# Patient Record
Sex: Female | Born: 1965 | Race: White | Hispanic: No | Marital: Married | Smoking: Former smoker
Health system: Southern US, Community
[De-identification: ages and names within clinical notes are randomized; demographics above are authoritative.]

## PROBLEM LIST (undated history)

## (undated) DIAGNOSIS — R000 Tachycardia, unspecified: Secondary | ICD-10-CM

## (undated) DIAGNOSIS — K760 Fatty (change of) liver, not elsewhere classified: Secondary | ICD-10-CM

## (undated) DIAGNOSIS — K219 Gastro-esophageal reflux disease without esophagitis: Secondary | ICD-10-CM

## (undated) DIAGNOSIS — G8929 Other chronic pain: Secondary | ICD-10-CM

## (undated) DIAGNOSIS — K2210 Ulcer of esophagus without bleeding: Secondary | ICD-10-CM

## (undated) DIAGNOSIS — F419 Anxiety disorder, unspecified: Secondary | ICD-10-CM

## (undated) DIAGNOSIS — L0390 Cellulitis, unspecified: Secondary | ICD-10-CM

## (undated) DIAGNOSIS — D649 Anemia, unspecified: Secondary | ICD-10-CM

## (undated) DIAGNOSIS — T7840XA Allergy, unspecified, initial encounter: Secondary | ICD-10-CM

## (undated) DIAGNOSIS — R51 Headache: Secondary | ICD-10-CM

## (undated) DIAGNOSIS — E669 Obesity, unspecified: Secondary | ICD-10-CM

## (undated) DIAGNOSIS — E119 Type 2 diabetes mellitus without complications: Secondary | ICD-10-CM

## (undated) DIAGNOSIS — M1990 Unspecified osteoarthritis, unspecified site: Secondary | ICD-10-CM

## (undated) DIAGNOSIS — J189 Pneumonia, unspecified organism: Secondary | ICD-10-CM

## (undated) DIAGNOSIS — N200 Calculus of kidney: Secondary | ICD-10-CM

## (undated) DIAGNOSIS — G709 Myoneural disorder, unspecified: Secondary | ICD-10-CM

## (undated) DIAGNOSIS — C801 Malignant (primary) neoplasm, unspecified: Secondary | ICD-10-CM

## (undated) DIAGNOSIS — R748 Abnormal levels of other serum enzymes: Secondary | ICD-10-CM

## (undated) DIAGNOSIS — K589 Irritable bowel syndrome without diarrhea: Secondary | ICD-10-CM

## (undated) DIAGNOSIS — K5792 Diverticulitis of intestine, part unspecified, without perforation or abscess without bleeding: Secondary | ICD-10-CM

## (undated) DIAGNOSIS — R519 Headache, unspecified: Secondary | ICD-10-CM

## (undated) DIAGNOSIS — E785 Hyperlipidemia, unspecified: Secondary | ICD-10-CM

## (undated) DIAGNOSIS — G932 Benign intracranial hypertension: Secondary | ICD-10-CM

## (undated) HISTORY — DX: Anemia, unspecified: D64.9

## (undated) HISTORY — PX: ANKLE SURGERY: SHX546

## (undated) HISTORY — DX: Allergy, unspecified, initial encounter: T78.40XA

## (undated) HISTORY — PX: ABDOMINAL HYSTERECTOMY: SHX81

## (undated) HISTORY — PX: TUBAL LIGATION: SHX77

## (undated) HISTORY — DX: Malignant (primary) neoplasm, unspecified: C80.1

## (undated) HISTORY — PX: FOOT SURGERY: SHX648

## (undated) HISTORY — PX: OTHER SURGICAL HISTORY: SHX169

## (undated) HISTORY — PX: TONSILLECTOMY: SUR1361

## (undated) HISTORY — DX: Gastro-esophageal reflux disease without esophagitis: K21.9

## (undated) HISTORY — DX: Cellulitis, unspecified: L03.90

## (undated) HISTORY — DX: Tachycardia, unspecified: R00.0

## (undated) HISTORY — PX: NASAL SEPTUM SURGERY: SHX37

## (undated) HISTORY — DX: Pneumonia, unspecified organism: J18.9

## (undated) HISTORY — DX: Anxiety disorder, unspecified: F41.9

## (undated) HISTORY — DX: Other chronic pain: G89.29

## (undated) HISTORY — DX: Calculus of kidney: N20.0

## (undated) HISTORY — PX: VAGINAL HYSTERECTOMY: SUR661

## (undated) HISTORY — DX: Obesity, unspecified: E66.9

## (undated) HISTORY — DX: Headache, unspecified: R51.9

## (undated) HISTORY — DX: Fatty (change of) liver, not elsewhere classified: K76.0

## (undated) HISTORY — DX: Headache: R51

## (undated) HISTORY — DX: Irritable bowel syndrome, unspecified: K58.9

## (undated) HISTORY — DX: Myoneural disorder, unspecified: G70.9

## (undated) HISTORY — DX: Ulcer of esophagus without bleeding: K22.10

## (undated) HISTORY — DX: Hyperlipidemia, unspecified: E78.5

## (undated) HISTORY — DX: Unspecified osteoarthritis, unspecified site: M19.90

## (undated) HISTORY — DX: Abnormal levels of other serum enzymes: R74.8

## (undated) HISTORY — DX: Type 2 diabetes mellitus without complications: E11.9

## (undated) HISTORY — DX: Diverticulitis of intestine, part unspecified, without perforation or abscess without bleeding: K57.92

---

## 1999-11-04 ENCOUNTER — Emergency Department (HOSPITAL_COMMUNITY): Admission: EM | Admit: 1999-11-04 | Discharge: 1999-11-04 | Payer: Self-pay | Admitting: Emergency Medicine

## 2000-02-04 ENCOUNTER — Emergency Department (HOSPITAL_COMMUNITY): Admission: EM | Admit: 2000-02-04 | Discharge: 2000-02-04 | Payer: Self-pay | Admitting: Emergency Medicine

## 2001-02-03 ENCOUNTER — Emergency Department (HOSPITAL_COMMUNITY): Admission: EM | Admit: 2001-02-03 | Discharge: 2001-02-04 | Payer: Self-pay | Admitting: Emergency Medicine

## 2001-11-05 ENCOUNTER — Emergency Department (HOSPITAL_COMMUNITY): Admission: EM | Admit: 2001-11-05 | Discharge: 2001-11-06 | Payer: Self-pay | Admitting: *Deleted

## 2004-12-18 ENCOUNTER — Emergency Department (HOSPITAL_COMMUNITY): Admission: EM | Admit: 2004-12-18 | Discharge: 2004-12-18 | Payer: Self-pay | Admitting: Emergency Medicine

## 2005-04-03 ENCOUNTER — Emergency Department (HOSPITAL_COMMUNITY): Admission: EM | Admit: 2005-04-03 | Discharge: 2005-04-03 | Payer: Self-pay | Admitting: Emergency Medicine

## 2006-11-06 ENCOUNTER — Emergency Department (HOSPITAL_COMMUNITY): Admission: EM | Admit: 2006-11-06 | Discharge: 2006-11-06 | Payer: Self-pay | Admitting: Emergency Medicine

## 2006-11-08 ENCOUNTER — Ambulatory Visit (HOSPITAL_COMMUNITY): Admission: RE | Admit: 2006-11-08 | Discharge: 2006-11-08 | Payer: Self-pay | Admitting: Emergency Medicine

## 2008-04-19 ENCOUNTER — Encounter: Payer: Self-pay | Admitting: Internal Medicine

## 2009-03-23 ENCOUNTER — Encounter: Payer: Self-pay | Admitting: Internal Medicine

## 2009-04-03 ENCOUNTER — Encounter: Payer: Self-pay | Admitting: Internal Medicine

## 2009-04-05 ENCOUNTER — Encounter (INDEPENDENT_AMBULATORY_CARE_PROVIDER_SITE_OTHER): Payer: Self-pay | Admitting: *Deleted

## 2009-04-10 ENCOUNTER — Encounter: Payer: Self-pay | Admitting: Internal Medicine

## 2009-05-23 ENCOUNTER — Encounter: Payer: Self-pay | Admitting: Internal Medicine

## 2009-05-29 ENCOUNTER — Ambulatory Visit: Payer: Self-pay | Admitting: Internal Medicine

## 2009-05-29 DIAGNOSIS — K589 Irritable bowel syndrome without diarrhea: Secondary | ICD-10-CM | POA: Insufficient documentation

## 2009-05-29 DIAGNOSIS — K7689 Other specified diseases of liver: Secondary | ICD-10-CM

## 2009-05-29 DIAGNOSIS — K219 Gastro-esophageal reflux disease without esophagitis: Secondary | ICD-10-CM

## 2009-06-09 ENCOUNTER — Ambulatory Visit: Payer: Self-pay | Admitting: Internal Medicine

## 2009-06-09 HISTORY — PX: OTHER SURGICAL HISTORY: SHX169

## 2009-06-09 LAB — CONVERTED CEMR LAB: Anti Nuclear Antibody(ANA): NEGATIVE

## 2009-06-13 LAB — CONVERTED CEMR LAB
ALT: 72 units/L — ABNORMAL HIGH (ref 0–35)
AST: 61 units/L — ABNORMAL HIGH (ref 0–37)
Albumin: 3.8 g/dL (ref 3.5–5.2)
Alkaline Phosphatase: 73 units/L (ref 39–117)
Bilirubin, Direct: 0.1 mg/dL (ref 0.0–0.3)
Ferritin: 67.3 ng/mL (ref 10.0–291.0)
Total Bilirubin: 0.6 mg/dL (ref 0.3–1.2)
Total Protein: 6.7 g/dL (ref 6.0–8.3)

## 2009-06-19 HISTORY — PX: COLONOSCOPY: SHX174

## 2009-08-11 ENCOUNTER — Ambulatory Visit: Payer: Self-pay | Admitting: Internal Medicine

## 2010-06-03 ENCOUNTER — Encounter: Payer: Self-pay | Admitting: Emergency Medicine

## 2010-06-12 NOTE — Letter (Signed)
Summary: [facility] Instructions  [facility]  [place], [place] [address]   Phone: [phone]  Fax: [phone]       BLAYRE PAPANIA    [birth year]    MRN: [id number]        Procedure Day /Date:FRIDAY 06/09/2009     Arrival Time:1PM     Procedure Time:2PM     Location of Procedure:                    X   [facility] (4th Floor)                        PREPARATION FOR COLONOSCOPY WITH MOVIPREP/EGD   Starting 5 days prior to your procedure 06/04/2009 do not eat nuts, seeds, popcorn, corn, beans, peas,  salads, or any raw vegetables.  Do not take any fiber supplements (e.g. Metamucil, Citrucel, and Benefiber).  THE DAY BEFORE YOUR PROCEDURE         DATE: 06/08/2009  DAY: THURSDAY  1.  Drink clear liquids the entire day-NO SOLID FOOD  2.  Do not drink anything colored red or purple.  Avoid juices with pulp.  No orange juice.  3.  Drink at least 64 oz. (8 glasses) of fluid/clear liquids during the day to prevent dehydration and help the prep work efficiently.  CLEAR LIQUIDS INCLUDE: Water Jello Ice Popsicles Tea (sugar ok, no milk/cream) Powdered fruit flavored drinks Coffee (sugar ok, no milk/cream) Gatorade Juice: apple, white grape, white cranberry  Lemonade Clear bullion, consomm, broth Carbonated beverages (any kind) Strained chicken noodle soup Hard Candy                             4.  In the morning, mix first dose of MoviPrep solution:    Empty 1 Pouch A and 1 Pouch B into the disposable container    Add lukewarm drinking water to the top line of the container. Mix to dissolve    Refrigerate (mixed solution should be used within 24 hrs)  5.  Begin drinking the prep at 5:00 p.m. The MoviPrep container is divided by 4 marks.   Every 15 minutes drink the solution down to the next mark (approximately 8 oz) until the full liter is complete.   6.  Follow completed prep with 16 oz of clear liquid of your choice (Nothing red  or purple).  Continue to drink clear liquids until bedtime.  7.  Before going to bed, mix second dose of MoviPrep solution:    Empty 1 Pouch A and 1 Pouch B into the disposable container    Add lukewarm drinking water to the top line of the container. Mix to dissolve    Refrigerate  THE DAY OF YOUR PROCEDURE      DATE: 06/09/2009 DAY: FRIDAY  Beginning at 9a.m. (5 hours before procedure):         1. Every 15 minutes, drink the solution down to the next mark (approx 8 oz) until the full liter is complete.  2. Follow completed prep with 16 oz. of clear liquid of your choice.    3. You may drink clear liquids until 12:00PM (2 HOURS BEFORE PROCEDURE).   MEDICATION INSTRUCTIONS  Unless otherwise instructed, you should take regular prescription medications with a small sip of water   as early as possible the morning of your procedure.  OTHER INSTRUCTIONS  You will need a responsible adult at least 43 years of age to accompany you and drive you home.   This person must remain in the waiting room during your procedure.  Wear loose fitting clothing that is easily removed.  Leave jewelry and other valuables at home.  However, you may wish to bring a book to read or  an iPod/MP3 player to listen to music as you wait for your procedure to start.  Remove all body piercing jewelry and leave at home.  Total time from sign-in until discharge is approximately 2-3 hours.  You should go home directly after your procedure and rest.  You can resume normal activities the  day after your procedure.  The day of your procedure you should not:   Drive   Make legal decisions   Operate machinery   Drink alcohol   Return to work  You will receive specific instructions about eating, activities and medications before you leave.    The above instructions have been reviewed and explained to me by   _______________________    I fully understand and can verbalize these  instructions _____________________________ Date _________

## 2010-06-12 NOTE — Miscellaneous (Signed)
Summary: Orders Update  Clinical Lists Changes  Orders: Added new Test order of TLB-Hepatic/Liver Function Pnl (80076-HEPATIC) - Signed Added new Test order of TLB-Ferritin (82728-FER) - Signed Added new Test order of T-ANA ([id number]) - Signed

## 2010-06-12 NOTE — Letter (Signed)
Summary: [facility]   Imported By: Phillis Knack 05/31/2009 08:16:38  _____________________________________________________________________  External Attachment:    Type:   Image     Comment:   External Document

## 2010-06-12 NOTE — Assessment & Plan Note (Signed)
Summary: follow up EGD/sheri   History of Present Illness Visit Type: Follow-up Visit Primary GI MD: Silvano Rusk MD [facility] Primary Provider: Clayborn Heron Requesting Provider: Clayborn Heron Chief Complaint: GERD, IBS, Fatty Liver History of Present Illness:   Bowels are better. passed a kidney stone 1-2 weeks ago Compliant with medications and is eating a high fiber diet now. She is also now on Zocor for hyperlipidemia. She has lost 6#. Some coughing spells in the morning and she will regurgitate and even vomit small amounts of bilious fluid. She had to take blocks down because husband slipped out of bed. She plans to look for a wedge. She is taking Nexium before breakfast and after supper about 2 hours before bedtime.   GI Review of Systems    Reports acid reflux and  vomiting.      Denies abdominal pain, belching, bloating, chest pain, dysphagia with liquids, dysphagia with solids, heartburn, loss of appetite, nausea, vomiting blood, weight loss, and  weight gain.        Denies anal fissure, black tarry stools, change in bowel habit, constipation, diarrhea, diverticulosis, fecal incontinence, heme positive stool, hemorrhoids, irritable bowel syndrome, jaundice, light color stool, liver problems, rectal bleeding, and  rectal pain.    Current Medications (verified): 1)  Levbid 0.375 Mg  Tb12 (Hyoscyamine Sulfate) .... Take 1 Tablet By Mouth Two Times A Day As Needed 2)  Sumatriptan Succinate 100 Mg Tabs (Sumatriptan Succinate) .... Take 1/2 To 1 Tablet By Mouth Every 2 Hours Prn 3)  Fioricet 50-325-40 Mg Tabs (Butalbital-Apap-Caffeine) .... As Needed 4)  Aspir-Low 81 Mg Tbec (Aspirin) .... Once Daily 5)  Flax Seed Oil 1000 Mg Caps (Flaxseed (Linseed)) .... Once Daily 6)  Nexium 40 Mg Cpdr (Esomeprazole Magnesium) .Marland Kitchen.. 1 By Mouth Two Times A Day 7)  Zocor 40 Mg Tabs (Simvastatin) .Marland Kitchen.. 1 By Mouth Once Daily  Allergies (verified): 1)  ! * Anaprox  Past History:  Past Medical  History: GERD Anemia Anxiety Disorder Arthritis Asthma Chronic Headaches Diverticulosis Hyperlipidemia Irritable Bowel Syndrome Obesity Pneumonia Urinary Tract Infection Kidney Stones Fatty Liver  Past Surgical History: Reviewed history from 05/29/2009 and no changes required. Tonsillectomy Tubal Ligation Ovarian cysts x3 foot surgery Ankle surgery Deviated septum  Family History: Reviewed history from 05/29/2009 and no changes required. Family History of Colitis/Crohn's: Family History of Breast Cancer: Family History of Ovarian Cancer: Family History of Celiac Disease: Family History of Clotting disorder:  Family History of Cystic Fibrosis:  Family History of Diabetes:  Family History of Heart Disease:  Family History of Irritable Bowel Syndrome: Family History of Kidney Disease: No FH of Colon Cancer:  Social History: Reviewed history from 05/29/2009 and no changes required. 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  Alcohol Use - no Daily Caffeine Use Illicit Drug Use - no  Review of Systems       The patient complains of back pain, blood in urine, cough, and urination changes/pain.  The patient denies allergy/sinus, anemia, anxiety-new, arthritis/joint pain, breast changes/lumps, change in vision, confusion, coughing up blood, depression-new, fainting, fatigue, fever, headaches-new, hearing problems, heart murmur, heart rhythm changes, itching, menstrual pain, muscle pains/cramps, night sweats, nosebleeds, pregnancy symptoms, shortness of breath, skin rash, sleeping problems, sore throat, swelling of feet/legs, swollen lymph glands, thirst - excessive , urination - excessive , urine leakage, vision changes, and voice change.    Vital Signs:  Patient profile:   45 year old female Height:      62 inches Weight:  185 pounds BMI:     33.96 Pulse rate:   144 / minute Pulse rhythm:   regular BP sitting:   130 / 98  (left arm)  Vitals Entered By:  Randye Lobo NCMA (August 11, 2009 1:25 PM)  Physical Exam  General:  obese.  NAD   Impression & Recommendations:  Problem # 1:  GERD (ICD-530.81) Assessment Improved adjust Nexium to bid before meals and raise HOB again, PPI indefnitely if still coughing then to return that might be GERD but notcertain continue weight loss  Problem # 2:  IRRITABLE BOWEL SYNDROME (ICD-564.1) Assessment: Improved High Fiber diet and hyoscyamine are working  Problem # 3:  FATTY LIVER DISEASE (ICD-571.8) Assessment: Unchanged continue Zocor even if LFT's up 2-3x as this has been shown to help continue to lose weight  Patient Instructions: 1)  Take your Nexium 30-60 minutes before breakfast and supper. 2)  Try to elevate head of bed again, 4-6 inches. 3)  Please schedule a follow-up appointment as needed. If you continue to cough at night  after 2 months then return to Dr. Carlean Purl. 4)  Copy sent to : Margaretmary Eddy, MD 5)  The medication list was reviewed and reconciled.  All changed / newly prescribed medications were explained.  A complete medication list was provided to the patient / caregiver.

## 2010-06-12 NOTE — Procedures (Signed)
Summary: Upper Endoscopy w/DIL  Patient: Jacqueline [name] Note: All result statuses are Final unless otherwise noted.  Tests: (1) Upper Endoscopy w/DIL (UED)  UED Upper Endoscopy w/DIL                             [facility].     [place], [place]  [address]           ENDOSCOPY PROCEDURE REPORT           PATIENT:  [name], [name]  MR#:  [id number]     BIRTHDATE:  [birth year], 43 yrs. old  GENDER:  female           ENDOSCOPIST:  Gatha Mayer, MD, [facility]           PROCEDURE DATE:  06/09/2009     PROCEDURE:  EGD with biopsy, Venia Minks Dilation of the Esophagus     ASA CLASS:  Class II     INDICATIONS:  1) dysphagia           MEDICATIONS:   Fentanyl 75 mcg IV, Versed 7 mg IV     TOPICAL ANESTHETIC:  Exactacain Spray           DESCRIPTION OF PROCEDURE:   After the risks benefits and     alternatives of the procedure were thoroughly explained, informed     consent was obtained.  The LB GIF-H180 [id number] endoscope was     introduced through the mouth and advanced to the second portion of     the duodenum, without limitations.  The instrument was slowly     withdrawn as the mucosa was carefully examined.     <<PROCEDUREIMAGES>>           An ulcer was found in the distal esophagus. 6-7 mm ulcer just     above Z-line (at 36 cm). With standard forceps, a biopsy was     obtained and sent to pathology.  Edema was found at the     gastroesophageal junction. Proximal gastric folds are edematous.     Moderate gastritis was found in the total stomach. Mottled,     erythematous mucosa with red spots, proximal>distal.  The     examination was otherwise normal.    Dilation was then performed     at the distal esophagus           1) Dilator:  Venia Minks  Size(s):  54 french     Resistance:  minimal  Heme:  yes           there was a transient suggestion of a ring and given that and     impact dysphagia, dilation performed     trace heme, distal esophagus had been  biopsied           COMPLICATIONS:  None           ENDOSCOPIC IMPRESSION:     1) Ulcer in the distal esophagus     2) Edema at the gastroesophageal junction           These findings most consistent with reflux esophagitis, await     pathology.           3) Moderate gastritis in the total stomach     4) Otherwise normal examination.           Maloney dilation performed due to dysphagia  and ? of ring (seen     transiently?)           RECOMMENDATIONS:     1) await biopsy results     Clear liquids until 4PM then soft diet. Normal diet tomorrow.     She may need to chnage PPI as has esophagitis despite bid     Nexium.     See colonoscopy report from today also.           REPEAT EXAM:  await pathology to determine need           Gatha Mayer, MD, [facility]           CC:  Jenna Luo, M.D.     The Patient,           n.     eSIGNED:   Gatha Mayer at 06/09/2009 02:51 PM           Dorice Lamas, [id number]  Note: An exclamation mark (!) indicates a result that was not dispersed into the flowsheet. Document Creation Date: 06/09/2009 2:51 PM _______________________________________________________________________  (1) Order result status: Final Collection or observation date-time: 06/09/2009 14:22 Requested date-time:  Receipt date-time:  Reported date-time:  Referring Physician:   Ordering Physician: Silvano Rusk [phone]) Specimen Source:  Source: Tawanna Cooler Order Number: [id number] Lab site:   Appended Document: Upper Endoscopy w/DIL she has not been totally compliant with Nexium per conversation in recovery

## 2010-06-12 NOTE — Assessment & Plan Note (Signed)
Summary: STOMACH PROBLEMS/ON&OFF DIARRHEA/FX HX OF CROHNS/YF   History of Present Illness Visit Type: Initial Consult Primary GI MD: Silvano Rusk MD [facility] Primary Provider: Clayborn Heron Requesting Provider: Clayborn Heron Chief Complaint: loose bowels History of Present Illness:   Alternating bowel habits very loose and urgent to "hard rocks".This has occured over past few months. Stools often loose after intial hard defecation. Tried Benefiber, helped a little. 3 doses - 1-2 tsp/day.  Weight down some and appetite off. Food, especially dry foods will stick in her esophagusa and only way to get relief will be to regurgitate. Alot of bloating. In 2004 or 2005 she was constipated, had an endoscopy and "pockets in small intesties", was given Zelnorm which helped constipation. Nexium controls heartburn most of the time. Hyoscyamine is helping her current symptoms somewhat. She is concered due to aunt and cousin with Crohn's. Hoarseness, ? from GERD. Also with elevated LFT's and fatty liver on ultrasound. She denies anxiety "in the past not now".   GI Review of Systems    Reports abdominal pain, acid reflux, belching, bloating, chest pain, dysphagia with liquids, dysphagia with solids, loss of appetite, nausea, vomiting, and  weight loss.      Denies heartburn, vomiting blood, and  weight gain.      Reports change in bowel habits, constipation, diarrhea, diverticulosis, and  irritable bowel syndrome.     Denies anal fissure, black tarry stools, fecal incontinence, heme positive stool, hemorrhoids, jaundice, light color stool, liver problems, rectal bleeding, and  rectal pain. Preventive Screening-Counseling & Management  Alcohol-Tobacco     Smoking Status: quit      Drug Use:  no.      Current Medications (verified): 1)  Levbid 0.375 Mg  Tb12 (Hyoscyamine Sulfate) .... Take 1 Tablet By Mouth Two Times A Day As Needed 2)  Nexium 40 Mg Cpdr (Esomeprazole Magnesium) .... Two Times A  Day 3)  Sumatriptan Succinate 100 Mg Tabs (Sumatriptan Succinate) .... Take 1/2 To 1 Tablet By Mouth Every 2 Hours Prn 4)  Fioricet 50-325-40 Mg Tabs (Butalbital-Apap-Caffeine) .... As Needed 5)  Aspir-Low 81 Mg Tbec (Aspirin) .... Once Daily 6)  Flax Seed Oil 1000 Mg Caps (Flaxseed (Linseed)) .... Once Daily  Allergies (verified): 1)  ! * Anaprox  Past History:  Past Medical History: GERD Anemia Anxiety Disorder Arthritis Asthma Chronic Headaches Diverticulosis Hyperlipidemia Irritable Bowel Syndrome Obesity Pneumonia Urinary Tract Infection  Past Surgical History: Tonsillectomy Tubal Ligation Ovarian cysts x3 foot surgery Ankle surgery Deviated septum  Family History: Family History of Colitis/Crohn's: Family History of Breast Cancer: Family History of Ovarian Cancer: Family History of Celiac Disease: Family History of Clotting disorder:  Family History of Cystic Fibrosis:  Family History of Diabetes:  Family History of Heart Disease:  Family History of Irritable Bowel Syndrome: Family History of Kidney Disease: No FH of Colon Cancer:  Social History: 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  Alcohol Use - no Daily Caffeine Use Illicit Drug Use - no Smoking Status:  quit Drug Use:  no  Review of Systems       The patient complains of allergy/sinus, arthritis/joint pain, back pain, cough, fatigue, muscle pains/cramps, nosebleeds, thirst - excessive, and voice change.         All other ROS negative except as per HPI.   Vital Signs:  Patient profile:   45 year old female Height:      62 inches Weight:      191 pounds BMI:  35.06 Pulse rate:   72 / minute Pulse rhythm:   regular BP sitting:   132 / 84  (left arm) Cuff size:   regular  Vitals Entered By: June McMurray [place] Deborra Medina) (May 29, 2009 1:49 PM)  Physical Exam  General:  obese.   Eyes:  PERRLA, no icterus. Mouth:  No deformity or lesions, dentition normal. Neck:   Supple; no masses or thyromegaly. Lungs:  Clear throughout to auscultation. Heart:  Regular rate and rhythm; no murmurs, rubs,  or bruits. Abdomen:  obese, striae, soft and mildly tender in epigastrium BS+, no palpable HSM Rectal:  deferred until time of colonoscopy.   Extremities:  no edema Neurologic:  Alert and  oriented x4; Cervical Nodes:  No significant cervical or supraclavicular adenopathy.  Psych:  Alert and cooperative. Normal mood and affect.   Impression & Recommendations:  Problem # 1:  CHANGE IN BOWELS (PJK-932.67) Assessment New Alternating bowel habits causing severe symptoms. Most likely IBS. Crohn's/IBD is in differential. ESR was mildly elevated in Nov. CBC ok. TTG Ab, IgA antigliadin  negative and IgA ok She has some family members (not close) with Crohn's. Has carried a dx of IBS x yrs. Fiber somewhat helpful but not much. Depending upon colonoscopy results, therapy may be MiraLax and continue hyoscyamine which helps.  Risks, benefits,and indications of endoscopic procedure(s) were reviewed with the patient and all questions answered.  Orders: Colon/Endo (Colon/Endo)  Problem # 2:  DYSPHAGIA (TIW-580.99) Assessment: New Has GERD ? Peptic stricture and/or active esophagitis - we have discussed possible esophagea dilation hoarseness and some heartburn despite Nexium Risks, benefits,and indications of endoscopic procedure(s) were reviewed with the patient and all questions answered.  Orders: Colon/Endo (Colon/Endo)  Problem # 3:  TRANSAMINASES, SERUM, ELEVATED (ICD-790.4) seems likely due to fatty liver AST 55 ALT 94 (results in after visit) with NL bili, alk phos acute hepatitis panel negative 11/11 [place] report reviewed 04/10/09 with enlarged fatty liver will reassess after endoscopic eval will need to discuss weight loss regardless (multiple benefits)  Patient Instructions: 1)  Your EGD/Colonoscopy is scheduled for 06/09/2009 at 2pm 2)  You can pick up  your MoviPrep from your pharmacy today alon with your Nexium prescription. 3)  We are requesting previous labs fom [place] 4)  The medication list was reviewed and reconciled.  All changed / newly prescribed medications were explained.  A complete medication list was provided to the patient / caregiver. Prescriptions: MOVIPREP 100 GM  SOLR (PEG-KCL-NACL-NASULF-NA ASC-C) As per prep instructions.  #1 x 0   Entered by:   Genella Mech CMA (AAMA)   Authorized by:   Gatha Mayer MD, [facility]   Signed by:   Genella Mech CMA ([place]) on 05/29/2009   Method used:   Electronically to        [facility]. [phone]* (retail)       [address], [place]  [address]       Ph: [phone]       Fax: [phone]   RxID:   [id number] NEXIUM 40 MG CPDR (ESOMEPRAZOLE MAGNESIUM) 1 by mouth two times a day  #60 x 3   Entered by:   Genella Mech CMA ([place])   Authorized by:   Gatha Mayer MD, [facility]   Signed by:   Genella Mech CMA ([place]) on 05/29/2009   Method used:   Electronically to        [facility]  [address] [phone]* (retail)       [address] [place]  [address]       Ph: [phone]       Fax: [phone]   RxID:   [id number]

## 2010-06-12 NOTE — Procedures (Signed)
Summary: Colonoscopy  Patient: Jacqueline [name] Note: All result statuses are Final unless otherwise noted.  Tests: (1) Colonoscopy (COL)   COL Colonoscopy           [facility].     [place], [place]  [address]           COLONOSCOPY PROCEDURE REPORT           PATIENT:  Jacqueline, [name]  MR#:  [id number]     BIRTHDATE:  07-10-1965, 43 yrs. old  GENDER:  female           ENDOSCOPIST:  Gatha Mayer, MD, [facility]     Referred by:  Jenna Luo, M.D.           PROCEDURE DATE:  06/09/2009     PROCEDURE:  Colonoscopy 544[address]     ASA CLASS:  Class II     INDICATIONS:  change in bowel habits           MEDICATIONS:   There was residual sedation effect present from     prior procedure., Fentanyl 25 mcg IV, Versed 5 mg           DESCRIPTION OF PROCEDURE:   After the risks benefits and     alternatives of the procedure were thoroughly explained, informed     consent was obtained.  Digital rectal exam was performed and     revealed no abnormalities.   The LB CF-H180AL [id number] endoscope     was introduced through the anus and advanced to the terminal ileum     which was intubated for a short distance, without limitations.     The quality of the prep was excellent, using MoviPrep.  The     instrument was then slowly withdrawn as the colon was fully     examined.     Insertion: 2:50 minutes, Withdrawal: 7:00 minutes     <<PROCEDUREIMAGES>>     FINDINGS:  Mild diverticulosis was found in the sigmoid colon.     This was otherwise a normal examination of the colon. Terminal     ileum was normal also.   Retroflexed views in the rectum revealed     no abnormalities.    The scope was then withdrawn from the pa     tient and the procedure completed.           COMPLICATIONS:  None           ENDOSCOPIC IMPRESSION:     1) Mild diverticulosis in the sigmoid colon     2) Otherwise normal examination, excellent prep     3) No evidence of Crohn's. I think problems are due  to Irritable     Bowle Syndrome.           RECOMMENDATIONS:     Take MiraLax (17 grams or 1 tablespoon) in 8 oz liquid daily.     Continue other medications.           Labs today LFT's, ANA, ferrtin re: abnormal LFT's           REPEAT EXAM:  In 10 year(s) for routine screening colonocsopy.           Gatha Mayer, MD, [facility]           CC:  Jenna Luo, MD     The Patient           n.  eSIGNED:   Gatha Mayer at 06/09/2009 03:02 PM           Dorice Lamas, [id number]  Note: An exclamation mark (!) indicates a result that was not dispersed into the flowsheet. Document Creation Date: 06/09/2009 3:02 PM _______________________________________________________________________  (1) Order result status: Final Collection or observation date-time: 06/09/2009 14:40 Requested date-time:  Receipt date-time:  Reported date-time:  Referring Physician:   Ordering Physician: Silvano Rusk [id number]) Specimen Source:  Source: Tawanna Cooler Order Number: [id number] Lab site:   Appended Document: Colonoscopy    Clinical Lists Changes  Observations: Added new observation of COLONNXTDUE: 05/2019 (06/09/2009 15:20)

## 2010-11-13 ENCOUNTER — Ambulatory Visit
Admission: RE | Admit: 2010-11-13 | Discharge: 2010-11-13 | Disposition: A | Discharge status: Home / Self Care | Payer: Medicare Other | Source: Ambulatory Visit | Attending: Family Medicine | Admitting: Family Medicine

## 2010-11-13 ENCOUNTER — Other Ambulatory Visit: Payer: Self-pay | Admitting: Family Medicine

## 2010-11-13 DIAGNOSIS — R1031 Right lower quadrant pain: Secondary | ICD-10-CM

## 2010-11-13 MED ORDER — IOHEXOL 300 MG/ML  SOLN: 100.0000 mL | Freq: Once | INTRAMUSCULAR | Status: AC | PRN

## 2011-02-27 LAB — URINALYSIS, ROUTINE W REFLEX MICROSCOPIC
Bilirubin Urine: NEGATIVE
Glucose, UA: NEGATIVE
Ketones, ur: NEGATIVE
Leukocytes, UA: NEGATIVE
Nitrite: NEGATIVE
Specific Gravity, Urine: >1.03 — ABNORMAL HIGH
pH: 6

## 2011-02-27 LAB — CBC
Hemoglobin: 14
MCHC: 34.7
MCV: 87.2
RBC: 4.63
RDW: 12.9

## 2011-02-27 LAB — COMPREHENSIVE METABOLIC PANEL
BUN: 12
CO2: 26
Calcium: 9
Creatinine, Ser: 0.77
GFR calc non Af Amer: >60
Glucose, Bld: 116 — ABNORMAL HIGH
Sodium: 138
Total Protein: 6.5

## 2011-02-27 LAB — LIPASE, BLOOD: Lipase: 29

## 2011-02-27 LAB — DIFFERENTIAL
Eosinophils Absolute: 0
Lymphocytes Relative: 30
Lymphs Abs: 3.3
Monocytes Relative: 7
Neutro Abs: 6.8
Neutrophils Relative %: 63

## 2011-02-27 LAB — URINE MICROSCOPIC-ADD ON

## 2011-02-27 LAB — POCT CARDIAC MARKERS
CKMB, poc: 3.6
CKMB, poc: 6

## 2011-02-27 LAB — D-DIMER, QUANTITATIVE: D-Dimer, Quant: 0.24

## 2011-02-27 LAB — TROPONIN I: Troponin I: 0.02

## 2011-02-27 LAB — CK TOTAL AND CKMB (NOT AT ARMC)
CK, MB: 1.1
Relative Index: INVALID

## 2011-08-29 ENCOUNTER — Other Ambulatory Visit (HOSPITAL_COMMUNITY): Payer: Self-pay | Admitting: *Deleted

## 2011-08-29 ENCOUNTER — Encounter (HOSPITAL_COMMUNITY)
Admission: RE | Admit: 2011-08-29 | Discharge: 2011-08-29 | Disposition: A | Discharge status: Home / Self Care | Payer: Medicare Other | Source: Ambulatory Visit | Attending: Family Medicine | Admitting: Family Medicine

## 2011-08-29 NOTE — Progress Notes (Signed)
Plebotomized 548m blood from left a/c without difficulty. Tolerated well. Patient drinking po's pre and post procedure.

## 2011-09-10 ENCOUNTER — Ambulatory Visit (INDEPENDENT_AMBULATORY_CARE_PROVIDER_SITE_OTHER): Payer: Medicare Other | Admitting: Internal Medicine

## 2011-09-10 ENCOUNTER — Encounter: Payer: Self-pay | Admitting: Internal Medicine

## 2011-09-10 VITALS — BP 106/72 | HR 88 | Ht 62.0 in | Wt 192.0 lb

## 2011-09-10 DIAGNOSIS — K625 Hemorrhage of anus and rectum: Secondary | ICD-10-CM

## 2011-09-10 DIAGNOSIS — K760 Fatty (change of) liver, not elsewhere classified: Secondary | ICD-10-CM

## 2011-09-10 DIAGNOSIS — K7689 Other specified diseases of liver: Secondary | ICD-10-CM

## 2011-09-10 DIAGNOSIS — R7989 Other specified abnormal findings of blood chemistry: Secondary | ICD-10-CM

## 2011-09-10 DIAGNOSIS — R748 Abnormal levels of other serum enzymes: Secondary | ICD-10-CM

## 2011-09-10 NOTE — Patient Instructions (Signed)
We will obtain the lab results from [facility].  If you have not heard from [place] in 3 weeks, please call our office at [phone]  We have given you some information on fatty liver to review

## 2011-09-10 NOTE — Progress Notes (Addendum)
Subjective:    Patient ID: Jacqueline [name], female    DOB: 1965-09-10, 46 y.o.   MRN: [id number]  HPI This pleasant 46 year old white woman is known to me from prior evaluation previously, was found to have fatty liver and also IBS. She had not been seen since 2011. More recently she was having problems with palpitations. She presented for evaluation her primary care provider and laboratory testing revealed elevated transaminases with an AST of 254 and an ALT of 189 on March 22. Further evaluation led to serologic workup an elevated ferritin of 705. Dr. Dennard Schaumann was appropriately concerned about the possibility of hemochromatosis. He wanted her evaluated for this possibility. According to the patient she has had a unit of blood flow by my, and just today had repeat liver tests and a hemochromatosis gene test. Her ANA is negative, her iron saturation is 33%. Antimitochondrial antibody IgG is negative.  In the past she had an acute hepatitis serology panel that was negative.  She also had diarrhea, that was transient and several days of bright red blood on the tissue paper only slight blood related to the diarrhea, she was diagnosed with diabetes mellitus recently and started on metformin and had some diarrhea when she initiated that. That has now resolved, both the diarrhea and the bleeding.  She is trying to avoid iron rich foods at this point and also tried with that to a diabetic diet and is struggling with that a little bit. Her mother has type 2 diabetes, her son was diagnosed with type I.5 diabetes in his 61s.  Medications, allergies, past medical history, past surgical history, family history and social history are reviewed and updated in the EMR.   Review of Systems As above.    Objective:   Physical Exam General:  NAD, obese Eyes:   anicteric Lungs:  clear Heart:  S1S2 no rubs, murmurs or gallops Abdomen:  soft and BS+, there are striae present. There is abdominal obesity present.  There is some tenderness in the right upper quadrant in the liver edge is palpable fingerbreadth below the costal margin. Ext:   no edema    Data Reviewed:   As per history of present illness, white count was 11, hemoglobin 14.7 platelets 391 on March 22. TSH was normal.       Assessment & Plan:   1. Elevated ferritin   I suspect this is most likely related to fatty liver disease. Await results of hemochromatosis gene testing, we have requested these from Dr. Dennard Schaumann. If that is positive a liver biopsy would be appropriate. Once I review the results, regardless of what they were we will have her come back for followup.   2. Fatty liver   I think it's fairly clear she has this, the question is how much inflammatory component. The rise in her transaminases suggest a flare of this. It does not seem to be from her medications as I review them. She denies symptoms compatible with acute infectious hepatitis, and acute hepatitis panel was negative in 2011. She could need repeat serologic testing for completeness but would hold on that now and await review of repeat LFTs that were drawn yesterday according to the patient. I have explained her weight loss is the only proven treatment. She understands this and is trying to a depth her new diagnosis of diabetes and will lose weight. More formal dietary consultation would be appropriate in the future.   3. Abnormal transaminases   Now 6-9 times abnormal, in  the past only 2-3 times abnormal. Probably fatty liver, await repeat studies, consider additional testing. A liver biopsy may be necessary to further understand the increase in levels.   4. Rectal bleeding   Self-limited and associated with diarrhea, no obvious hemorrhoids on rectal exam today, no mass. Stool was brown. Given that she had a colonoscopy in 2011, though no hemorrhoids were seen, I do believe she had anorectal eructation and no further workup is needed at this point.    CC:  PICKARD,WARREN TOM, MD   Records Review  08/23/2011 AST 206 and ALT 141 09/10/2011 AST 128 and ALT 163  Will confirm if she had hemochromatosis gene testing Repeat LFT's in 2 weeks.  Also note that ferritin down to 154 after 1 phlebotomy

## 2011-09-17 ENCOUNTER — Other Ambulatory Visit: Payer: Self-pay

## 2011-09-23 ENCOUNTER — Telehealth: Payer: Self-pay

## 2011-09-23 NOTE — Telephone Encounter (Signed)
Message copied by Marlon Pel on Mon Sep 23, 2011  3:56 PM ------      Message from: Silvano Rusk E      Created: Mon Sep 23, 2011  9:24 AM      Regarding: RE: LFT's and ? hemochromatosis gene test       Please have her do Hemochromatosis gene test re: elevated ferritin      ----- Message -----         From: Kellie Moor, RN,CGRN         Sent: 09/17/2011   3:49 PM           To: Gatha Mayer, MD      Subject: RE: LFT's and ? hemochromatosis gene test                I will put all labs in the folder in your office.  She did not have any hemochromatosis gene testing done.        ----- Message -----         From: Gatha Mayer, MD         Sent: 09/13/2011   5:07 PM           To: Kellie Moor, RN,CGRN      Subject: LFT's and ? hemochromatosis gene test                    1) Please ask [facility] if they sent a hemochromatosis gene test ? (Dr. Dennard Schaumann)      2) Have her repeat LFTs on/about May 14 re fatty liver and abnormal transaminases

## 2011-09-23 NOTE — Telephone Encounter (Signed)
Patient advised she will come for repeat labs and hemochromatosis DNA this week

## 2011-09-24 ENCOUNTER — Other Ambulatory Visit (INDEPENDENT_AMBULATORY_CARE_PROVIDER_SITE_OTHER): Payer: Medicare Other

## 2011-09-24 DIAGNOSIS — R7989 Other specified abnormal findings of blood chemistry: Secondary | ICD-10-CM

## 2011-09-24 LAB — HEPATIC FUNCTION PANEL
ALT: 159 U/L — ABNORMAL HIGH (ref 0–35)
Alkaline Phosphatase: 90 U/L (ref 39–117)
Bilirubin, Direct: 0.1 mg/dL (ref 0.0–0.3)
Total Bilirubin: 0.2 mg/dL — ABNORMAL LOW (ref 0.3–1.2)
Total Protein: 8.5 g/dL — ABNORMAL HIGH (ref 6.0–8.3)

## 2011-10-01 NOTE — Progress Notes (Signed)
Quick Note:  LFT's still elevated though not severe Have her do an ANA, anti-smooth muscle antibody, ceruloplasmin and the hemochromatosis gene test ______

## 2011-10-02 ENCOUNTER — Other Ambulatory Visit: Payer: Self-pay

## 2011-10-02 NOTE — Progress Notes (Signed)
Quick Note:  Please have her do the tests EXCEPT hemochromatosis gene test.  She does not have hereditary hemochromatosis  Also ask her to arrange a follow-up visit for 4-6 weeks    ______

## 2011-10-04 ENCOUNTER — Other Ambulatory Visit: Payer: Medicare Other

## 2011-10-08 NOTE — Progress Notes (Signed)
Quick Note:  These tests for inherited (ceruloplasmin) or acquired (autoimmune) liver problems are negative. Will discuss more at follow-up visit. ______

## 2011-11-18 ENCOUNTER — Other Ambulatory Visit (INDEPENDENT_AMBULATORY_CARE_PROVIDER_SITE_OTHER): Payer: Medicare Other

## 2011-11-18 ENCOUNTER — Ambulatory Visit (INDEPENDENT_AMBULATORY_CARE_PROVIDER_SITE_OTHER): Payer: Medicare Other | Admitting: Internal Medicine

## 2011-11-18 ENCOUNTER — Encounter: Payer: Self-pay | Admitting: Internal Medicine

## 2011-11-18 VITALS — BP 132/86 | HR 100 | Ht 62.0 in | Wt 187.0 lb

## 2011-11-18 DIAGNOSIS — K7689 Other specified diseases of liver: Secondary | ICD-10-CM

## 2011-11-18 DIAGNOSIS — K760 Fatty (change of) liver, not elsewhere classified: Secondary | ICD-10-CM

## 2011-11-18 DIAGNOSIS — E119 Type 2 diabetes mellitus without complications: Secondary | ICD-10-CM | POA: Insufficient documentation

## 2011-11-18 DIAGNOSIS — E669 Obesity, unspecified: Secondary | ICD-10-CM

## 2011-11-18 DIAGNOSIS — IMO0002 Reserved for concepts with insufficient information to code with codable children: Secondary | ICD-10-CM | POA: Insufficient documentation

## 2011-11-18 DIAGNOSIS — G43709 Chronic migraine without aura, not intractable, without status migrainosus: Secondary | ICD-10-CM | POA: Insufficient documentation

## 2011-11-18 LAB — HEPATIC FUNCTION PANEL
ALT: 96 U/L — ABNORMAL HIGH (ref 0–35)
AST: 73 U/L — ABNORMAL HIGH (ref 0–37)
Alkaline Phosphatase: 73 U/L (ref 39–117)
Total Bilirubin: 0.4 mg/dL (ref 0.3–1.2)

## 2011-11-18 NOTE — Progress Notes (Signed)
  Subjective:    Patient ID: Jacqueline [name], female    DOB: [birth year], 46 y.o.   MRN: [id number]  HPI Patient returns for followup of abnormal transaminases presumed to be due to fatty liver disease. She reports some fleeting right upper quadrant pains are sharp in worse with inspiration at times. They last for seconds only. She also reports that her headaches are under fairly good control, where she is to have daily headaches over the years this is improved only about once a week.  She is trying to lose weight and is doing so. She is exercising and trying to improve her diet and be compliant with a appropriate caloric restricted diabetic diet.  Wt Readings from Last 3 Encounters:  11/18/11 187 lb (84.823 kg)  09/10/11 192 lb (87.091 kg)  08/29/11 192 lb 1 oz (87.119 kg)   She does not use alcohol have her.   Medications, allergies, past medical history, past surgical history, family history and social history are reviewed and updated in the EMR.   Review of Systems As above - note that she is not using gabapentin at this time only taking it as needed as another as needed medication but she has not started regarding her headaches.    Objective:   Physical Exam Obese well-developed white woman in no acute distress Eyes anicteric The abdomen is soft with some mild epigastric tenderness to deep palpation is obese, bowel sounds are present       Assessment & Plan:   1. Fatty liver disease, nonalcoholic    This appears to be her problem based upon CT imaging from 2012, serologic testing that is negative.BMI is 34. Thus she does not seem to meet criteria for bariatric surgery. She is close, if her BMI were 35 she would be. I think that's an option, at least for the future. We discussed is a little bit today.  I'm going to have her repeat LFTs. If they are persistently and arrange really been or are higher, a liver biopsy will be recommended to understand the cytology better. This could  guide future therapy. If they are significantly improved, will continue to monitor I think.  She is congratulated on the weight loss and encouraged to continue with her efforts.  I have given her a handout about liver biopsy as that is a possibility. Risks benefits and indications have been reviewed.  CC: Odette Fraction, MD (PCP) and ERIC Rollene Rotunda, MD (Neurology)

## 2011-11-18 NOTE — Patient Instructions (Signed)
Your physician has requested that you go to the basement for the following lab work before leaving today: Liver function test  We will call you with plans after we get the lab results.

## 2011-11-19 ENCOUNTER — Other Ambulatory Visit: Payer: Self-pay

## 2011-11-19 DIAGNOSIS — K760 Fatty (change of) liver, not elsewhere classified: Secondary | ICD-10-CM

## 2011-11-19 NOTE — Progress Notes (Signed)
Quick Note:  LFT's are much better No biopsy now  Let her know  Repeat the LFT's in 3 months please re: abnormal transaminases 790.4 and fatty liver  Please cc her PCP ______

## 2011-12-19 ENCOUNTER — Other Ambulatory Visit: Payer: Self-pay | Admitting: Family Medicine

## 2011-12-19 DIAGNOSIS — Z1231 Encounter for screening mammogram for malignant neoplasm of breast: Secondary | ICD-10-CM

## 2012-01-06 ENCOUNTER — Ambulatory Visit
Admission: RE | Admit: 2012-01-06 | Discharge: 2012-01-06 | Disposition: A | Discharge status: Home / Self Care | Payer: Medicare Other | Source: Ambulatory Visit | Attending: Family Medicine | Admitting: Family Medicine

## 2012-01-06 DIAGNOSIS — Z1231 Encounter for screening mammogram for malignant neoplasm of breast: Secondary | ICD-10-CM

## 2012-02-18 ENCOUNTER — Telehealth: Payer: Self-pay | Admitting: Internal Medicine

## 2012-02-18 NOTE — Addendum Note (Signed)
Addended by: Marlon Pel on: 02/18/2012 11:20 AM   Modules accepted: Orders

## 2012-02-18 NOTE — Telephone Encounter (Signed)
Pt is requesting order to be sent to Attention: [facility]

## 2012-02-18 NOTE — Telephone Encounter (Signed)
Order for LFT sent

## 2012-02-25 ENCOUNTER — Telehealth: Payer: Self-pay

## 2012-02-25 NOTE — Telephone Encounter (Signed)
We received labs done at [facility] today.  Per Dr. Carlean Purl patient was informed that LFT's mildly abnormal, to get them checked every 6 months at Dr. Cletus Gash Pickard's office (PCP) for stablity.  Patient verbalized understanding.  I am going to fax Dr. Celesta Aver request to Dr. Dennard Schaumann for their information.

## 2012-08-05 ENCOUNTER — Encounter: Payer: Self-pay | Admitting: Family Medicine

## 2012-08-05 ENCOUNTER — Ambulatory Visit (INDEPENDENT_AMBULATORY_CARE_PROVIDER_SITE_OTHER): Payer: Medicare Other | Admitting: Family Medicine

## 2012-08-05 ENCOUNTER — Telehealth: Payer: Self-pay | Admitting: Family Medicine

## 2012-08-05 VITALS — BP 128/80 | HR 98 | Temp 97.6°F | Resp 16 | Wt 190.0 lb

## 2012-08-05 DIAGNOSIS — K219 Gastro-esophageal reflux disease without esophagitis: Secondary | ICD-10-CM

## 2012-08-05 DIAGNOSIS — E785 Hyperlipidemia, unspecified: Secondary | ICD-10-CM | POA: Insufficient documentation

## 2012-08-05 DIAGNOSIS — I10 Essential (primary) hypertension: Secondary | ICD-10-CM

## 2012-08-05 DIAGNOSIS — IMO0001 Reserved for inherently not codable concepts without codable children: Secondary | ICD-10-CM

## 2012-08-05 MED ORDER — PANTOPRAZOLE SODIUM 40 MG PO TBEC: 40.0000 mg | DELAYED_RELEASE_TABLET | Freq: Every day | ORAL | Status: DC

## 2012-08-05 MED ORDER — SITAGLIPTIN PHOSPHATE 50 MG PO TABS: 50.0000 mg | ORAL_TABLET | Freq: Every day | ORAL | Status: DC

## 2012-08-05 NOTE — Progress Notes (Signed)
Subjective:     Patient ID: Jacqueline [name], female   DOB: [birth year], 47 y.o.   MRN: [id number]  HPI Patient is here for followup of her diabetes, hyperlipidemia, hypertension, fatty liver disease.  She also reports increased reflux and indigestion. 1 diabetes mellitus type 2-  she suffers with peripheral neuropathy.  She is on metformin 500 mg by mouth twice a day.  Fasting blood sugars range 90-120.  However her hemoglobin A1c is elevated at 7.0.  She denies blurred vision polyuria or polydipsia.  She denies hypoglycemic episodes. 2 hypertension she is currently on Toprol-XL 50 mg by mouth daily.  She denies chest pain or shortness of breath. #3 hyperlipidemia she is presently on Lipitor 40 mg by mouth daily. Fasting panel shows total cholesterol 226 triglycerides 199 HDL 46 and LDL of 140 recent liver function tests reveal an AST of 60 and an ALT of 100.  She denies myalgias or right upper quadrant pain.  Her fatty liver disease is stable. For her she reports indigestion and epigastric abdominal pain worse after meals.  Review of Systems  Constitutional: Negative.   HENT: Negative.   Eyes: Negative.   Respiratory: Negative.   Cardiovascular: Negative.   Gastrointestinal: Positive for abdominal pain. Negative for diarrhea and constipation.  Genitourinary: Negative.   Neurological: Positive for numbness.  Psychiatric/Behavioral: Negative.        Objective:   Physical Exam  Constitutional: She appears well-developed and well-nourished.  HENT:  Head: Normocephalic and atraumatic.  Eyes: Conjunctivae and EOM are normal. Pupils are equal, round, and reactive to light.  Neck: Normal range of motion. Neck supple. No JVD present. No thyromegaly present.  Cardiovascular: Normal rate, regular rhythm, normal heart sounds and intact distal pulses.   No murmur heard. Pulmonary/Chest: Effort normal and breath sounds normal. No respiratory distress.  Abdominal: Soft. Bowel sounds are normal. She  exhibits no distension. There is no tenderness.  Skin: Skin is warm and dry.   sensation is diminished to 10 mg on filament bilaterally     Assessment:    Type II or unspecified type diabetes mellitus without mention of complication, uncontrolled - Plan: sitaGLIPtin (JANUVIA) 50 MG tablet  GERD (gastroesophageal reflux disease) - Plan: pantoprazole (PROTONIX) 40 MG tablet  Essential hypertension, benign  Other and unspecified hyperlipidemia      Plan:     1) add Januvia 50 mg by mouth daily recheck hemoglobin A1c in 3 months. Encouraged annual eye exam. #2 hypertension is well controlled no touch her she medications. #3 hyperlipidemia-discussed at length adding zetia at 10 mg by mouth daily to her Lipitor. However she is very concerned due to her history of fatty liver disease and elevated liver function tests. She is also taking multiple liver toxic medications including the Lipitor as well as the sonogram. She defers adding Zetia he at this time. She will try to avoid all animal meat and animal byproducts.  Recheck a fasting lipid panel and liver function tests in 3 months. #4 GERD-add protonix 40 mg by mouth daily

## 2012-08-06 NOTE — Telephone Encounter (Signed)
Called to pharmacy 

## 2012-08-18 ENCOUNTER — Ambulatory Visit: Payer: Medicare Other | Attending: Unknown Physician Specialty | Admitting: Physical Therapy

## 2012-08-18 DIAGNOSIS — R293 Abnormal posture: Secondary | ICD-10-CM | POA: Insufficient documentation

## 2012-08-18 DIAGNOSIS — IMO0001 Reserved for inherently not codable concepts without codable children: Secondary | ICD-10-CM | POA: Insufficient documentation

## 2012-08-18 DIAGNOSIS — M542 Cervicalgia: Secondary | ICD-10-CM | POA: Insufficient documentation

## 2012-08-25 ENCOUNTER — Ambulatory Visit: Payer: Medicare Other | Admitting: Physical Therapy

## 2012-09-01 ENCOUNTER — Ambulatory Visit: Payer: Medicare Other | Admitting: Physical Therapy

## 2012-09-03 ENCOUNTER — Encounter: Payer: Medicare Other | Admitting: Physical Therapy

## 2012-09-15 ENCOUNTER — Telehealth: Payer: Self-pay | Admitting: Family Medicine

## 2012-09-15 MED ORDER — EPINEPHRINE 0.3 MG/0.3ML IJ SOAJ: 0.3000 mg | Freq: Once | INTRAMUSCULAR | Status: DC

## 2012-09-15 NOTE — Telephone Encounter (Signed)
Rx Refilled  

## 2012-10-06 ENCOUNTER — Ambulatory Visit (INDEPENDENT_AMBULATORY_CARE_PROVIDER_SITE_OTHER): Payer: Medicare Other | Admitting: Family Medicine

## 2012-10-06 ENCOUNTER — Encounter: Payer: Self-pay | Admitting: Family Medicine

## 2012-10-06 ENCOUNTER — Telehealth: Payer: Self-pay | Admitting: Family Medicine

## 2012-10-06 VITALS — BP 126/78 | HR 68 | Temp 98.2°F | Resp 18 | Wt 186.0 lb

## 2012-10-06 DIAGNOSIS — L259 Unspecified contact dermatitis, unspecified cause: Secondary | ICD-10-CM

## 2012-10-06 MED ORDER — PREDNISONE 20 MG PO TABS: ORAL_TABLET | ORAL | Status: DC

## 2012-10-06 NOTE — Progress Notes (Signed)
Subjective:    Patient ID: Jacqueline [name], female    DOB: [birth year], 47 y.o.   MRN: [id number]  HPI Patient reports a itchy rash on both forearms both hands and around her eyes since this weekend. The rash consists of red papules and vesicles. They're clustered and in linear groups. She developed a rash after moving limbs from a tree her husband cut down this weekend.  She has tried calamine and hydrocortisone creams without benefit. Past Medical History  Diagnosis Date  . Fatty liver   . IBS (irritable bowel syndrome)   . GERD (gastroesophageal reflux disease)   . Anemia   . Anxiety disorder   . Arthritis   . Asthma   . Chronic headaches   . Kidney stones   . Pneumonia   . Obesity   . Erosive esophagitis   . Abnormal transaminases   . Sinus tachycardia   . Cellulitis   . DM (diabetes mellitus)   . Diverticulitis    Current Outpatient Prescriptions on File Prior to Visit  Medication Sig Dispense Refill  . albuterol (PROVENTIL HFA;VENTOLIN HFA) 108 (90 BASE) MCG/ACT inhaler Inhale 2 puffs into the lungs every 6 (six) hours as needed.      Marland Kitchen atorvastatin (LIPITOR) 40 MG tablet Take 40 mg by mouth at bedtime.      . Blood Glucose Monitoring Suppl (ONE TOUCH ULTRA SYSTEM KIT) W/DEVICE KIT 1 kit by Does not apply route once.      Marland Kitchen EPINEPHrine (EPIPEN 2-PAK) 0.3 mg/0.3 mL DEVI Inject 0.3 mLs (0.3 mg total) into the muscle once.  6 Device  1  . gabapentin (NEURONTIN) 600 MG tablet Take 600 mg by mouth 3 (three) times daily as needed.      . metFORMIN (GLUCOPHAGE) 500 MG tablet Take 500 mg by mouth 2 (two) times daily with a meal.      . metoprolol succinate (TOPROL-XL) 50 MG 24 hr tablet Take 50 mg by mouth daily. Take with or immediately following a meal.      . pantoprazole (PROTONIX) 40 MG tablet Take 1 tablet (40 mg total) by mouth daily.  30 tablet  11  . sitaGLIPtin (JANUVIA) 50 MG tablet Take 1 tablet (50 mg total) by mouth daily.  30 tablet  5  . SUMAtriptan Succinate (IMITREX  PO) Take by mouth as needed.      . zonisamide (ZONEGRAN) 100 MG capsule Take 100 mg by mouth daily.       No current facility-administered medications on file prior to visit.   Allergies  Allergen Reactions  . Naproxen Sodium   . Reglan (Metoclopramide)       Review of Systems  All other systems reviewed and are negative.       Objective:   Physical Exam  Vitals reviewed. Constitutional: She appears well-developed and well-nourished.  Cardiovascular: Normal rate, regular rhythm and normal heart sounds.  Exam reveals no gallop.   No murmur heard. Pulmonary/Chest: Effort normal and breath sounds normal. No respiratory distress. She has no wheezes. She has no rales.  Skin: Rash noted. There is erythema.   rash as described in the history above. There are linear clusters of vesicles and papules on the forearms hands and face.        Assessment & Plan:  1. Contact dermatitis Discussed the risk of prednisone given her diabetes. The patient elects to try the prednisone due to the discomfort the rash is causing. We discussed what to do in case  of elevated blood sugars. She is going to call me if the sugars are consistently higher than 300 for as needed regular insulin. - predniSONE (DELTASONE) 20 MG tablet; 3 tabs poqday 1-2, 2 tabs poqday 3-4, 1 tab poqday 5-6  Dispense: 12 tablet; Refill: 0

## 2012-10-07 ENCOUNTER — Telehealth: Payer: Self-pay | Admitting: Family Medicine

## 2012-10-07 MED ORDER — ONETOUCH LANCETS MISC: Status: DC

## 2012-10-07 NOTE — Telephone Encounter (Signed)
Rx Refilled  

## 2012-10-08 MED ORDER — GLUCOSE BLOOD VI STRP: ORAL_STRIP | Status: DC

## 2012-10-08 NOTE — Telephone Encounter (Signed)
Med rf

## 2012-11-03 ENCOUNTER — Other Ambulatory Visit: Payer: Self-pay | Admitting: Family Medicine

## 2012-11-03 DIAGNOSIS — E785 Hyperlipidemia, unspecified: Secondary | ICD-10-CM

## 2012-11-03 DIAGNOSIS — Z79899 Other long term (current) drug therapy: Secondary | ICD-10-CM

## 2012-11-03 DIAGNOSIS — E119 Type 2 diabetes mellitus without complications: Secondary | ICD-10-CM

## 2012-11-04 ENCOUNTER — Other Ambulatory Visit (INDEPENDENT_AMBULATORY_CARE_PROVIDER_SITE_OTHER): Payer: Self-pay

## 2012-11-04 DIAGNOSIS — Z79899 Other long term (current) drug therapy: Secondary | ICD-10-CM

## 2012-11-04 DIAGNOSIS — E119 Type 2 diabetes mellitus without complications: Secondary | ICD-10-CM

## 2012-11-04 DIAGNOSIS — E785 Hyperlipidemia, unspecified: Secondary | ICD-10-CM

## 2012-11-04 LAB — COMPREHENSIVE METABOLIC PANEL
ALT: 70 U/L — ABNORMAL HIGH (ref 0–35)
AST: 36 U/L (ref 0–37)
Albumin: 4.4 g/dL (ref 3.5–5.2)
Alkaline Phosphatase: 71 U/L (ref 39–117)
BUN: 14 mg/dL (ref 6–23)
CO2: 27 mEq/L (ref 19–32)
Calcium: 10.3 mg/dL (ref 8.4–10.5)
Chloride: 103 mEq/L (ref 96–112)
Creat: 0.79 mg/dL (ref 0.50–1.10)
Glucose, Bld: 112 mg/dL — ABNORMAL HIGH (ref 70–99)
Potassium: 5.6 mEq/L — ABNORMAL HIGH (ref 3.5–5.3)
Sodium: 143 mEq/L (ref 135–145)
Total Bilirubin: 0.4 mg/dL (ref 0.3–1.2)
Total Protein: 7.4 g/dL (ref 6.0–8.3)

## 2012-11-04 LAB — CBC WITH DIFFERENTIAL/PLATELET
Basophils Absolute: 0 10*3/uL (ref 0.0–0.1)
Basophils Relative: 0 % (ref 0–1)
Eosinophils Absolute: 0.2 10*3/uL (ref 0.0–0.7)
Eosinophils Relative: 2 % (ref 0–5)
HCT: 40.4 % (ref 36.0–46.0)
Hemoglobin: 13.5 g/dL (ref 12.0–15.0)
Lymphocytes Relative: 37 % (ref 12–46)
Lymphs Abs: 3.4 10*3/uL (ref 0.7–4.0)
MCH: 29.5 pg (ref 26.0–34.0)
MCHC: 33.4 g/dL (ref 30.0–36.0)
MCV: 88.2 fL (ref 78.0–100.0)
Monocytes Absolute: 0.4 10*3/uL (ref 0.1–1.0)
Monocytes Relative: 5 % (ref 3–12)
Neutro Abs: 5.1 10*3/uL (ref 1.7–7.7)
Neutrophils Relative %: 56 % (ref 43–77)
Platelets: 364 10*3/uL (ref 150–400)
RBC: 4.58 MIL/uL (ref 3.87–5.11)
RDW: 13.3 % (ref 11.5–15.5)
WBC: 9.2 10*3/uL (ref 4.0–10.5)

## 2012-11-04 LAB — LIPID PANEL
Cholesterol: 189 mg/dL (ref 0–200)
HDL: 45 mg/dL (ref >39)
LDL Cholesterol: 113 mg/dL — ABNORMAL HIGH (ref 0–99)
Total CHOL/HDL Ratio: 4.2 Ratio
Triglycerides: 154 mg/dL — ABNORMAL HIGH (ref <150)
VLDL: 31 mg/dL (ref 0–40)

## 2012-11-05 ENCOUNTER — Encounter: Payer: Self-pay | Admitting: Family Medicine

## 2012-11-05 ENCOUNTER — Ambulatory Visit (INDEPENDENT_AMBULATORY_CARE_PROVIDER_SITE_OTHER): Payer: Medicare Other | Admitting: Family Medicine

## 2012-11-05 VITALS — BP 132/78 | HR 78 | Temp 98.3°F | Resp 18 | Wt 188.0 lb

## 2012-11-05 DIAGNOSIS — E119 Type 2 diabetes mellitus without complications: Secondary | ICD-10-CM

## 2012-11-05 DIAGNOSIS — I10 Essential (primary) hypertension: Secondary | ICD-10-CM

## 2012-11-05 DIAGNOSIS — E785 Hyperlipidemia, unspecified: Secondary | ICD-10-CM

## 2012-11-05 DIAGNOSIS — R079 Chest pain, unspecified: Secondary | ICD-10-CM

## 2012-11-05 NOTE — Progress Notes (Signed)
Subjective:    Patient ID: Jacqueline [name], female    DOB: 1965-12-05, 47 y.o.   MRN: [id number]  HPI Patient presents today for her regularly scheduled appointment for diabetes mellitus type 2, hypertension, hyperlipidemia and fatty liver disease. Her most recent labs are listed below. However she is now also complaining of left-sided chest pain. The pain has been occuring for the last 3 weeks. At times it is sharp and intense. She denies any relationship to exertion. She denies any shortness of breath. She denies a relationship to food. She denies any relationship to stress or anxiety. The pain only last a minute or so. It is very atypical for heart problems.  However her family history is significant for a father with heart attack at 49.  Did review with the patient her labs. Her LDL has dropped from 140-114. Her liver function tests improved as well with an AST of 60 and an ALT of 100 previously. Most recent AST and ALT are 36 and 70.  She has really tried to change her lifestyle. She is also taking atorvastatin 40 mg by mouth daily. Her hemoglobin A1c has dropped from 7-6.2 which is excellent. Appointment on 11/04/2012  Component Date Value Range Status  . WBC 11/04/2012 9.2  4.0 - 10.5 K/uL Final  . RBC 11/04/2012 4.58  3.87 - 5.11 MIL/uL Final  . Hemoglobin 11/04/2012 13.5  12.0 - 15.0 g/dL Final  . HCT 11/04/2012 40.4  36.0 - 46.0 % Final  . MCV 11/04/2012 88.2  78.0 - 100.0 fL Final  . MCH 11/04/2012 29.5  26.0 - 34.0 pg Final  . MCHC 11/04/2012 33.4  30.0 - 36.0 g/dL Final  . RDW 11/04/2012 13.3  11.5 - 15.5 % Final  . Platelets 11/04/2012 364  150 - 400 K/uL Final  . Neutrophils Relative % 11/04/2012 56  43 - 77 % Final  . Neutro Abs 11/04/2012 5.1  1.7 - 7.7 K/uL Final  . Lymphocytes Relative 11/04/2012 37  12 - 46 % Final  . Lymphs Abs 11/04/2012 3.4  0.7 - 4.0 K/uL Final  . Monocytes Relative 11/04/2012 5  3 - 12 % Final  . Monocytes Absolute 11/04/2012 0.4  0.1 - 1.0 K/uL Final   . Eosinophils Relative 11/04/2012 2  0 - 5 % Final  . Eosinophils Absolute 11/04/2012 0.2  0.0 - 0.7 K/uL Final  . Basophils Relative 11/04/2012 0  0 - 1 % Final  . Basophils Absolute 11/04/2012 0.0  0.0 - 0.1 K/uL Final  . Smear Review 11/04/2012 Criteria for review not met   Final  . Sodium 11/04/2012 143  135 - 145 mEq/L Final  . Potassium 11/04/2012 5.6* 3.5 - 5.3 mEq/L Final   No visible hemolysis.  . Chloride 11/04/2012 103  96 - 112 mEq/L Final  . CO2 11/04/2012 27  19 - 32 mEq/L Final  . Glucose, Bld 11/04/2012 112* 70 - 99 mg/dL Final  . BUN 11/04/2012 14  6 - 23 mg/dL Final  . Creat 11/04/2012 0.79  0.50 - 1.10 mg/dL Final  . Total Bilirubin 11/04/2012 0.4  0.3 - 1.2 mg/dL Final  . Alkaline Phosphatase 11/04/2012 71  39 - 117 U/L Final  . AST 11/04/2012 36  0 - 37 U/L Final  . ALT 11/04/2012 70* 0 - 35 U/L Final  . Total Protein 11/04/2012 7.4  6.0 - 8.3 g/dL Final  . Albumin 11/04/2012 4.4  3.5 - 5.2 g/dL Final  . Calcium 11/04/2012 10.3  8.4 - 10.5 mg/dL Final  . Hemoglobin A1C 11/04/2012 6.2* <5.7 % Final   Comment:                                                                                                 According to the ADA Clinical Practice Recommendations for 2011, when                          HbA1c is used as a screening test:                                                       >=6.5%   Diagnostic of Diabetes Mellitus                                     (if abnormal result is confirmed)                                                     5.7-6.4%   Increased risk of developing Diabetes Mellitus                                                     References:Diagnosis and Classification of Diabetes Mellitus,Diabetes                          HYQM,5784,69(GEXBM 1):S62-S69 and Standards of Medical Care in                                  Diabetes - 2011,Diabetes WUXL,2440,10 (Suppl 1):S11-S61.                             . Mean Plasma Glucose 11/04/2012 131*  <117 mg/dL Final  . Cholesterol 11/04/2012 189  0 - 200 mg/dL Final   Comment: ATP III Classification:                                < 200        mg/dL        Desirable                               200 - 239     mg/dL        Borderline High                               >=  240        mg/dL        High                             . Triglycerides 11/04/2012 154* <150 mg/dL Final  . HDL 11/04/2012 45  >39 mg/dL Final  . Total CHOL/HDL Ratio 11/04/2012 4.2   Final  . VLDL 11/04/2012 31  0 - 40 mg/dL Final  . LDL Cholesterol 11/04/2012 113* 0 - 99 mg/dL Final   Comment:                            Total Cholesterol/HDL Ratio:CHD Risk                                                 Coronary Heart Disease Risk Table                                                                 Men       Women                                   1/2 Average Risk              3.4        3.3                                       Average Risk              5.0        4.4                                    2X Average Risk              9.6        7.1                                    3X Average Risk             23.4       11.0                          Use the calculated Patient Ratio above and the CHD Risk table                           to determine the patient's CHD Risk.                          ATP III Classification (LDL):                                <  100        mg/dL         Optimal                               100 - 129     mg/dL         Near or Above Optimal                               130 - 159     mg/dL         Borderline High                               160 - 189     mg/dL         High                                > 190        mg/dL         Very High                              Past Medical History  Diagnosis Date  . Fatty liver   . IBS (irritable bowel syndrome)   . GERD (gastroesophageal reflux disease)   . Anemia   . Anxiety disorder   . Arthritis   . Asthma   . Chronic headaches    . Kidney stones   . Pneumonia   . Obesity   . Erosive esophagitis   . Abnormal transaminases   . Sinus tachycardia   . Cellulitis   . DM (diabetes mellitus)   . Diverticulitis    Past Surgical History  Procedure Laterality Date  . Nasal septum surgery    . Ankle surgery    . Foot surgery    . Ovarian cysts      x3  . Tonsillectomy    . Tubal ligation    . Egd  06/09/2009  . Colonoscopy  06/19/2009  . Vaginal hysterectomy     Current Outpatient Prescriptions on File Prior to Visit  Medication Sig Dispense Refill  . albuterol (PROVENTIL HFA;VENTOLIN HFA) 108 (90 BASE) MCG/ACT inhaler Inhale 2 puffs into the lungs every 6 (six) hours as needed.      Marland Kitchen atorvastatin (LIPITOR) 40 MG tablet Take 40 mg by mouth at bedtime.      . Blood Glucose Monitoring Suppl (ONE TOUCH ULTRA SYSTEM KIT) W/DEVICE KIT 1 kit by Does not apply route once.      Marland Kitchen EPINEPHrine (EPIPEN 2-PAK) 0.3 mg/0.3 mL DEVI Inject 0.3 mLs (0.3 mg total) into the muscle once.  6 Device  1  . gabapentin (NEURONTIN) 600 MG tablet Take 600 mg by mouth 3 (three) times daily as needed.      Marland Kitchen glucose blood test strip Use as instructed  100 each  2  . metFORMIN (GLUCOPHAGE) 500 MG tablet Take 500 mg by mouth 2 (two) times daily with a meal.      . metoprolol succinate (TOPROL-XL) 50 MG 24 hr tablet Take 50 mg by mouth daily. Take with or immediately following a meal.      . ONE TOUCH LANCETS MISC One  touch delica 29J lancets  242 each  3  . pantoprazole (PROTONIX) 40 MG tablet Take 1 tablet (40 mg total) by mouth daily.  30 tablet  11  . sitaGLIPtin (JANUVIA) 50 MG tablet Take 1 tablet (50 mg total) by mouth daily.  30 tablet  5  . SUMAtriptan Succinate (IMITREX PO) Take by mouth as needed.       No current facility-administered medications on file prior to visit.   Allergies  Allergen Reactions  . Naproxen Sodium   . Reglan (Metoclopramide)    History   Social History  . Marital Status: Married    Spouse Name: N/A     Number of Children: 3  . Years of Education: N/A   Occupational History  . Disabled    Social History Main Topics  . Smoking status: Former Research scientist (life sciences)  . Smokeless tobacco: Not on file  . Alcohol Use: No  . Drug Use: No  . Sexually Active: Not on file   Other Topics Concern  . Not on file   Social History Narrative  . No narrative on file     Review of Systems  All other systems reviewed and are negative.       Objective:   Physical Exam  Vitals reviewed. Constitutional: She is oriented to person, place, and time. She appears well-developed and well-nourished.  Cardiovascular: Normal rate, regular rhythm and normal heart sounds.  Exam reveals no gallop.   No murmur heard. Pulmonary/Chest: Effort normal and breath sounds normal. No respiratory distress. She has no wheezes. She has no rales. She exhibits no tenderness.  Abdominal: Soft. Bowel sounds are normal. She exhibits no distension. There is no tenderness. There is no rebound and no guarding.  Neurological: She is alert and oriented to person, place, and time.  Skin: Skin is warm. No rash noted. No erythema. No pallor.    EKG shows normal sinus rhythm with a normal intervals and normal axis. There is no evidence of ischemia or infarction      Assessment & Plan:  1. Type II or unspecified type diabetes mellitus without mention of complication, not stated as uncontrolled Diabetes test is excellent. Continue current medications at the present dosages. Diabetic foot exam was performed today and is significant for metatarsalgia in the left first MTP joint. We talked about orthotics for this. Defer this until we have her chest pain further clarified.  Also recommended she start an aspirin 81 mg by mouth daily  2. Other and unspecified hyperlipidemia Her LDL is not at goal at 113, I am hesitant to add more medication given her history of significantly elevated liver function tests in the past. Therefore I recommended she  continue Lipitor 40 mg by mouth daily and try to reduce consumption of animal fats/animal meat.  3. Essential hypertension, benign Blood pressure is well controlled. I recommended she continue the current medications at the present dosages.  4. Chest pain, unspecified There is a very low likelihood of this being cardiac. I think is more likely to be either gastrointestinal, anxiety, possibly muscular. However given her family history, her diabetes, her hypertension, and hyperlipidemia, I would recommend getting an outpatient stress test to be thorough.  She also has a history of smoking, if the pain persists, I would recommend chest x-ray. - EKG 12-Lead - Ambulatory referral to Cardiology

## 2012-11-25 ENCOUNTER — Telehealth: Payer: Self-pay | Admitting: Family Medicine

## 2012-11-25 NOTE — Telephone Encounter (Signed)
Called pt on 7/11 and on 7/16 no call back to schedule as of yet.

## 2012-12-02 ENCOUNTER — Ambulatory Visit (INDEPENDENT_AMBULATORY_CARE_PROVIDER_SITE_OTHER): Payer: Medicare Other | Admitting: Family Medicine

## 2012-12-02 ENCOUNTER — Ambulatory Visit: Payer: Medicare Other | Admitting: Cardiology

## 2012-12-02 ENCOUNTER — Ambulatory Visit: Payer: Medicare Other

## 2012-12-02 VITALS — BP 130/80 | HR 112 | Temp 98.0°F | Resp 16 | Ht 62.0 in | Wt 190.0 lb

## 2012-12-02 DIAGNOSIS — M25572 Pain in left ankle and joints of left foot: Secondary | ICD-10-CM

## 2012-12-02 DIAGNOSIS — S99921A Unspecified injury of right foot, initial encounter: Secondary | ICD-10-CM

## 2012-12-02 DIAGNOSIS — M25579 Pain in unspecified ankle and joints of unspecified foot: Secondary | ICD-10-CM

## 2012-12-02 DIAGNOSIS — S99929A Unspecified injury of unspecified foot, initial encounter: Secondary | ICD-10-CM

## 2012-12-02 DIAGNOSIS — T148XXA Other injury of unspecified body region, initial encounter: Secondary | ICD-10-CM

## 2012-12-02 DIAGNOSIS — S8990XA Unspecified injury of unspecified lower leg, initial encounter: Secondary | ICD-10-CM

## 2012-12-02 MED ORDER — OXYCODONE HCL 5 MG PO CAPS: 5.0000 mg | ORAL_CAPSULE | Freq: Three times a day (TID) | ORAL | Status: DC | PRN

## 2012-12-02 MED ORDER — OXYCODONE HCL 5 MG PO TABS: 5.0000 mg | ORAL_TABLET | Freq: Three times a day (TID) | ORAL | Status: DC | PRN

## 2012-12-02 NOTE — Progress Notes (Signed)
[facility]:  Office Visit  Chief Complaint:  Chief Complaint  Patient presents with  . Foot Pain    injury, this morning    HPI: Jacqueline [name] is a 47 y.o. female who complains of constant throbbing  left foot pain x 1 day, started  this morning. She was trying to take her shower when she sprained her ankle, she heard a crunch when her foot went backwards. She has had sharp pain, + bruising and swelling. She has had surgeries on that foot and ankle before, a failed foot surgery and also has had a peroneal rupture and heel repair. She has DM x 1 year much bette controlled. HbA1c per patient is around 6. She normally has burning on the tips of her toes but now she has numbness and tingling on her left 4,5th toes. She has not put any weight on it, has used a walker.   2003 1st surgery 2005 2nd surgery  Past Medical History  Diagnosis Date  . Fatty liver   . IBS (irritable bowel syndrome)   . GERD (gastroesophageal reflux disease)   . Anemia   . Anxiety disorder   . Arthritis   . Asthma   . Chronic headaches   . Kidney stones   . Pneumonia   . Obesity   . Erosive esophagitis   . Abnormal transaminases   . Sinus tachycardia   . Cellulitis   . DM (diabetes mellitus)   . Diverticulitis    Past Surgical History  Procedure Laterality Date  . Nasal septum surgery    . Ankle surgery    . Foot surgery    . Ovarian cysts      x3  . Tonsillectomy    . Tubal ligation    . Egd  06/09/2009  . Colonoscopy  06/19/2009  . Vaginal hysterectomy     History   Social History  . Marital Status: Married    Spouse Name: N/A    Number of Children: 3  . Years of Education: N/A   Occupational History  . Disabled    Social History Main Topics  . Smoking status: Former Research scientist (life sciences)  . Smokeless tobacco: None  . Alcohol Use: No  . Drug Use: No  . Sexually Active: None   Other Topics Concern  . None   Social History Narrative  . None   Family History  Problem  Relation Age of Onset  . Colitis    . Crohn's disease    . Breast cancer    . Ovarian cancer    . Celiac disease    . Clotting disorder    . Cystic fibrosis      pat. cousin  . Diabetes Mother   . Heart disease    . Irritable bowel syndrome    . Kidney failure  31    mat. nephew  . Colon cancer Neg Hx   . Diabetes Maternal Aunt   . Diabetes Son   . Diabetes      mat. nephew   Allergies  Allergen Reactions  . Naproxen Sodium     All pain meds, has fatty liver  . Reglan (Metoclopramide)    Prior to Admission medications   Medication Sig Start Date End Date Taking? Authorizing Provider  albuterol (PROVENTIL HFA;VENTOLIN HFA) 108 (90 BASE) MCG/ACT inhaler Inhale 2 puffs into the lungs every 6 (six) hours as needed.   Yes Historical Provider, MD  atorvastatin (LIPITOR) 40 MG tablet Take  40 mg by mouth at bedtime.   Yes Historical Provider, MD  Blood Glucose Monitoring Suppl (ONE TOUCH ULTRA SYSTEM KIT) W/DEVICE KIT 1 kit by Does not apply route once.   Yes Historical Provider, MD  EPINEPHrine (EPIPEN 2-PAK) 0.3 mg/0.3 mL DEVI Inject 0.3 mLs (0.3 mg total) into the muscle once. 09/15/12  Yes Susy Frizzle, MD  gabapentin (NEURONTIN) 600 MG tablet Take 600 mg by mouth 3 (three) times daily as needed.   Yes Historical Provider, MD  glucose blood test strip Use as instructed 10/08/12  Yes Susy Frizzle, MD  metFORMIN (GLUCOPHAGE) 500 MG tablet Take 850 mg by mouth 2 (two) times daily with a meal.    Yes Historical Provider, MD  metoprolol succinate (TOPROL-XL) 50 MG 24 hr tablet Take 50 mg by mouth daily. Take with or immediately following a meal.   Yes Historical Provider, MD  ONE TOUCH LANCETS MISC One touch delica 63O lancets 7/56/43  Yes Susy Frizzle, MD  pantoprazole (PROTONIX) 40 MG tablet Take 1 tablet (40 mg total) by mouth daily. 08/05/12  Yes Susy Frizzle, MD  sitaGLIPtin (JANUVIA) 50 MG tablet Take 1 tablet (50 mg total) by mouth daily. 08/05/12  Yes Susy Frizzle,  MD  SUMAtriptan Succinate (IMITREX PO) Take by mouth as needed.   Yes Historical Provider, MD     ROS: The patient denies fevers, chills, night sweats, unintentional weight loss, chest pain, palpitations, wheezing, dyspnea on exertion, nausea, vomiting, abdominal pain, dysuria, hematuria, melena, + numbness, weakness, or tingling.   All other systems have been reviewed and were otherwise negative with the exception of those mentioned in the HPI and as above.    PHYSICAL EXAM: Filed Vitals:   12/02/12 0949  BP: 130/80  Pulse: 112  Temp: 98 F (36.7 C)  Resp: 16   Filed Vitals:   12/02/12 0949  Height: 5' 2"  (1.575 m)  Weight: 190 lb (86.183 kg)   Body mass index is 34.74 kg/(m^2).  General: Alert, no acute distress HEENT:  Normocephalic, atraumatic, oropharynx patent.  Cardiovascular:  Regular rate and rhythm, no rubs murmurs or gallops.  No Carotid bruits, radial pulse intact.  Respiratory: Clear to auscultation bilaterally.  No wheezes, rales, or rhonchi.  No cyanosis, no use of accessory musculature GI: No organomegaly, abdomen is soft and non-tender, positive bowel sounds.  No masses. Skin: No rashes. Neurologic: Facial musculature symmetric. Psychiatric: Patient is appropriate throughout our interaction. Lymphatic: No cervical lymphadenopathy Musculoskeletal: Gait intact. Left tib/fib-no pain Left ankle-swelling, bruising on anterior aspect, + slightly cool, + DP with Doppler LEft foot-tender on top of foot, there is significant swelling and tenderness at the 5th MTP upon palpation 5/5 dorsi and plantarflexion, she has limited ROM in dorsiflexion   LABS: Results for orders placed in visit on 11/04/12  CBC WITH DIFFERENTIAL      Result Value Range   WBC 9.2  4.0 - 10.5 K/uL   RBC 4.58  3.87 - 5.11 MIL/uL   Hemoglobin 13.5  12.0 - 15.0 g/dL   HCT 40.4  36.0 - 46.0 %   MCV 88.2  78.0 - 100.0 fL   MCH 29.5  26.0 - 34.0 pg   MCHC 33.4  30.0 - 36.0 g/dL   RDW 13.3   11.5 - 15.5 %   Platelets 364  150 - 400 K/uL   Neutrophils Relative % 56  43 - 77 %   Neutro Abs 5.1  1.7 - 7.7 K/uL  Lymphocytes Relative 37  12 - 46 %   Lymphs Abs 3.4  0.7 - 4.0 K/uL   Monocytes Relative 5  3 - 12 %   Monocytes Absolute 0.4  0.1 - 1.0 K/uL   Eosinophils Relative 2  0 - 5 %   Eosinophils Absolute 0.2  0.0 - 0.7 K/uL   Basophils Relative 0  0 - 1 %   Basophils Absolute 0.0  0.0 - 0.1 K/uL   Smear Review Criteria for review not met    COMPREHENSIVE METABOLIC PANEL      Result Value Range   Sodium 143  135 - 145 mEq/L   Potassium 5.6 (*) 3.5 - 5.3 mEq/L   Chloride 103  96 - 112 mEq/L   CO2 27  19 - 32 mEq/L   Glucose, Bld 112 (*) 70 - 99 mg/dL   BUN 14  6 - 23 mg/dL   Creat 0.79  0.50 - 1.10 mg/dL   Total Bilirubin 0.4  0.3 - 1.2 mg/dL   Alkaline Phosphatase 71  39 - 117 U/L   AST 36  0 - 37 U/L   ALT 70 (*) 0 - 35 U/L   Total Protein 7.4  6.0 - 8.3 g/dL   Albumin 4.4  3.5 - 5.2 g/dL   Calcium 10.3  8.4 - 10.5 mg/dL  HEMOGLOBIN A1C      Result Value Range   Hemoglobin A1C 6.2 (*) <5.7 %   Mean Plasma Glucose 131 (*) <117 mg/dL  LIPID PANEL      Result Value Range   Cholesterol 189  0 - 200 mg/dL   Triglycerides 154 (*) <150 mg/dL   HDL 45  >39 mg/dL   Total CHOL/HDL Ratio 4.2     VLDL 31  0 - 40 mg/dL   LDL Cholesterol 113 (*) 0 - 99 mg/dL     EKG/XRAY:   Primary read interpreted by Dr. Marin Comment at [facility]. ? Old cuboid fracture or osteophyte ? Shadow on cuneiform Hardware in place No obvious fracture/dislocation Mortis intact +soft tissue swelling   ASSESSMENT/PLAN: Encounter Diagnoses  Name Primary?  . left foot injury, initial encounter Yes  . Pain in joint, ankle and foot, left   . Sprain and strain    [place] with walker, she cannot use crutches since increase her fall risk Nonweightbearing RICE Oxycodone 5 mg q8hrs prn Advise to f/u sooner for worsening s/sx Gross sideeffects, risk and benefits, and alternatives of medications d/w  patient. Patient is aware that all medications have potential sideeffects and we are unable to predict every sideeffect or drug-drug interaction that may occur. We have referred her to Dr. Doran Durand, a foot and heel specialist at [facility], before he can see her he needs her records from prior othro surgeon since the last surgery was not >10  Yrs ago, this is considered a second opinion referral. Dr. Rosalio Loud was her previous surgeon and he was at [facility], she does not know where he is at now. F/u in 1 week    Kealey Kemmer, [place], DO 12/02/2012 12:20 PM    \

## 2012-12-02 NOTE — Patient Instructions (Signed)

## 2012-12-03 ENCOUNTER — Telehealth: Payer: Self-pay | Admitting: Family Medicine

## 2012-12-03 MED ORDER — HUGO ROLLING WALKER MISC: Status: DC

## 2012-12-03 NOTE — Telephone Encounter (Signed)
Sent to pharm ....

## 2012-12-07 ENCOUNTER — Telehealth: Payer: Self-pay | Admitting: Family Medicine

## 2012-12-10 ENCOUNTER — Ambulatory Visit: Payer: Medicare Other

## 2012-12-10 ENCOUNTER — Ambulatory Visit (INDEPENDENT_AMBULATORY_CARE_PROVIDER_SITE_OTHER): Payer: Medicare Other | Admitting: Family Medicine

## 2012-12-10 VITALS — BP 130/80 | HR 90 | Temp 98.0°F | Resp 16 | Ht 62.0 in | Wt 189.0 lb

## 2012-12-10 DIAGNOSIS — M79672 Pain in left foot: Secondary | ICD-10-CM

## 2012-12-10 DIAGNOSIS — Z5189 Encounter for other specified aftercare: Secondary | ICD-10-CM

## 2012-12-10 DIAGNOSIS — M79609 Pain in unspecified limb: Secondary | ICD-10-CM

## 2012-12-10 DIAGNOSIS — S9032XD Contusion of left foot, subsequent encounter: Secondary | ICD-10-CM

## 2012-12-10 NOTE — Progress Notes (Signed)
[facility]:  Office Visit  Chief Complaint:  Chief Complaint  Patient presents with  . Foot Pain    left foot pain since last Wednesday am  follow up after wearing short cam walker    HPI: Jacqueline [name] is a 47 y.o. female who complains of  Left foot pain 8 days s/p fall in shower, she has constant pain, she has 8/10 pain. She has beenusing her walker and not bearing weight on it, she is using the cam boot, but she has had more her bruising and also swelling since her last OV. 8 days ago She was trying to take her shower when she sprained her ankle, she heard a crunch when her foot went backwards. She has had sharp pain, + bruising and swelling. She has had surgeries on that foot and ankle before, a failed foot surgery and also has had a peroneal rupture and heel repair with screws. She has DM x 1 year much better controlled. HbA1c per patient is around 6. She normally has burning on the tips of her toes but now she has numbness and tingling on her left 4,5th toes unchanged from last visit.  2003 1st surgery  2005 2nd surgery   Xrays from 12/02/12 LEFT ANKLE COMPLETE - 3+ VIEW  Comparison: None.  Findings: No acute bony or joint abnormality is identified. Screws  for fixation of a healed calcaneus fracture are intact. Soft  tissue structures are unremarkable.  IMPRESSION:  No acute abnormality.  LEFT FOOT - COMPLETE 3+ VIEW  Comparison: None.  Findings: There is no fracture or dislocation or other acute  abnormality. Two screws are present in the calcaneus. There is no  visible residual fracture line. There is slight dorsal spurring on  the navicular. There is an os peroneum adjacent to the cuboid, a  normal variant and not an old fracture.  IMPRESSION:  No acute abnormality.  Clinically significant discrepancy from primary report, if  provided: None     Past Medical History  Diagnosis Date  . Fatty liver   . IBS (irritable bowel syndrome)   . GERD  (gastroesophageal reflux disease)   . Anemia   . Anxiety disorder   . Arthritis   . Asthma   . Chronic headaches   . Kidney stones   . Pneumonia   . Obesity   . Erosive esophagitis   . Abnormal transaminases   . Sinus tachycardia   . Cellulitis   . DM (diabetes mellitus)   . Diverticulitis    Past Surgical History  Procedure Laterality Date  . Nasal septum surgery    . Ankle surgery    . Foot surgery    . Ovarian cysts      x3  . Tonsillectomy    . Tubal ligation    . Egd  06/09/2009  . Colonoscopy  06/19/2009  . Vaginal hysterectomy     History   Social History  . Marital Status: Married    Spouse Name: N/A    Number of Children: 3  . Years of Education: N/A   Occupational History  . Disabled    Social History Main Topics  . Smoking status: Former Research scientist (life sciences)  . Smokeless tobacco: Not on file  . Alcohol Use: No  . Drug Use: No  . Sexually Active: Not on file   Other Topics Concern  . Not on file   Social History Narrative  . No narrative on file   Family History  Problem Relation Age of Onset  . Colitis    . Crohn's disease    . Breast cancer    . Ovarian cancer    . Celiac disease    . Clotting disorder    . Cystic fibrosis      pat. cousin  . Diabetes Mother   . Heart disease    . Irritable bowel syndrome    . Kidney failure  31    mat. nephew  . Colon cancer Neg Hx   . Diabetes Maternal Aunt   . Diabetes Son   . Diabetes      mat. nephew   Allergies  Allergen Reactions  . Naproxen Sodium     All pain meds, has fatty liver  . Reglan (Metoclopramide)    Prior to Admission medications   Medication Sig Start Date End Date Taking? Authorizing Provider  atorvastatin (LIPITOR) 40 MG tablet Take 40 mg by mouth at bedtime.   Yes Historical Provider, MD  Blood Glucose Monitoring Suppl (ONE TOUCH ULTRA SYSTEM KIT) W/DEVICE KIT 1 kit by Does not apply route once.   Yes Historical Provider, MD  gabapentin (NEURONTIN) 600 MG tablet Take 600 mg by  mouth 3 (three) times daily as needed.   Yes Historical Provider, MD  glucose blood test strip Use as instructed 10/08/12  Yes Susy Frizzle, MD  metFORMIN (GLUCOPHAGE) 500 MG tablet Take 850 mg by mouth 2 (two) times daily with a meal.    Yes Historical Provider, MD  metoprolol succinate (TOPROL-XL) 50 MG 24 hr tablet Take 50 mg by mouth daily. Take with or immediately following a meal.   Yes Historical Provider, MD  ONE TOUCH LANCETS MISC One touch delica 97D lancets 5/32/99  Yes Susy Frizzle, MD  pantoprazole (PROTONIX) 40 MG tablet Take 1 tablet (40 mg total) by mouth daily. 08/05/12  Yes Susy Frizzle, MD  sitaGLIPtin (JANUVIA) 50 MG tablet Take 1 tablet (50 mg total) by mouth daily. 08/05/12  Yes Susy Frizzle, MD  SUMAtriptan Succinate (IMITREX PO) Take by mouth as needed.   Yes Historical Provider, MD  albuterol (PROVENTIL HFA;VENTOLIN HFA) 108 (90 BASE) MCG/ACT inhaler Inhale 2 puffs into the lungs every 6 (six) hours as needed.    Historical Provider, MD  EPINEPHrine (EPIPEN 2-PAK) 0.3 mg/0.3 mL DEVI Inject 0.3 mLs (0.3 mg total) into the muscle once. 09/15/12   Susy Frizzle, MD  Misc. Devices ([place]) MISC Pt is requesting a walker with wheels and a seat( any kind her insurance will cover is fine) Dx. 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 12/02/12   Beck Cofer P Blayne Garlick, DO     ROS: The patient denies fevers, chills, night sweats, unintentional weight loss, chest pain, palpitations, wheezing, dyspnea on exertion, nausea, vomiting, abdominal pain, dysuria, hematuria, melena, + numbness, weakness, or tingling.   All other systems have been reviewed and were otherwise negative with the exception of those mentioned in the HPI and as above.    PHYSICAL EXAM: Filed Vitals:    12/10/12 1828  BP: 130/80  Pulse: 90  Temp: 98 F (36.7 C)  Resp: 16   Filed Vitals:   12/10/12 1828  Height: 5' 2"  (1.575 m)  Weight: 189 lb (  85.73 kg)   Body mass index is 34.56 kg/(m^2).  General: Alert, no acute distress HEENT:  Normocephalic, atraumatic, oropharynx patent.  Cardiovascular:  Regular rate and rhythm, no rubs murmurs or gallops.  No Carotid bruits, radial pulse intact. No pedal edema.  Respiratory: Clear to auscultation bilaterally.  No wheezes, rales, or rhonchi.  No cyanosis, no use of accessory musculature GI: No organomegaly, abdomen is soft and non-tender, positive bowel sounds.  No masses. Skin: No rashes. Neurologic: Facial musculature symmetric. Psychiatric: Patient is appropriate throughout our interaction. Lymphatic: No cervical lymphadenopathy Musculoskeletal: Gait intact. + diffuse swelling, knot on lateral base of 5th MTP is gone since last visit but now there is more diffuse swelling , she has resolving ecchymosis ROM decrease in plantar flexion and extension, decrease sensation along lateral 4th 5th digit, unchanged from prior.  + posterior tib, faint.  Toes are warm   LABS: Results for orders placed in visit on 11/04/12  CBC WITH DIFFERENTIAL      Result Value Range   WBC 9.2  4.0 - 10.5 K/uL   RBC 4.58  3.87 - 5.11 MIL/uL   Hemoglobin 13.5  12.0 - 15.0 g/dL   HCT 40.4  36.0 - 46.0 %   MCV 88.2  78.0 - 100.0 fL   MCH 29.5  26.0 - 34.0 pg   MCHC 33.4  30.0 - 36.0 g/dL   RDW 13.3  11.5 - 15.5 %   Platelets 364  150 - 400 K/uL   Neutrophils Relative % 56  43 - 77 %   Neutro Abs 5.1  1.7 - 7.7 K/uL   Lymphocytes Relative 37  12 - 46 %   Lymphs Abs 3.4  0.7 - 4.0 K/uL   Monocytes Relative 5  3 - 12 %   Monocytes Absolute 0.4  0.1 - 1.0 K/uL   Eosinophils Relative 2  0 - 5 %   Eosinophils Absolute 0.2  0.0 - 0.7 K/uL   Basophils Relative 0  0 - 1 %   Basophils Absolute 0.0  0.0 - 0.1 K/uL   Smear Review Criteria for review not met     COMPREHENSIVE METABOLIC PANEL      Result Value Range   Sodium 143  135 - 145 mEq/L   Potassium 5.6 (*) 3.5 - 5.3 mEq/L   Chloride 103  96 - 112 mEq/L   CO2 27  19 - 32 mEq/L   Glucose, Bld 112 (*) 70 - 99 mg/dL   BUN 14  6 - 23 mg/dL   Creat 0.79  0.50 - 1.10 mg/dL   Total Bilirubin 0.4  0.3 - 1.2 mg/dL   Alkaline Phosphatase 71  39 - 117 U/L   AST 36  0 - 37 U/L   ALT 70 (*) 0 - 35 U/L   Total Protein 7.4  6.0 - 8.3 g/dL   Albumin 4.4  3.5 - 5.2 g/dL   Calcium 10.3  8.4 - 10.5 mg/dL  HEMOGLOBIN A1C      Result Value Range   Hemoglobin A1C 6.2 (*) <5.7 %   Mean Plasma Glucose 131 (*) <117 mg/dL  LIPID PANEL      Result Value Range   Cholesterol 189  0 - 200 mg/dL   Triglycerides 154 (*) <150 mg/dL   HDL 45  >39 mg/dL   Total CHOL/HDL Ratio 4.2     VLDL 31  0 - 40 mg/dL   LDL Cholesterol 113 (*) 0 - 99 mg/dL  EKG/XRAY:   Primary read interpreted by Dr. Marin Comment at [facility]. Shadow/artifact vs DJD vs possible fracture on 2nd, 3rd cuneiform  Please comment of lucency of proximal area of 2nd Metarsal   ASSESSMENT/PLAN: Encounter Diagnoses  Name Primary?  . Left foot pain Yes  . Contusion of left foot, subsequent encounter    I have written for a walker with a seat so she can rest her foot, she gets off balance with crutches and the current walker she has does not allow her to rest  She will continue with camboot and nonweightbearing and increase as tolerated Declined any pain meds, tried tramadol without relief RICE We will try to get her an appointment with Dr. Doran Durand since she needs a foot surgeon, I have asked if she wants to see a foot and ankle orthopedist in [place] but that is too far for her F/u in 2 weeks otherwise prn , hopefully she will be able to get in to see Dr. Doran Durand  There was an issue about how long ago her last surgery was, the last surgery was about 9.5 years ago and was done by Dr, Linton Rump at [facility] which has been shut down.  She has tried calling  [facility] to get records where she had her surgery  but they cannot find the old records and may have them in storage or expunged them. However, I myself have called [facility] and asked for a referral so she can be seen and was told that they have a "second opinion" policy that if a surgery was done by another doctor than they need all records if it has not been over 10 years.  I will go ahead and refer her to Dr. Meridee Score. The patient will still try to get records.     Naasia Weilbacher, [place], DO 12/12/2012 12:35 AM    Xray results from 7/31 below and discussed with patient: LEFT FOOT - COMPLETE 3+ VIEW  Comparison: 12/02/2012  Findings: The foot continues to show no evidence of fracture or  dislocation. Very mild degenerative changes are seen at the level  of the tarsal bones and particularly the navicular. Two separate  screws are again identified in the calcaneus and show stable  appearance without evidence of hardware fracture. There is  suggestion of minimal lucency surrounding the screws which is  likely not significant. No soft tissue abnormalities are seen.  IMPRESSION:  Stable appearance of the left foot with no worrisome acute  findings. Mild degenerative changes are seen at the level of the  tarsal bones.

## 2012-12-11 ENCOUNTER — Telehealth: Payer: Self-pay | Admitting: Radiology

## 2012-12-11 DIAGNOSIS — M79672 Pain in left foot: Secondary | ICD-10-CM

## 2012-12-11 NOTE — Telephone Encounter (Signed)
[facility] needs order for rolling walker with seat. This is sent., Jacqueline [name]

## 2012-12-14 ENCOUNTER — Telehealth: Payer: Self-pay

## 2012-12-14 NOTE — Telephone Encounter (Signed)
Pt would like to talk with dr Marin Comment personally regarding an orthopedic referral

## 2012-12-16 ENCOUNTER — Other Ambulatory Visit: Payer: Self-pay | Admitting: Family Medicine

## 2012-12-16 NOTE — Telephone Encounter (Signed)
She knows she has an appt with Dr Sharol Given tomorrow, her foot pain is similar , she has more swelling and throbbing, she got her records from [facility] and will bring them to the appt. She also got her new sitting walker.

## 2012-12-17 ENCOUNTER — Other Ambulatory Visit: Payer: Self-pay | Admitting: Family Medicine

## 2012-12-17 ENCOUNTER — Encounter (INDEPENDENT_AMBULATORY_CARE_PROVIDER_SITE_OTHER): Payer: Medicare Other | Admitting: *Deleted

## 2012-12-17 DIAGNOSIS — R0989 Other specified symptoms and signs involving the circulatory and respiratory systems: Secondary | ICD-10-CM

## 2012-12-22 ENCOUNTER — Telehealth: Payer: Self-pay | Admitting: Family Medicine

## 2012-12-22 MED ORDER — METOPROLOL SUCCINATE ER 50 MG PO TB24: 50.0000 mg | ORAL_TABLET | Freq: Every day | ORAL | Status: DC

## 2012-12-22 NOTE — Telephone Encounter (Signed)
Rx Refilled  

## 2012-12-22 NOTE — Telephone Encounter (Signed)
Metoprolol Succ ER 50 mg tab 1 QD #90  She is now using [facility] on [address], but it will not let me select it for the pharmacy. Their phone number is [phone] and their fax number is [phone].

## 2012-12-25 NOTE — Telephone Encounter (Signed)
This was done 12/03/12

## 2013-03-18 ENCOUNTER — Other Ambulatory Visit: Payer: Self-pay

## 2013-03-25 ENCOUNTER — Encounter (HOSPITAL_COMMUNITY): Payer: Self-pay | Admitting: Emergency Medicine

## 2013-03-25 ENCOUNTER — Emergency Department (HOSPITAL_COMMUNITY)
Admission: EM | Admit: 2013-03-25 | Discharge: 2013-03-25 | Disposition: A | Discharge status: Home / Self Care | Payer: Medicare Other | Attending: Emergency Medicine | Admitting: Emergency Medicine

## 2013-03-25 ENCOUNTER — Other Ambulatory Visit: Payer: Self-pay | Admitting: Family Medicine

## 2013-03-25 DIAGNOSIS — J45909 Unspecified asthma, uncomplicated: Secondary | ICD-10-CM | POA: Insufficient documentation

## 2013-03-25 DIAGNOSIS — E119 Type 2 diabetes mellitus without complications: Secondary | ICD-10-CM | POA: Insufficient documentation

## 2013-03-25 DIAGNOSIS — G43909 Migraine, unspecified, not intractable, without status migrainosus: Secondary | ICD-10-CM | POA: Insufficient documentation

## 2013-03-25 DIAGNOSIS — G8929 Other chronic pain: Secondary | ICD-10-CM | POA: Insufficient documentation

## 2013-03-25 DIAGNOSIS — Z8701 Personal history of pneumonia (recurrent): Secondary | ICD-10-CM | POA: Insufficient documentation

## 2013-03-25 DIAGNOSIS — Z862 Personal history of diseases of the blood and blood-forming organs and certain disorders involving the immune mechanism: Secondary | ICD-10-CM | POA: Insufficient documentation

## 2013-03-25 DIAGNOSIS — F411 Generalized anxiety disorder: Secondary | ICD-10-CM | POA: Insufficient documentation

## 2013-03-25 DIAGNOSIS — Z872 Personal history of diseases of the skin and subcutaneous tissue: Secondary | ICD-10-CM | POA: Insufficient documentation

## 2013-03-25 DIAGNOSIS — Z7982 Long term (current) use of aspirin: Secondary | ICD-10-CM | POA: Insufficient documentation

## 2013-03-25 DIAGNOSIS — K219 Gastro-esophageal reflux disease without esophagitis: Secondary | ICD-10-CM | POA: Insufficient documentation

## 2013-03-25 DIAGNOSIS — M129 Arthropathy, unspecified: Secondary | ICD-10-CM | POA: Insufficient documentation

## 2013-03-25 DIAGNOSIS — Z79899 Other long term (current) drug therapy: Secondary | ICD-10-CM | POA: Insufficient documentation

## 2013-03-25 DIAGNOSIS — Z87891 Personal history of nicotine dependence: Secondary | ICD-10-CM | POA: Insufficient documentation

## 2013-03-25 DIAGNOSIS — E669 Obesity, unspecified: Secondary | ICD-10-CM | POA: Insufficient documentation

## 2013-03-25 DIAGNOSIS — Z87442 Personal history of urinary calculi: Secondary | ICD-10-CM | POA: Insufficient documentation

## 2013-03-25 HISTORY — DX: Benign intracranial hypertension: G93.2

## 2013-03-25 MED ORDER — DIPHENHYDRAMINE HCL 50 MG/ML IJ SOLN
25.0000 mg | Freq: Once | INTRAMUSCULAR | Status: AC
  Administered 2013-03-25: 25 mg via INTRAVENOUS
  Filled 2013-03-25: qty 1

## 2013-03-25 MED ORDER — SODIUM CHLORIDE 0.9 % IV SOLN
1000.0000 mL | Freq: Once | INTRAVENOUS | Status: AC
  Administered 2013-03-25: 1000 mL via INTRAVENOUS

## 2013-03-25 MED ORDER — DEXAMETHASONE SODIUM PHOSPHATE 4 MG/ML IJ SOLN
10.0000 mg | Freq: Once | INTRAMUSCULAR | Status: AC
  Administered 2013-03-25: 10 mg via INTRAVENOUS
  Filled 2013-03-25: qty 3

## 2013-03-25 MED ORDER — MAGNESIUM SULFATE 40 MG/ML IJ SOLN
2.0000 g | Freq: Once | INTRAMUSCULAR | Status: AC
  Administered 2013-03-25: 2 g via INTRAVENOUS
  Filled 2013-03-25: qty 50

## 2013-03-25 MED ORDER — DEXTROSE 5 % IV SOLN
1000.0000 mg | Freq: Once | INTRAVENOUS | Status: AC
  Administered 2013-03-25: 1000 mg via INTRAVENOUS
  Filled 2013-03-25: qty 10

## 2013-03-25 MED ORDER — SODIUM CHLORIDE 0.9 % IV SOLN
1000.0000 mL | INTRAVENOUS | Status: DC
  Administered 2013-03-25: 1000 mL via INTRAVENOUS

## 2013-03-25 MED ORDER — PROCHLORPERAZINE EDISYLATE 5 MG/ML IJ SOLN
10.0000 mg | Freq: Once | INTRAMUSCULAR | Status: AC
  Administered 2013-03-25: 10 mg via INTRAVENOUS
  Filled 2013-03-25: qty 2

## 2013-03-25 NOTE — ED Provider Notes (Signed)
CSN: [id number]     Arrival date & time 03/25/13  1021 History  This chart was scribed for Janice Norrie, MD by Roxan Diesel, ED scribe.  This patient was seen in room APA12/APA12 and the patient's care was started at 11:16 AM.   Chief Complaint  Patient presents with  . Headache    The history is provided by the patient. No language interpreter was used.    HPI Comments: NALEYAH OHLINGER is a 47 y.o. female who presents to the Emergency Department complaining of a constant, moderate, gradual-onset, waxing-and-waning throbbing  Pressure headache that began 5 days ago with associated nausea and blurred vision.  Pt describes pain as pressure to the left side of her head "like a balloon being blowed up and released."  It is not made worse or better by anything.  She has attempted to treat pain with oral  Imitrex, without relief.  She denies vomiting or new numbness or tingling in arms or legs (has neuropathy),  Pt has had migraines since age 89 and has h/o pseudotumor cerebri.  She states her present pain is more similar to pseudotumor cerebri symptoms.  Pt has a neurologist but was not able to schedule an appointment for any time soon.  She notes that she used to be on Lasix for her pseudotumor cerebri but is no longer taking this. She states she takes her oral imitrex 1-2 times a month  Pt smokes "once in a while."  She does not drink.  She is on disability for migraines.  PCP Dr Dennard Schaumann Neurology Dr [facility]   Past Medical History  Diagnosis Date  . Fatty liver   . IBS (irritable bowel syndrome)   . GERD (gastroesophageal reflux disease)   . Anemia   . Anxiety disorder   . Arthritis   . Asthma   . Chronic headaches   . Kidney stones   . Pneumonia   . Obesity   . Erosive esophagitis   . Abnormal transaminases   . Sinus tachycardia   . Cellulitis   . DM (diabetes mellitus)   . Diverticulitis   . Pseudotumor cerebri     Past Surgical History  Procedure  Laterality Date  . Nasal septum surgery    . Ankle surgery    . Foot surgery    . Ovarian cysts      x3  . Tonsillectomy    . Tubal ligation    . Egd  06/09/2009  . Colonoscopy  06/19/2009  . Vaginal hysterectomy      Family History  Problem Relation Age of Onset  . Colitis    . Crohn's disease    . Breast cancer    . Ovarian cancer    . Celiac disease    . Clotting disorder    . Cystic fibrosis      pat. cousin  . Diabetes Mother   . Heart disease    . Irritable bowel syndrome    . Kidney failure  31    mat. nephew  . Colon cancer Neg Hx   . Diabetes Maternal Aunt   . Diabetes Son   . Diabetes      mat. nephew    History  Substance Use Topics  . Smoking status: Former Research scientist (life sciences)  . Smokeless tobacco: Not on file  . Alcohol Use: No  on disability for migraines + second hand smoke Smokes occassionally  OB History   Grav Para Term Preterm Abortions  TAB SAB Ect Mult Living                  Review of Systems  Eyes: Positive for visual disturbance.  Gastrointestinal: Positive for nausea. Negative for vomiting.  Neurological: Positive for headaches.  All other systems reviewed and are negative.     Allergies  Naproxen sodium and Reglan  Home Medications   Current Outpatient Rx  Name  Route  Sig  Dispense  Refill  . aspirin EC 81 MG tablet   Oral   Take 81 mg by mouth daily.         Marland Kitchen atorvastatin (LIPITOR) 40 MG tablet      TAKE ONE TABLET BY MOUTH DAILY   90 tablet   3   . cetirizine (ZYRTEC) 10 MG tablet   Oral   Take 10 mg by mouth daily.         Marland Kitchen esomeprazole (NEXIUM) 40 MG capsule   Oral   Take 40 mg by mouth at bedtime.         . gabapentin (NEURONTIN) 600 MG tablet   Oral   Take 300-1,200 mg by mouth 3 (three) times daily as needed.          . metFORMIN (GLUCOPHAGE) 850 MG tablet   Oral   Take 850 mg by mouth 2 (two) times daily with a meal.         . metoprolol succinate (TOPROL-XL) 50 MG 24 hr tablet   Oral   Take 1  tablet (50 mg total) by mouth daily. Take with or immediately following a meal.   90 tablet   1   . sitaGLIPtin (JANUVIA) 50 MG tablet   Oral   Take 1 tablet (50 mg total) by mouth daily.   30 tablet   5   . SUMAtriptan Succinate (IMITREX PO)   Oral   Take 50-100 mg by mouth as needed (1/2 tablet to 1 tablet at onset of headache, may repeat in 2 hours).          Marland Kitchen albuterol (PROVENTIL HFA;VENTOLIN HFA) 108 (90 BASE) MCG/ACT inhaler   Inhalation   Inhale 2 puffs into the lungs every 6 (six) hours as needed.         . Blood Glucose Monitoring Suppl (ONE TOUCH ULTRA SYSTEM KIT) W/DEVICE KIT   Does not apply   1 kit by Does not apply route once.         Marland Kitchen EPINEPHrine (EPIPEN 2-PAK) 0.3 mg/0.3 mL DEVI   Intramuscular   Inject 0.3 mLs (0.3 mg total) into the muscle once.   6 Device   1   . glucose blood test strip      Use as instructed   100 each   2   . Misc. Devices (HUGO ROLLING WALKER) MISC      Pt is requesting a walker with wheels and a seat( any kind her insurance will cover is fine) Dx. 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 ONE TOUCH LANCETS MISC      One touch delica 10R lancets   159 each   3    BP 140/90  Pulse 88  Temp(Src) 97.8 F (36.6 C) (Oral)  Resp 16  Ht 5' 2"  (1.575 m)  Wt 195 lb (88.451 kg)  BMI 35.66 kg/m2  SpO2 95%  Vital signs normal    Physical  Exam  Nursing note and vitals reviewed. Constitutional: She is oriented to person, place, and time. She appears well-developed and well-nourished.  Non-toxic appearance. She does not appear ill. No distress.  HENT:  Head: Normocephalic and atraumatic.  Right Ear: External ear normal.  Left Ear: External ear normal.  Nose: Nose normal. No mucosal edema or rhinorrhea.  Mouth/Throat: Oropharynx is clear and moist and mucous membranes are normal. No dental abscesses or uvula swelling.  Eyes: Conjunctivae and EOM are normal. Pupils are  equal, round, and reactive to light.  Narrowing of blood vessels Optic disc is sharp without bulging  Neck: Normal range of motion and full passive range of motion without pain. Neck supple.  Cardiovascular: Normal rate, regular rhythm and normal heart sounds.  Exam reveals no gallop and no friction rub.   No murmur heard. Pulmonary/Chest: Effort normal and breath sounds normal. No respiratory distress. She has no wheezes. She has no rhonchi. She has no rales. She exhibits no tenderness and no crepitus.  Abdominal: Soft. Normal appearance and bowel sounds are normal. She exhibits no distension. There is no tenderness. There is no rebound and no guarding.  Musculoskeletal: Normal range of motion. She exhibits no edema and no tenderness.  Moves all extremities well.   Neurological: She is alert and oriented to person, place, and time. She has normal strength. No cranial nerve deficit.  Skin: Skin is warm, dry and intact. No rash noted. No erythema. No pallor.  Psychiatric: She has a normal mood and affect. Her speech is normal and behavior is normal. Her mood appears not anxious.    ED Course  Procedures (including critical care time)  Medications  0.9 %  sodium chloride infusion (0 mLs Intravenous Stopped 03/25/13 1330)    Followed by  0.9 %  sodium chloride infusion (1,000 mLs Intravenous New Bag/Given 03/25/13 1139)  prochlorperazine (COMPAZINE) injection 10 mg (10 mg Intravenous Given 03/25/13 1139)  diphenhydrAMINE (BENADRYL) injection 25 mg (25 mg Intravenous Given 03/25/13 1139)  dexamethasone (DECADRON) injection 10 mg (10 mg Intravenous Given 03/25/13 1139)  valproate (DEPACON) 1,000 mg in dextrose 5 % 50 mL IVPB (0 mg Intravenous Stopped 03/25/13 1535)  magnesium sulfate IVPB 2 g 50 mL (0 g Intravenous Stopped 03/25/13 1420)     DIAGNOSTIC STUDIES: Oxygen Saturation is 95% on room air, adequate by my interpretation.    COORDINATION OF CARE: 11:25 AM: Discussed treatment plan  which includes migraine cocktail.  Pt expressed understanding and agreed to plan.   Recheck 13:15 headache better, but not gone  15:50 headache almost gone. Feels ready to go home.   Labs Review Labs Reviewed - No data to display  Imaging Review No results found.  EKG Interpretation   None       MDM   1. Headache     Plan discharge   Rolland Porter, MD, FACEP   I personally performed the services described in this documentation, which was scribed in my presence. The recorded information has been reviewed and considered.     Janice Norrie, MD 03/25/13 [phone]

## 2013-03-25 NOTE — ED Notes (Signed)
Headache x 4 days with nausea.  Denies vomiting.

## 2013-03-30 ENCOUNTER — Other Ambulatory Visit: Payer: Self-pay | Admitting: Family Medicine

## 2013-03-30 MED ORDER — METOPROLOL SUCCINATE ER 50 MG PO TB24: 50.0000 mg | ORAL_TABLET | Freq: Every day | ORAL | Status: DC

## 2013-03-30 NOTE — Telephone Encounter (Signed)
Rx Refilled  

## 2013-04-02 ENCOUNTER — Other Ambulatory Visit: Payer: Self-pay | Admitting: Family Medicine

## 2013-04-02 MED ORDER — METOPROLOL SUCCINATE ER 50 MG PO TB24: 50.0000 mg | ORAL_TABLET | Freq: Every day | ORAL | Status: DC

## 2013-04-02 NOTE — Telephone Encounter (Signed)
Rx Refilled  

## 2013-04-09 ENCOUNTER — Telehealth: Payer: Self-pay | Admitting: Family Medicine

## 2013-04-09 MED ORDER — METOPROLOL SUCCINATE ER 50 MG PO TB24: 50.0000 mg | ORAL_TABLET | Freq: Every day | ORAL | Status: DC

## 2013-04-09 NOTE — Telephone Encounter (Signed)
Req Rf Metoprolol Succinate ER 47m  Medication refilled per protocol.

## 2013-04-13 ENCOUNTER — Other Ambulatory Visit: Payer: Self-pay | Admitting: Family Medicine

## 2013-04-13 ENCOUNTER — Encounter: Payer: Self-pay | Admitting: Family Medicine

## 2013-04-13 ENCOUNTER — Telehealth: Payer: Self-pay | Admitting: Family Medicine

## 2013-04-13 DIAGNOSIS — E119 Type 2 diabetes mellitus without complications: Secondary | ICD-10-CM

## 2013-04-13 MED ORDER — METOPROLOL SUCCINATE ER 50 MG PO TB24: 50.0000 mg | ORAL_TABLET | Freq: Every day | ORAL | Status: DC

## 2013-04-13 NOTE — Telephone Encounter (Signed)
Medication refill for one time only.  Patient needs to be seen.  Letter sent for patient to call and schedule 

## 2013-04-13 NOTE — Progress Notes (Signed)
Records indicate that patient is due for Urine Micro Albumin.  Tried to call patient, no answer, unable to leave mess.  Lab ordered and reminder letter sent to patient.

## 2013-04-19 ENCOUNTER — Other Ambulatory Visit: Payer: Self-pay | Admitting: Family Medicine

## 2013-04-19 MED ORDER — METOPROLOL SUCCINATE ER 50 MG PO TB24: 50.0000 mg | ORAL_TABLET | Freq: Every day | ORAL | Status: DC

## 2013-04-19 NOTE — Telephone Encounter (Signed)
Rx Refilled  

## 2013-04-20 ENCOUNTER — Telehealth: Payer: Self-pay | Admitting: Family Medicine

## 2013-04-20 NOTE — Telephone Encounter (Signed)
Metoprolol was refilled yesterday.

## 2013-05-21 ENCOUNTER — Telehealth: Payer: Self-pay | Admitting: Family Medicine

## 2013-05-21 NOTE — Telephone Encounter (Signed)
Per Dr. Dennard Schaumann treat symptoms with OTC medication until Monday and if no better by then we will work her in. Robitussin. Treat with Robitussin and Mucinex DM

## 2013-05-21 NOTE — Telephone Encounter (Signed)
pts husband was seen at [facility] and given antibx and steroids and now she has the same thing. She is unable to get into see you and would like to know if you can call her in something and she will keep her appt for next week.?

## 2013-05-21 NOTE — Telephone Encounter (Signed)
PT is wanting to speak to you about getting a prescription for her Symp Headaches,stopped up, congestion in chest,coughing,little fever (99-100),chills Call back number is [phone]

## 2013-05-22 ENCOUNTER — Emergency Department (INDEPENDENT_AMBULATORY_CARE_PROVIDER_SITE_OTHER): Payer: Medicare Other

## 2013-05-22 ENCOUNTER — Emergency Department (HOSPITAL_COMMUNITY)
Admission: EM | Admit: 2013-05-22 | Discharge: 2013-05-22 | Disposition: A | Discharge status: Home / Self Care | Payer: Medicare Other | Source: Home / Self Care | Attending: Family Medicine | Admitting: Family Medicine

## 2013-05-22 ENCOUNTER — Encounter (HOSPITAL_COMMUNITY): Payer: Self-pay | Admitting: Emergency Medicine

## 2013-05-22 DIAGNOSIS — J189 Pneumonia, unspecified organism: Secondary | ICD-10-CM

## 2013-05-22 MED ORDER — HYDROCODONE-ACETAMINOPHEN 5-325 MG PO TABS: 0.5000 | ORAL_TABLET | Freq: Every evening | ORAL | Status: DC | PRN

## 2013-05-22 MED ORDER — LEVOFLOXACIN 500 MG PO TABS: 500.0000 mg | ORAL_TABLET | Freq: Every day | ORAL | Status: DC

## 2013-05-22 MED ORDER — BENZONATATE 100 MG PO CAPS: 100.0000 mg | ORAL_CAPSULE | Freq: Three times a day (TID) | ORAL | Status: DC

## 2013-05-22 NOTE — ED Notes (Signed)
Pt c/o cold sxs onset 3 days w/sxs that include: congestion, HA, ST, cough, fever, SOB Denies: f/v/n/d... Taking OTC cold meds w/no relief Alert w/no signs of acute distress.

## 2013-05-22 NOTE — ED Provider Notes (Signed)
Jacqueline [name] is a 48 y.o. female who presents to Urgent Care today for 3 days of cough congestion runny nose, nosebleeds fatigue headache chills sweats. Patient notes occasional mild shortness of breath. She denies any vomiting or diarrhea. She's tried multiple over-the-counter medications which have not helped much. She notes that her husband was recently diagnosed with a viral illness.   Past Medical History  Diagnosis Date  . Fatty liver   . IBS (irritable bowel syndrome)   . GERD (gastroesophageal reflux disease)   . Anemia   . Anxiety disorder   . Arthritis   . Asthma   . Chronic headaches   . Kidney stones   . Pneumonia   . Obesity   . Erosive esophagitis   . Abnormal transaminases   . Sinus tachycardia   . Cellulitis   . DM (diabetes mellitus)   . Diverticulitis   . Pseudotumor cerebri    History  Substance Use Topics  . Smoking status: Former Research scientist (life sciences)  . Smokeless tobacco: Not on file  . Alcohol Use: No   ROS as above Medications reviewed. No current facility-administered medications for this encounter.   Current Outpatient Prescriptions  Medication Sig Dispense Refill  . albuterol (PROVENTIL HFA;VENTOLIN HFA) 108 (90 BASE) MCG/ACT inhaler Inhale 2 puffs into the lungs every 6 (six) hours as needed.      Marland Kitchen aspirin EC 81 MG tablet Take 81 mg by mouth daily.      Marland Kitchen atorvastatin (LIPITOR) 40 MG tablet TAKE ONE TABLET BY MOUTH DAILY  90 tablet  3  . benzonatate (TESSALON) 100 MG capsule Take 1 capsule (100 mg total) by mouth every 8 (eight) hours.  21 capsule  0  . Blood Glucose Monitoring Suppl (ONE TOUCH ULTRA SYSTEM KIT) W/DEVICE KIT 1 kit by Does not apply route once.      . cetirizine (ZYRTEC) 10 MG tablet Take 10 mg by mouth daily.      Marland Kitchen EPINEPHrine (EPIPEN 2-PAK) 0.3 mg/0.3 mL DEVI Inject 0.3 mLs (0.3 mg total) into the muscle once.  6 Device  1  . esomeprazole (NEXIUM) 40 MG capsule Take 40 mg by mouth at bedtime.      . gabapentin (NEURONTIN) 600 MG tablet  Take 300-1,200 mg by mouth 3 (three) times daily as needed.       Marland Kitchen HYDROcodone-acetaminophen (NORCO/VICODIN) 5-325 MG per tablet Take 0.5 tablets by mouth at bedtime as needed (cough).  10 tablet  0  . levofloxacin (LEVAQUIN) 500 MG tablet Take 1 tablet (500 mg total) by mouth daily.  7 tablet  0  . metFORMIN (GLUCOPHAGE) 850 MG tablet TAKE ONE TABLET BY MOUTH TWICE DAILY  180 tablet  2  . metFORMIN (GLUCOPHAGE) 850 MG tablet Take 850 mg by mouth 2 (two) times daily with a meal.      . metoprolol succinate (TOPROL-XL) 50 MG 24 hr tablet Take 1 tablet (50 mg total) by mouth daily. Take with or immediately following a meal.  90 tablet  0  . Misc. Devices (HUGO ROLLING WALKER) MISC Pt is requesting a walker with wheels and a seat( any kind her insurance will cover is fine) Dx. 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 ONE TOUCH LANCETS MISC One touch delica 72C lancets  947 each  3  . ONE TOUCH ULTRA TEST test strip USE TO TEST BLOOD SUGAR AS DIRECTED  100 each  6  . sitaGLIPtin (JANUVIA) 50 MG tablet Take 1 tablet (50 mg total) by mouth daily.  30 tablet  5  . SUMAtriptan Succinate (IMITREX PO) Take 50-100 mg by mouth as needed (1/2 tablet to 1 tablet at onset of headache, may repeat in 2 hours).         Exam:  BP 137/89  Pulse 84  Temp(Src) 98.2 F (36.8 C) (Oral)  Resp 18  SpO2 96% Gen: Well NAD HEENT: EOMI,  MMM is a turbinates are inflamed. Left nasal turbinate with a blood clot. Today membranes are normal appearing bilaterally. Posterior pharynx with cobblestoning. Lungs: Normal work of breathing. CTABL Heart: RRR no MRG Abd: NABS, Soft. NT, ND Exts: Non edematous BL  LE, warm and well perfused.   No results found for this or any previous visit (from the past 24 hour(s)). Dg Chest 2 View  05/22/2013   CLINICAL DATA:  Cough, congestion  EXAM: CHEST  2 VIEW  COMPARISON:  None.  FINDINGS: Cardiomediastinal silhouette is stable. No pulmonary  edema. Bony thorax is stable. There is linear atelectasis, scarring or early infiltrate in lingula.  IMPRESSION: No pulmonary edema. Linear atelectasis, scarring or infiltrate in lingula.   Electronically Signed   By: Lahoma Crocker M.D.   On: 05/22/2013 09:35    Assessment and Plan: 48 y.o. female with community-acquired pneumonia. Plan to treat with Levaquin. Additionally we'll treat cough with Tessalon Perles and hydrocodone and cough medication. Recommend patient followup with her primary care provider.   Discussed warning signs or symptoms. Please see discharge instructions. Patient expresses understanding.    Gregor Hams, MD 05/22/13 1007

## 2013-05-22 NOTE — Discharge Instructions (Signed)
Thank you for coming in today. Used one half Norco at bedtime for cough as needed. Take Tessalon Perles as well for cough. Take Levaquin daily for 7 days for pneumonia. Followup with your primary care provider. Call or go to the emergency room if you get worse, have trouble breathing, have chest pains, or palpitations.

## 2013-05-27 ENCOUNTER — Other Ambulatory Visit: Payer: Medicare Other

## 2013-05-27 DIAGNOSIS — E119 Type 2 diabetes mellitus without complications: Secondary | ICD-10-CM

## 2013-05-27 LAB — MICROALBUMIN, URINE: MICROALB UR: 0.96 mg/dL (ref 0.00–1.89)

## 2013-05-28 ENCOUNTER — Ambulatory Visit (INDEPENDENT_AMBULATORY_CARE_PROVIDER_SITE_OTHER): Payer: Medicare Other | Admitting: Family Medicine

## 2013-05-28 ENCOUNTER — Encounter: Payer: Self-pay | Admitting: Family Medicine

## 2013-05-28 VITALS — BP 128/90 | HR 98 | Temp 97.5°F | Resp 20 | Ht 62.0 in | Wt 199.0 lb

## 2013-05-28 DIAGNOSIS — E785 Hyperlipidemia, unspecified: Secondary | ICD-10-CM

## 2013-05-28 DIAGNOSIS — E119 Type 2 diabetes mellitus without complications: Secondary | ICD-10-CM

## 2013-05-28 DIAGNOSIS — I10 Essential (primary) hypertension: Secondary | ICD-10-CM

## 2013-05-28 MED ORDER — HYDROCODONE-HOMATROPINE 5-1.5 MG/5ML PO SYRP: 5.0000 mL | ORAL_SOLUTION | Freq: Three times a day (TID) | ORAL | Status: DC | PRN

## 2013-05-28 NOTE — Progress Notes (Signed)
Subjective:    Patient ID: Jacqueline [name], female    DOB: 01/30/1966, 48 y.o.   MRN: [id number]  HPI Patient is here for followup of her diabetes mellitus type 2. She is currently taking metformin and an AVM. She states her fasting blood sugars typically range around 130.  She denies any hypoglycemia. She denies any polyuria polydipsia or blurred vision. 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 She's also having a difficult time with weight loss. Her blood pressure is well-controlled today. She denies any chest pain or shortness of breath. She was recently diagnosed with community-acquired pneumonia and was given Levaquin. Her symptoms are only slightly better. However the chest x-ray x-ray showed no infiltrate. The she is afebrile her lungs are clear on examination.  Chest has a history of fatty liver disease and hyperlipidemia. She's currently taking atorvastatin 40 mg by mouth daily. She is due check a fasting lipid panel as well as LFTs. Her recent urine microalbumin was within normal limits. Past Medical History  Diagnosis Date  . Fatty liver   . IBS (irritable bowel syndrome)   . GERD (gastroesophageal reflux disease)   . Anemia   . Anxiety disorder   . Arthritis   . Asthma   . Chronic headaches   . Kidney stones   . Pneumonia   . Obesity   . Erosive esophagitis   . Abnormal transaminases   . Sinus tachycardia   . Cellulitis   . DM (diabetes mellitus)   . Diverticulitis   . Pseudotumor cerebri    Current Outpatient Prescriptions on File Prior to Visit  Medication Sig Dispense Refill  . albuterol (PROVENTIL HFA;VENTOLIN HFA) 108 (90 BASE) MCG/ACT inhaler Inhale 2 puffs into the lungs every 6 (six) hours as needed.      Marland Kitchen aspirin EC 81 MG tablet Take 81 mg by mouth daily.      Marland Kitchen atorvastatin (LIPITOR) 40 MG tablet TAKE ONE TABLET BY MOUTH DAILY  90 tablet  3  . benzonatate (TESSALON) 100 MG capsule Take 1 capsule (100 mg total) by mouth every 8 (eight) hours.  21  capsule  0  . Blood Glucose Monitoring Suppl (ONE TOUCH ULTRA SYSTEM KIT) W/DEVICE KIT 1 kit by Does not apply route once.      . cetirizine (ZYRTEC) 10 MG tablet Take 10 mg by mouth daily.      Marland Kitchen EPINEPHrine (EPIPEN 2-PAK) 0.3 mg/0.3 mL DEVI Inject 0.3 mLs (0.3 mg total) into the muscle once.  6 Device  1  . esomeprazole (NEXIUM) 40 MG capsule Take 40 mg by mouth at bedtime.      . gabapentin (NEURONTIN) 600 MG tablet Take 300-1,200 mg by mouth 3 (three) times daily as needed.       Marland Kitchen HYDROcodone-acetaminophen (NORCO/VICODIN) 5-325 MG per tablet Take 0.5 tablets by mouth at bedtime as needed (cough).  10 tablet  0  . levofloxacin (LEVAQUIN) 500 MG tablet Take 1 tablet (500 mg total) by mouth daily.  7 tablet  0  . metFORMIN (GLUCOPHAGE) 850 MG tablet TAKE ONE TABLET BY MOUTH TWICE DAILY  180 tablet  2  . metoprolol succinate (TOPROL-XL) 50 MG 24 hr tablet Take 1 tablet (50 mg total) by mouth daily. Take with or immediately following a meal.  90 tablet  0  . Misc. Devices ([place]) MISC Pt is requesting a walker with wheels and a seat( any kind her insurance will cover is fine) Dx. 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 ONE TOUCH LANCETS MISC One touch delica 51Z lancets  001 each  3  . ONE TOUCH ULTRA TEST test strip USE TO TEST BLOOD SUGAR AS DIRECTED  100 each  6  . sitaGLIPtin (JANUVIA) 50 MG tablet Take 1 tablet (50 mg total) by mouth daily.  30 tablet  5  . SUMAtriptan Succinate (IMITREX PO) Take 50-100 mg by mouth as needed (1/2 tablet to 1 tablet at onset of headache, may repeat in 2 hours).        No current facility-administered medications on file prior to visit.   Allergies  Allergen Reactions  . Naproxen Sodium     All pain meds, has fatty liver  . Reglan [Metoclopramide]    History   Social History  . Marital Status: Married    Spouse Name: N/A    Number of Children: 3  . Years of Education: N/A   Occupational  History  . Disabled    Social History Main Topics  . Smoking status: Former Research scientist (life sciences)  . Smokeless tobacco: Not on file  . Alcohol Use: No  . Drug Use: No  . Sexual Activity: Not on file   Other Topics Concern  . Not on file   Social History Narrative  . No narrative on file      Review of Systems  All other systems reviewed and are negative.       Objective:   Physical Exam  Vitals reviewed. Constitutional: She is oriented to person, place, and time.  Eyes: Conjunctivae are normal.  Neck: Neck supple. No JVD present. No thyromegaly present.  Cardiovascular: Normal rate, regular rhythm, normal heart sounds and intact distal pulses.  Exam reveals no gallop and no friction rub.   No murmur heard. Pulmonary/Chest: Effort normal and breath sounds normal. No respiratory distress. She has no wheezes. She has no rales. She exhibits no tenderness.  Abdominal: Soft. Bowel sounds are normal. She exhibits no distension. There is no tenderness. There is no rebound and no guarding.  Musculoskeletal: Normal range of motion. She exhibits no edema and no tenderness.  Lymphadenopathy:    She has no cervical adenopathy.  Neurological: She is alert and oriented to person, place, and time. She has normal reflexes. She displays normal reflexes. No cranial nerve deficit. She exhibits normal muscle tone. Coordination normal.          Assessment & Plan:  Type II or unspecified type diabetes mellitus without mention of complication, not stated as uncontrolled - Plan: COMPLETE METABOLIC PANEL WITH GFR, Hemoglobin A1c  Essential hypertension, benign - Plan: COMPLETE METABOLIC PANEL WITH GFR, Lipid panel  Other and unspecified hyperlipidemia - Plan: COMPLETE METABOLIC PANEL WITH GFR, Lipid panel  Blood pressures currently well controlled. Asked the patient to return fasting for a CMP, fasting lipid panel, hemoglobin A1c. Her goal LDL is less than 100. I like to keep her triglycerides less than  250 due to her fatty liver disease. I like to keep her hemoglobin A1c less than 6.5. If her hemoglobin A1c is elevated I would like to add invokana to try to assist the patient in weight loss. His hemoglobin A1c is within therapeutic range, I would discontinue Januvia and then add invokana.

## 2013-05-31 ENCOUNTER — Other Ambulatory Visit: Payer: Medicare Other

## 2013-05-31 DIAGNOSIS — I10 Essential (primary) hypertension: Secondary | ICD-10-CM

## 2013-05-31 DIAGNOSIS — E119 Type 2 diabetes mellitus without complications: Secondary | ICD-10-CM

## 2013-05-31 DIAGNOSIS — E785 Hyperlipidemia, unspecified: Secondary | ICD-10-CM

## 2013-05-31 LAB — LIPID PANEL
Cholesterol: 206 mg/dL — ABNORMAL HIGH (ref 0–200)
HDL: 40 mg/dL (ref >39)
LDL Cholesterol: 118 mg/dL — ABNORMAL HIGH (ref 0–99)
TRIGLYCERIDES: 239 mg/dL — AB (ref <150)
Total CHOL/HDL Ratio: 5.2 Ratio
VLDL: 48 mg/dL — AB (ref 0–40)

## 2013-05-31 LAB — COMPLETE METABOLIC PANEL WITH GFR
ALBUMIN: 4.3 g/dL (ref 3.5–5.2)
ALT: 92 U/L — AB (ref 0–35)
AST: 73 U/L — AB (ref 0–37)
Alkaline Phosphatase: 70 U/L (ref 39–117)
BUN: 14 mg/dL (ref 6–23)
CHLORIDE: 100 meq/L (ref 96–112)
CO2: 24 meq/L (ref 19–32)
CREATININE: 0.59 mg/dL (ref 0.50–1.10)
Calcium: 9.9 mg/dL (ref 8.4–10.5)
GFR, Est Non African American: >89 mL/min
Glucose, Bld: 104 mg/dL — ABNORMAL HIGH (ref 70–99)
POTASSIUM: 4.2 meq/L (ref 3.5–5.3)
SODIUM: 139 meq/L (ref 135–145)
Total Bilirubin: 0.4 mg/dL (ref 0.3–1.2)
Total Protein: 7.1 g/dL (ref 6.0–8.3)

## 2013-05-31 LAB — HEMOGLOBIN A1C
HEMOGLOBIN A1C: 6.8 % — AB (ref <5.7)
Mean Plasma Glucose: 148 mg/dL — ABNORMAL HIGH (ref <117)

## 2013-06-01 ENCOUNTER — Other Ambulatory Visit: Payer: Self-pay | Admitting: Family Medicine

## 2013-06-01 MED ORDER — METFORMIN HCL 1000 MG PO TABS: 1000.0000 mg | ORAL_TABLET | Freq: Two times a day (BID) | ORAL | Status: DC

## 2013-06-01 MED ORDER — SITAGLIPTIN PHOSPHATE 100 MG PO TABS: 100.0000 mg | ORAL_TABLET | Freq: Every day | ORAL | Status: DC

## 2013-06-04 ENCOUNTER — Telehealth: Payer: Self-pay | Admitting: Family Medicine

## 2013-06-04 MED ORDER — SITAGLIPTIN PHOSPHATE 100 MG PO TABS: 100.0000 mg | ORAL_TABLET | Freq: Every day | ORAL | Status: DC

## 2013-06-04 MED ORDER — METFORMIN HCL 1000 MG PO TABS: 1000.0000 mg | ORAL_TABLET | Freq: Two times a day (BID) | ORAL | Status: DC

## 2013-06-04 NOTE — Telephone Encounter (Signed)
Sent rx's to [facility] and pt aware

## 2013-06-04 NOTE — Telephone Encounter (Signed)
Message copied by Alyson Locket on Fri Jun 04, 2013 11:54 AM ------      Message from: Kristine Garbe      Created: Fri Jun 04, 2013 11:37 AM      Contact: [phone]       [facility] in [place] has no record of her new Metfromin and Januvia Rx.s- PLease call ------

## 2013-09-07 ENCOUNTER — Other Ambulatory Visit: Payer: Self-pay | Admitting: Family Medicine

## 2013-12-07 ENCOUNTER — Encounter: Payer: Medicare Other | Admitting: Family Medicine

## 2013-12-21 ENCOUNTER — Other Ambulatory Visit: Payer: Self-pay | Admitting: Family Medicine

## 2013-12-21 ENCOUNTER — Encounter: Payer: Self-pay | Admitting: Family Medicine

## 2013-12-21 ENCOUNTER — Ambulatory Visit (INDEPENDENT_AMBULATORY_CARE_PROVIDER_SITE_OTHER): Payer: Medicare Other | Admitting: Family Medicine

## 2013-12-21 VITALS — BP 118/80 | HR 70 | Temp 97.6°F | Resp 18 | Wt 191.5 lb

## 2013-12-21 DIAGNOSIS — Z1231 Encounter for screening mammogram for malignant neoplasm of breast: Secondary | ICD-10-CM

## 2013-12-21 DIAGNOSIS — R0600 Dyspnea, unspecified: Secondary | ICD-10-CM

## 2013-12-21 DIAGNOSIS — Z0000 Encounter for general adult medical examination without abnormal findings: Secondary | ICD-10-CM

## 2013-12-21 DIAGNOSIS — E119 Type 2 diabetes mellitus without complications: Secondary | ICD-10-CM

## 2013-12-21 DIAGNOSIS — R0989 Other specified symptoms and signs involving the circulatory and respiratory systems: Secondary | ICD-10-CM

## 2013-12-21 DIAGNOSIS — R0609 Other forms of dyspnea: Secondary | ICD-10-CM

## 2013-12-21 LAB — CBC WITH DIFFERENTIAL/PLATELET
BASOS ABS: 0 10*3/uL (ref 0.0–0.1)
Basophils Relative: 0 % (ref 0–1)
EOS ABS: 0.2 10*3/uL (ref 0.0–0.7)
EOS PCT: 2 % (ref 0–5)
HCT: 41 % (ref 36.0–46.0)
Hemoglobin: 13.9 g/dL (ref 12.0–15.0)
Lymphocytes Relative: 43 % (ref 12–46)
Lymphs Abs: 3.4 10*3/uL (ref 0.7–4.0)
MCH: 29.4 pg (ref 26.0–34.0)
MCHC: 33.9 g/dL (ref 30.0–36.0)
MCV: 86.7 fL (ref 78.0–100.0)
Monocytes Absolute: 0.6 10*3/uL (ref 0.1–1.0)
Monocytes Relative: 7 % (ref 3–12)
NEUTROS PCT: 48 % (ref 43–77)
Neutro Abs: 3.8 10*3/uL (ref 1.7–7.7)
Platelets: 343 10*3/uL (ref 150–400)
RBC: 4.73 MIL/uL (ref 3.87–5.11)
RDW: 13.3 % (ref 11.5–15.5)
WBC: 8 10*3/uL (ref 4.0–10.5)

## 2013-12-21 LAB — COMPLETE METABOLIC PANEL WITH GFR
ALT: 77 U/L — AB (ref 0–35)
AST: 53 U/L — ABNORMAL HIGH (ref 0–37)
Albumin: 4.8 g/dL (ref 3.5–5.2)
Alkaline Phosphatase: 64 U/L (ref 39–117)
BILIRUBIN TOTAL: 0.5 mg/dL (ref 0.2–1.2)
BUN: 18 mg/dL (ref 6–23)
CO2: 29 mEq/L (ref 19–32)
CREATININE: 0.87 mg/dL (ref 0.50–1.10)
Calcium: 9.9 mg/dL (ref 8.4–10.5)
Chloride: 97 mEq/L (ref 96–112)
GFR, Est African American: >89 mL/min
GFR, Est Non African American: 81 mL/min
Glucose, Bld: 109 mg/dL — ABNORMAL HIGH (ref 70–99)
Potassium: 4.3 mEq/L (ref 3.5–5.3)
SODIUM: 139 meq/L (ref 135–145)
TOTAL PROTEIN: 7.7 g/dL (ref 6.0–8.3)

## 2013-12-21 LAB — LIPID PANEL
Cholesterol: 202 mg/dL — ABNORMAL HIGH (ref 0–200)
HDL: 48 mg/dL (ref >39)
LDL CALC: 103 mg/dL — AB (ref 0–99)
TRIGLYCERIDES: 256 mg/dL — AB (ref <150)
Total CHOL/HDL Ratio: 4.2 Ratio
VLDL: 51 mg/dL — AB (ref 0–40)

## 2013-12-21 LAB — HEMOGLOBIN A1C
Hgb A1c MFr Bld: 6.5 % — ABNORMAL HIGH (ref <5.7)
Mean Plasma Glucose: 140 mg/dL — ABNORMAL HIGH (ref <117)

## 2013-12-21 LAB — MICROALBUMIN, URINE: Microalb, Ur: 0.5 mg/dL (ref 0.00–1.89)

## 2013-12-21 MED ORDER — BUPROPION HCL ER (XL) 150 MG PO TB24: 300.0000 mg | ORAL_TABLET | Freq: Every day | ORAL | Status: DC

## 2013-12-21 NOTE — Progress Notes (Signed)
Subjective:    Patient ID: Jacqueline [name], female    DOB: 1965/08/01, 48 y.o.   MRN: [id number]  HPI Patient is here today for a complete physical exam. Her past medical history significant for a hysterectomy. Therefore she does not require Pap smear. The patient had a colonoscopy performed in 2011 which was completely normal. Therefore she is not due for a colonoscopy. She has not had a mammogram since 2013. She is due for mammogram. She is also overdue for fasting lab work.  She sees an eye doctor every year due to her diabetes. She does take an aspirin 81 mg by mouth daily. She is overdue for diabetic foot exam. Otherwise she is doing well. She is requesting referral to cardiologist for baseline stress test due to her episodes of tachycardia and dyspnea on exertion. Past Medical History  Diagnosis Date  . Fatty liver   . IBS (irritable bowel syndrome)   . GERD (gastroesophageal reflux disease)   . Anemia   . Anxiety disorder   . Arthritis   . Asthma   . Chronic headaches   . Kidney stones   . Pneumonia   . Obesity   . Erosive esophagitis   . Abnormal transaminases   . Sinus tachycardia   . Cellulitis   . DM (diabetes mellitus)   . Diverticulitis   . Pseudotumor cerebri    Past Surgical History  Procedure Laterality Date  . Nasal septum surgery    . Ankle surgery    . Foot surgery    . Ovarian cysts      x3  . Tonsillectomy    . Tubal ligation    . Egd  06/09/2009  . Colonoscopy  06/19/2009  . Vaginal hysterectomy     Current Outpatient Prescriptions on File Prior to Visit  Medication Sig Dispense Refill  . albuterol (PROVENTIL HFA;VENTOLIN HFA) 108 (90 BASE) MCG/ACT inhaler Inhale 2 puffs into the lungs every 6 (six) hours as needed.      Marland Kitchen aspirin EC 81 MG tablet Take 81 mg by mouth daily.      Marland Kitchen atorvastatin (LIPITOR) 40 MG tablet TAKE ONE TABLET BY MOUTH DAILY  90 tablet  3  . Blood Glucose Monitoring Suppl (ONE TOUCH ULTRA SYSTEM KIT) W/DEVICE KIT 1 kit by Does not  apply route once.      . cetirizine (ZYRTEC) 10 MG tablet Take 10 mg by mouth daily.      Marland Kitchen EPINEPHrine (EPIPEN 2-PAK) 0.3 mg/0.3 mL DEVI Inject 0.3 mLs (0.3 mg total) into the muscle once.  6 Device  1  . esomeprazole (NEXIUM) 40 MG capsule Take 40 mg by mouth at bedtime.      . furosemide (LASIX) 20 MG tablet Take 20 mg by mouth daily.      Marland Kitchen gabapentin (NEURONTIN) 600 MG tablet Take 300-1,200 mg by mouth 3 (three) times daily as needed.       Marland Kitchen HYDROcodone-acetaminophen (NORCO/VICODIN) 5-325 MG per tablet Take 0.5 tablets by mouth at bedtime as needed (cough).  10 tablet  0  . HYDROcodone-homatropine (HYCODAN) 5-1.5 MG/5ML syrup Take 5 mLs by mouth every 8 (eight) hours as needed for cough.  120 mL  0  . levofloxacin (LEVAQUIN) 500 MG tablet Take 1 tablet (500 mg total) by mouth daily.  7 tablet  0  . metFORMIN (GLUCOPHAGE) 1000 MG tablet Take 1 tablet (1,000 mg total) by mouth 2 (two) times daily with a meal.  180 tablet  5  .  metoprolol succinate (TOPROL-XL) 50 MG 24 hr tablet Take 1 tablet (50 mg total) by mouth daily. Take with or immediately following a meal.  90 tablet  0  . Misc. Devices (HUGO ROLLING WALKER) MISC Pt is requesting a walker with wheels and a seat( any kind her insurance will cover is fine) Dx. 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 ONE TOUCH ULTRA TEST test strip USE TO TEST BLOOD SUGAR AS DIRECTED  100 each  6  . ONETOUCH DELICA LANCETS 01S MISC USE AS DIRECTED  200 each  3  . potassium chloride SA (K-DUR,KLOR-CON) 20 MEQ tablet 20 mEq. 2 tabs po qd      . sitaGLIPtin (JANUVIA) 100 MG tablet Take 1 tablet (100 mg total) by mouth daily.  30 tablet  5  . SUMAtriptan Succinate (IMITREX PO) Take 50-100 mg by mouth as needed (1/2 tablet to 1 tablet at onset of headache, may repeat in 2 hours).        No current facility-administered medications on file prior to visit.   Allergies  Allergen Reactions  . Naproxen Sodium      All pain meds, has fatty liver  . Reglan [Metoclopramide]    History   Social History  . Marital Status: Married    Spouse Name: N/A    Number of Children: 3  . Years of Education: N/A   Occupational History  . Disabled    Social History Main Topics  . Smoking status: Former Research scientist (life sciences)  . Smokeless tobacco: Not on file  . Alcohol Use: No  . Drug Use: No  . Sexual Activity: Not on file   Other Topics Concern  . Not on file   Social History Narrative  . No narrative on file   Family History  Problem Relation Age of Onset  . Colitis    . Crohn's disease    . Breast cancer    . Ovarian cancer    . Celiac disease    . Clotting disorder    . Cystic fibrosis      pat. cousin  . Diabetes Mother   . Heart disease    . Irritable bowel syndrome    . Kidney failure  31    mat. nephew  . Colon cancer Neg Hx   . Diabetes Maternal Aunt   . Diabetes Son   . Diabetes      mat. nephew      Review of Systems  All other systems reviewed and are negative.      Objective:   Physical Exam  Vitals reviewed. Constitutional: She is oriented to person, place, and time. She appears well-developed and well-nourished. No distress.  HENT:  Head: Normocephalic and atraumatic.  Right Ear: External ear normal.  Left Ear: External ear normal.  Nose: Nose normal.  Mouth/Throat: Oropharynx is clear and moist. No oropharyngeal exudate.  Eyes: Conjunctivae and EOM are normal. Pupils are equal, round, and reactive to light. Right eye exhibits no discharge. Left eye exhibits no discharge. No scleral icterus.  Neck: Normal range of motion. Neck supple. No JVD present. No thyromegaly present.  Cardiovascular: Normal rate, regular rhythm, normal heart sounds and intact distal pulses.  Exam reveals no gallop and no friction rub.   No murmur heard. Pulmonary/Chest: Effort normal and breath sounds normal. No stridor. No respiratory distress. She has no wheezes. She has no rales. She exhibits no  tenderness.  Abdominal: Soft. Bowel sounds are normal. She exhibits no distension and no mass. There is no tenderness. There is no rebound and no guarding.  Musculoskeletal: Normal range of motion. She exhibits no edema and no tenderness.  Lymphadenopathy:    She has no cervical adenopathy.  Neurological: She is alert and oriented to person, place, and time. She has normal reflexes. She displays normal reflexes. No cranial nerve deficit. She exhibits normal muscle tone. Coordination normal.  Skin: Skin is warm. No rash noted. She is not diaphoretic. No erythema. No pallor.  Psychiatric: She has a normal mood and affect. Her behavior is normal. Judgment and thought content normal.          Assessment & Plan:  Routine general medical examination at a health care facility - Plan: MM Digital Screening  Type II or unspecified type diabetes mellitus without mention of complication, not stated as uncontrolled - Plan: CBC with Differential, COMPLETE METABOLIC PANEL WITH GFR, Lipid panel, Hemoglobin A1c, Microalbumin, urine  Patient's physical exam is normal except for obesity. I recommended diet exercise and weight loss, particularly given her history of fatty liver disease.  Otherwise I will check a CBC, CMP, fasting lipid panel, hemoglobin A1c, and microalbumin. I will schedule the patient see a cardiologist for a stress test given her history of episodic tachycardia as well as dyspnea on exertion and her underlying diabetes, hypertension, and hyperlipidemia. Diabetic eye exam diabetic foot exam are up-to-date. Recommended cancer screening is up to date

## 2013-12-30 ENCOUNTER — Ambulatory Visit
Admission: RE | Admit: 2013-12-30 | Discharge: 2013-12-30 | Disposition: A | Discharge status: Home / Self Care | Payer: Medicare Other | Source: Ambulatory Visit | Attending: Family Medicine | Admitting: Family Medicine

## 2013-12-30 DIAGNOSIS — Z1231 Encounter for screening mammogram for malignant neoplasm of breast: Secondary | ICD-10-CM

## 2013-12-31 ENCOUNTER — Other Ambulatory Visit: Payer: Self-pay | Admitting: Family Medicine

## 2014-01-11 ENCOUNTER — Institutional Professional Consult (permissible substitution): Payer: Medicare Other | Admitting: Cardiovascular Disease

## 2014-01-18 ENCOUNTER — Ambulatory Visit (INDEPENDENT_AMBULATORY_CARE_PROVIDER_SITE_OTHER): Payer: Medicare Other | Admitting: Family Medicine

## 2014-01-18 ENCOUNTER — Encounter: Payer: Self-pay | Admitting: Family Medicine

## 2014-01-18 VITALS — BP 128/76 | HR 84 | Temp 97.6°F | Resp 20 | Ht 62.0 in | Wt 190.0 lb

## 2014-01-18 DIAGNOSIS — J019 Acute sinusitis, unspecified: Secondary | ICD-10-CM

## 2014-01-18 MED ORDER — AMOXICILLIN-POT CLAVULANATE 875-125 MG PO TABS: 1.0000 | ORAL_TABLET | Freq: Two times a day (BID) | ORAL | Status: DC

## 2014-01-18 MED ORDER — ESOMEPRAZOLE MAGNESIUM 40 MG PO CPDR: 40.0000 mg | DELAYED_RELEASE_CAPSULE | Freq: Two times a day (BID) | ORAL | Status: DC

## 2014-01-18 NOTE — Progress Notes (Signed)
Subjective:    Patient ID: Jacqueline [name], female    DOB: 1965/08/22, 48 y.o.   MRN: [id number]  HPI Patient has had right maxillary and right frontal sinus pain and pressure now for 7-10 days. She is also reporting subjective fevers and severe headaches. She rhinorrhea and postnasal drip. She denies any cough or sore throat. She denies any otalgia but both ears feel "full." Past Medical History  Diagnosis Date  . Fatty liver   . IBS (irritable bowel syndrome)   . GERD (gastroesophageal reflux disease)   . Anemia   . Anxiety disorder   . Arthritis   . Asthma   . Chronic headaches   . Kidney stones   . Pneumonia   . Obesity   . Erosive esophagitis   . Abnormal transaminases   . Sinus tachycardia   . Cellulitis   . DM (diabetes mellitus)   . Diverticulitis   . Pseudotumor cerebri    Current Outpatient Prescriptions on File Prior to Visit  Medication Sig Dispense Refill  . aspirin EC 81 MG tablet Take 81 mg by mouth daily.      Marland Kitchen atorvastatin (LIPITOR) 40 MG tablet TAKE ONE TABLET BY MOUTH DAILY  90 tablet  3  . Blood Glucose Monitoring Suppl (ONE TOUCH ULTRA SYSTEM KIT) W/DEVICE KIT 1 kit by Does not apply route once.      Marland Kitchen buPROPion (WELLBUTRIN XL) 150 MG 24 hr tablet Take 2 tablets (300 mg total) by mouth daily.  60 tablet  5  . cetirizine (ZYRTEC) 10 MG tablet Take 10 mg by mouth daily.      Marland Kitchen EPINEPHrine (EPIPEN 2-PAK) 0.3 mg/0.3 mL DEVI Inject 0.3 mLs (0.3 mg total) into the muscle once.  6 Device  1  . furosemide (LASIX) 20 MG tablet Take 20 mg by mouth daily.      Marland Kitchen gabapentin (NEURONTIN) 600 MG tablet Take 300-1,200 mg by mouth 3 (three) times daily as needed.       Marland Kitchen JANUVIA 100 MG tablet TAKE 1 TABLET BY MOUTH EVERY DAY  30 tablet  3  . metFORMIN (GLUCOPHAGE) 1000 MG tablet Take 1 tablet (1,000 mg total) by mouth 2 (two) times daily with a meal.  180 tablet  5  . metoprolol succinate (TOPROL-XL) 50 MG 24 hr tablet Take 1 tablet (50 mg total) by mouth daily. Take with  or immediately following a meal.  90 tablet  0  . Misc. Devices (HUGO ROLLING WALKER) MISC Pt is requesting a walker with wheels and a seat( any kind her insurance will cover is fine) Dx. 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 ONE TOUCH ULTRA TEST test strip USE TO TEST BLOOD SUGAR AS DIRECTED  100 each  11  . ONETOUCH DELICA LANCETS 85Y MISC USE AS DIRECTED  200 each  3  . potassium chloride SA (K-DUR,KLOR-CON) 20 MEQ tablet 20 mEq. 2 tabs po qd      . SUMAtriptan Succinate (IMITREX PO) Take 50-100 mg by mouth as needed (1/2 tablet to 1 tablet at onset of headache, may repeat in 2 hours).       Marland Kitchen albuterol (PROVENTIL HFA;VENTOLIN HFA) 108 (90 BASE) MCG/ACT inhaler Inhale 2 puffs into the lungs every 6 (six) hours as needed.       No current facility-administered medications on file prior to visit.   Allergies  Allergen Reactions  .  Naproxen Sodium     All pain meds, has fatty liver  . Reglan [Metoclopramide]    History   Social History  . Marital Status: Married    Spouse Name: N/A    Number of Children: 3  . Years of Education: N/A   Occupational History  . Disabled    Social History Main Topics  . Smoking status: Former Research scientist (life sciences)  . Smokeless tobacco: Not on file  . Alcohol Use: No  . Drug Use: No  . Sexual Activity: Not on file   Other Topics Concern  . Not on file   Social History Narrative  . No narrative on file     Review of Systems  All other systems reviewed and are negative.      Objective:   Physical Exam  Vitals reviewed. Constitutional: She appears well-developed and well-nourished.  HENT:  Right Ear: Tympanic membrane, external ear and ear canal normal.  Left Ear: Tympanic membrane, external ear and ear canal normal.  Nose: Mucosal edema and rhinorrhea present. Right sinus exhibits maxillary sinus tenderness and frontal sinus tenderness.  Mouth/Throat: Oropharynx is clear and moist. No oropharyngeal  exudate.  Neck: Neck supple.  Cardiovascular: Normal rate, regular rhythm and normal heart sounds.   Pulmonary/Chest: Effort normal and breath sounds normal. No respiratory distress. She has no wheezes. She has no rales.  Lymphadenopathy:    She has no cervical adenopathy.          Assessment & Plan:  Acute rhinosinusitis - Plan: amoxicillin-clavulanate (AUGMENTIN) 875-125 MG per tablet  begin Augmentin 875 mg by mouth twice a day for 10 days. Take Sudafed as needed for congestion. Followup in one week if no better sooner if worse.

## 2014-01-26 ENCOUNTER — Other Ambulatory Visit: Payer: Self-pay | Admitting: Family Medicine

## 2014-01-31 ENCOUNTER — Ambulatory Visit (INDEPENDENT_AMBULATORY_CARE_PROVIDER_SITE_OTHER): Payer: Medicare Other | Admitting: Cardiology

## 2014-01-31 ENCOUNTER — Encounter: Payer: Self-pay | Admitting: Cardiology

## 2014-01-31 VITALS — BP 122/80 | HR 86 | Ht 62.5 in | Wt 189.1 lb

## 2014-01-31 DIAGNOSIS — E1142 Type 2 diabetes mellitus with diabetic polyneuropathy: Secondary | ICD-10-CM

## 2014-01-31 DIAGNOSIS — G932 Benign intracranial hypertension: Secondary | ICD-10-CM

## 2014-01-31 DIAGNOSIS — I10 Essential (primary) hypertension: Secondary | ICD-10-CM

## 2014-01-31 DIAGNOSIS — E1169 Type 2 diabetes mellitus with other specified complication: Secondary | ICD-10-CM

## 2014-01-31 DIAGNOSIS — E1349 Other specified diabetes mellitus with other diabetic neurological complication: Secondary | ICD-10-CM

## 2014-01-31 DIAGNOSIS — E0840 Diabetes mellitus due to underlying condition with diabetic neuropathy, unspecified: Secondary | ICD-10-CM | POA: Insufficient documentation

## 2014-01-31 DIAGNOSIS — R079 Chest pain, unspecified: Secondary | ICD-10-CM

## 2014-01-31 DIAGNOSIS — E785 Hyperlipidemia, unspecified: Secondary | ICD-10-CM

## 2014-01-31 DIAGNOSIS — E1165 Type 2 diabetes mellitus with hyperglycemia: Secondary | ICD-10-CM

## 2014-01-31 DIAGNOSIS — E0842 Diabetes mellitus due to underlying condition with diabetic polyneuropathy: Secondary | ICD-10-CM

## 2014-01-31 DIAGNOSIS — IMO0001 Reserved for inherently not codable concepts without codable children: Secondary | ICD-10-CM

## 2014-01-31 NOTE — Patient Instructions (Signed)
Your physician has requested that you have a lexiscan myoview. For further information please visit [email or web address]. Please follow instruction sheet, as given.  YOU NEED TO AVOID CAFFEINE  Follow up as needed

## 2014-01-31 NOTE — Progress Notes (Signed)
Jacqueline [name] Date of Birth:  [birth year] [name] [address] [place], [place]  [address] [phone]        Fax   [phone]   History of Present Illness: This pleasant 48 year old woman is seen by me for the first time today.  She is seen at the request of Dr. Dennard Schaumann.  She is seen for evaluation of left-sided chest pain.  She does not have any history of known heart disease.  She does not have any history of high blood pressure.  She is a remote smoker.  She is also exposed to a lot of secondhand smoke from her husband.  She does have a history of high cholesterol and she is a type II diabetic.  She has a past history of tachycardia responding to beta blockers.  She has been having intermittent left-sided chest discomfort.  The discomfort is not related to exertion or activity.  She also has left arm discomfort but thinks that that may be related to a pinched nerve in her neck.  The patient is diabetic and has diabetic neuropathy making it difficult for her to walk for exercise.  She is also had 2 prior surgical procedures on her left Achilles tendon area.  She is unable to walk on a treadmill test. She has a past history of fatty liver.  She states that she has been checked for hemachromatosis and is negative.  The patient is on disability for chronic migraine headaches which are severe.  She has had migraines since age 69.  The patient has a past history of asthma but has had no recent problems Her family history reveals that her mother is alive and is diabetic and has high blood pressure.  Her father is alive and has had 2 prior  heart attacks.  Current Outpatient Prescriptions  Medication Sig Dispense Refill  . albuterol (PROVENTIL HFA;VENTOLIN HFA) 108 (90 BASE) MCG/ACT inhaler Inhale 2 puffs into the lungs every 6 (six) hours as needed.      Marland Kitchen aspirin EC 81 MG tablet Take 81 mg by mouth daily.      Marland Kitchen atorvastatin (LIPITOR) 40 MG tablet TAKE 1 TABLET BY MOUTH  EVERY DAY  90 tablet  1  . Blood Glucose Monitoring Suppl (ONE TOUCH ULTRA SYSTEM KIT) W/DEVICE KIT 1 kit by Does not apply route once.      Marland Kitchen buPROPion (WELLBUTRIN XL) 150 MG 24 hr tablet Take 2 tablets (300 mg total) by mouth daily.  60 tablet  5  . cetirizine (ZYRTEC) 10 MG tablet Take 10 mg by mouth daily.      . Cinnamon 500 MG capsule Take 1,000 mg by mouth daily.      Marland Kitchen EPINEPHrine 0.3 mg/0.3 mL IJ SOAJ injection Inject 0.3 mg into the muscle once. Bee stings      . esomeprazole (NEXIUM) 40 MG capsule Take 1 capsule (40 mg total) by mouth 2 (two) times daily before a meal.  60 capsule  3  . furosemide (LASIX) 20 MG tablet Take 20 mg by mouth daily.      Marland Kitchen gabapentin (NEURONTIN) 600 MG tablet Take 300-1,200 mg by mouth 3 (three) times daily as needed.       Marland Kitchen JANUVIA 100 MG tablet TAKE 1 TABLET BY MOUTH EVERY DAY  30 tablet  3  . metFORMIN (GLUCOPHAGE) 1000 MG tablet Take 1 tablet (1,000 mg total) by mouth 2 (two) times daily with a meal.  180  tablet  5  . metoprolol succinate (TOPROL-XL) 50 MG 24 hr tablet Take 1 tablet (50 mg total) by mouth daily. Take with or immediately following a meal.  90 tablet  0  . Misc. Devices (HUGO ROLLING WALKER) MISC Pt is requesting a walker with wheels and a seat( any kind her insurance will cover is fine) Dx. 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 ONE TOUCH ULTRA TEST test strip USE TO TEST BLOOD SUGAR AS DIRECTED  100 each  11  . ONETOUCH DELICA LANCETS 30S MISC USE AS DIRECTED  200 each  3  . potassium chloride SA (K-DUR,KLOR-CON) 20 MEQ tablet 20 mEq. 2 tabs po qd      . SUMAtriptan Succinate (IMITREX PO) Take 50-100 mg by mouth as needed (1/2 tablet to 1 tablet at onset of headache, may repeat in 2 hours).        No current facility-administered medications for this visit.    Allergies  Allergen Reactions  . Bee Pollen Anaphylaxis    BEE STINGS  . Naproxen Sodium Other (See Comments)    All pain  meds, has fatty liver  . Reglan [Metoclopramide] Hives    Patient Active Problem List   Diagnosis Date Noted  . Chest pain 01/31/2014  . Dyslipidemia associated with type 2 diabetes mellitus 01/31/2014  . Diabetic neuropathy associated with diabetes mellitus due to underlying condition 01/31/2014  . Pseudotumor cerebri 01/31/2014  . Essential hypertension, benign 08/05/2012  . Other and unspecified hyperlipidemia 08/05/2012  . Obesity (BMI 30.0-34.9) 11/18/2011  . Diabetes mellitus type II 11/18/2011  . Chronic migraine 11/18/2011  . GERD 05/29/2009  . IRRITABLE BOWEL SYNDROME 05/29/2009  . FATTY LIVER DISEASE 05/29/2009    History  Smoking status  . Former Smoker  Smokeless tobacco  . Not on file    History  Alcohol Use No    Family History  Problem Relation Age of Onset  . Colitis    . Crohn's disease    . Breast cancer    . Ovarian cancer    . Celiac disease    . Clotting disorder    . Cystic fibrosis      pat. cousin  . Diabetes Mother   . Heart disease    . Irritable bowel syndrome    . Kidney failure  31    mat. nephew  . Colon cancer Neg Hx   . Diabetes Maternal Aunt   . Diabetes Son   . Diabetes      mat. nephew    Review of Systems: Constitutional: no fever chills diaphoresis or fatigue or change in weight.  Head and neck: no hearing loss, no epistaxis, no photophobia or visual disturbance. Respiratory: No cough, shortness of breath or wheezing. Cardiovascular: Positive for left-sided chest discomfort Gastrointestinal: No abdominal distention, no abdominal pain, no change in bowel habits hematochezia or melena. Genitourinary: No dysuria, no frequency, no urgency, no nocturia. Musculoskeletal:No arthralgias, no back pain, no gait disturbance or myalgias. Neurological: No dizziness, no headaches, no numbness, no seizures, no syncope, no weakness, no tremors. Hematologic: No lymphadenopathy, no easy bruising. Psychiatric: No confusion, no  hallucinations, no sleep disturbance.    Physical Exam: Filed Vitals:   01/31/14 1340  BP: 122/80  Pulse: 86  The patient appears to be in no distress.  Head and neck exam reveals that the pupils are equal and reactive.  The extraocular movements  are full.  There is no scleral icterus.  Mouth and pharynx are benign.  No lymphadenopathy.  No carotid bruits.  The jugular venous pressure is normal.  Thyroid is not enlarged or tender.  Chest is clear to percussion and auscultation.  No rales or rhonchi.  Expansion of the chest is symmetrical.  Heart reveals no abnormal lift or heave.  First and second heart sounds are normal.  There is no murmur gallop rub or click.  The abdomen is soft and nontender.  Bowel sounds are normoactive.  There is no hepatosplenomegaly or mass.  There are no abdominal bruits.  Extremities reveal no phlebitis or edema.  Pedal pulses are good.  There is no cyanosis or clubbing.  Neurologic exam is normal strength and no lateralizing weakness.  No sensory deficits.  Integument reveals no rash  EKG today shows normal sinus rhythm and is within normal limits.  Assessment / Plan: 1.  Atypical left-sided chest discomfort 2. multiple risk factors for coronary disease including diabetes and hypercholesterolemia and prior smoking history. 3. Incapacitating migraine headaches, on disability 4. past history of paroxysmal tachycardia, controlled on beta blocker.  Plan: We will have her return for a LexiScan Myoview stress test.  She is unable to walk on a treadmill. Many thanks for the opportunity to see this pleasant woman with you.  I will be in touch regarding the results of her stress test.

## 2014-02-11 ENCOUNTER — Ambulatory Visit (HOSPITAL_COMMUNITY): Payer: Medicare Other | Attending: Cardiology | Admitting: Radiology

## 2014-02-11 VITALS — BP 142/80 | Ht 62.5 in | Wt 188.0 lb

## 2014-02-11 DIAGNOSIS — I10 Essential (primary) hypertension: Secondary | ICD-10-CM | POA: Insufficient documentation

## 2014-02-11 DIAGNOSIS — E1165 Type 2 diabetes mellitus with hyperglycemia: Secondary | ICD-10-CM

## 2014-02-11 DIAGNOSIS — R079 Chest pain, unspecified: Secondary | ICD-10-CM | POA: Diagnosis present

## 2014-02-11 DIAGNOSIS — E119 Type 2 diabetes mellitus without complications: Secondary | ICD-10-CM | POA: Diagnosis not present

## 2014-02-11 MED ORDER — REGADENOSON 0.4 MG/5ML IV SOLN
0.4000 mg | Freq: Once | INTRAVENOUS | Status: AC
  Administered 2014-02-11: 0.4 mg via INTRAVENOUS

## 2014-02-11 MED ORDER — TECHNETIUM TC 99M SESTAMIBI GENERIC - CARDIOLITE
11.0000 | Freq: Once | INTRAVENOUS | Status: AC | PRN
  Administered 2014-02-11: 11 via INTRAVENOUS

## 2014-02-11 MED ORDER — TECHNETIUM TC 99M SESTAMIBI GENERIC - CARDIOLITE
33.0000 | Freq: Once | INTRAVENOUS | Status: AC | PRN
  Administered 2014-02-11: 33 via INTRAVENOUS

## 2014-02-11 NOTE — Progress Notes (Signed)
[facility] [address] [place], [place] [address] [phone]    Cardiology Nuclear Med Study  Jacqueline [name] is a 48 y.o. female     MRN : [id number]     DOB: 1965-12-17  Procedure Date: 02/11/2014  Nuclear Med Background Indication for Stress Test:  Evaluation for Ischemia History:  Hx Tachycardia Cardiac Risk Factors: Hypertension and NIDDM  Symptoms:  Chest Pain   Nuclear Pre-Procedure Caffeine/Decaff Intake:  None NPO After: 7:00pm   Lungs:  clear O2 Sat: 97% on room air. IV 0.9% NS with Angio Cath:  22g  IV Site: L Antecubital  IV Started by:  Crissie Figures, RN  Chest Size (in):  38 Cup Size: DD  Height: 5' 2.5" (1.588 m)  Weight:  188 lb (85.276 kg)  BMI:  Body mass index is 33.82 kg/(m^2). Tech Comments:  N/A    Nuclear Med Study 1 or 2 day study: 1 day  Stress Test Type:  Lexiscan  Reading MD: N/A  Order Authorizing Provider:  Darlin Coco, MD  Resting Radionuclide: Technetium 84mSestamibi  Resting Radionuclide Dose: 11.0 mCi   Stress Radionuclide:  Technetium 938mestamibi  Stress Radionuclide Dose: 33.0 mCi           Stress Protocol Rest HR: 92 Stress HR: 137  Rest BP: 142/80 Stress BP: 181/84  Exercise Time (min): n/a METS: n/a   Predicted Max HR: 175 bpm % Max HR: 78.29 bpm Rate Pressure Product: 24797   Dose of Adenosine (mg):  n/a Dose of Lexiscan: 0.4 mg  Dose of Atropine (mg): n/a Dose of Dobutamine: n/a mcg/kg/min (at max HR)  Stress Test Technologist: JaCrissie FiguresRN  Nuclear Technologist:  ElEarl ManyCNMT     Rest Procedure:  Myocardial perfusion imaging was performed at rest 45 minutes following the intravenous administration of Technetium 9935mstamibi. Rest ECG: NSR - Normal EKG  Stress Procedure:  The patient received IV Lexiscan 0.4 mg over 15-seconds.  Technetium 49m52mtamibi injected at 30-seconds.  Quantitative spect images were obtained after a 45 minute delay. Stress ECG: No significant  change from baseline ECG  QPS Raw Data Images:  Soft tissue (bowel activity) underlies heart.   Stress Images: Normal perfusion with minimal apical thinning.   Rest Images:  Comparison with the stress images reveals no significant change. Subtraction (SDS):  No evidence of ischemia. Transient Ischemic Dilatation (Normal <1.22):  1.03 Lung/Heart Ratio (Normal <0.45):  0.56  Quantitative Gated Spect Images QGS EDV:  61 ml QGS ESV:  23 ml  Impression Exercise Capacity:  Lexiscan with no exercise. BP Response:  Normal blood pressure response. Clinical Symptoms:  No significant symptoms noted. ECG Impression:  No significant ST segment change suggestive of ischemia. Comparison with Prior Nuclear Study: No images to compare  Overall Impression:  Normal stress nuclear study.  LV Ejection Fraction: 62%.  LV Wall Motion:  NL LV Function; NL Wall Motion

## 2014-04-13 ENCOUNTER — Other Ambulatory Visit: Payer: Self-pay | Admitting: Family Medicine

## 2014-05-13 ENCOUNTER — Emergency Department (HOSPITAL_COMMUNITY)
Admission: EM | Admit: 2014-05-13 | Discharge: 2014-05-13 | Disposition: A | Discharge status: Home / Self Care | Payer: Medicare Other | Source: Home / Self Care | Attending: Family Medicine | Admitting: Family Medicine

## 2014-05-13 ENCOUNTER — Encounter (HOSPITAL_COMMUNITY): Payer: Self-pay | Admitting: *Deleted

## 2014-05-13 DIAGNOSIS — J208 Acute bronchitis due to other specified organisms: Secondary | ICD-10-CM

## 2014-05-13 DIAGNOSIS — J209 Acute bronchitis, unspecified: Secondary | ICD-10-CM

## 2014-05-13 MED ORDER — IPRATROPIUM BROMIDE 0.06 % NA SOLN: 2.0000 | Freq: Four times a day (QID) | NASAL | Status: DC

## 2014-05-13 MED ORDER — MINOCYCLINE HCL 100 MG PO CAPS: 100.0000 mg | ORAL_CAPSULE | Freq: Two times a day (BID) | ORAL | Status: DC

## 2014-05-13 NOTE — ED Provider Notes (Signed)
CSN: [id number]     Arrival date & time 05/13/14  1148 History   First MD Initiated Contact with Patient 05/13/14 1155     Chief Complaint  Patient presents with  . URI   (Consider location/radiation/quality/duration/timing/severity/associated sxs/prior Treatment) Patient is a 49 y.o. female presenting with URI. The history is provided by the patient.  URI Presenting symptoms: congestion, cough and rhinorrhea   Presenting symptoms: no fever and no sore throat   Severity:  Mild Onset quality:  Gradual Progression:  Worsening Chronicity:  New Relieved by:  None tried Worsened by:  Nothing tried Ineffective treatments:  OTC medications Associated symptoms: no wheezing   Risk factors: sick contacts     Past Medical History  Diagnosis Date  . Fatty liver   . IBS (irritable bowel syndrome)   . GERD (gastroesophageal reflux disease)   . Anemia   . Anxiety disorder   . Arthritis   . Asthma   . Chronic headaches   . Kidney stones   . Pneumonia   . Obesity   . Erosive esophagitis   . Abnormal transaminases   . Sinus tachycardia   . Cellulitis   . DM (diabetes mellitus)   . Diverticulitis   . Pseudotumor cerebri    Past Surgical History  Procedure Laterality Date  . Nasal septum surgery    . Ankle surgery    . Foot surgery    . Ovarian cysts      x3  . Tonsillectomy    . Tubal ligation    . Egd  06/09/2009  . Colonoscopy  06/19/2009  . Vaginal hysterectomy     Family History  Problem Relation Age of Onset  . Colitis    . Crohn's disease    . Breast cancer    . Ovarian cancer    . Celiac disease    . Clotting disorder    . Cystic fibrosis      pat. cousin  . Diabetes Mother   . Heart disease    . Irritable bowel syndrome    . Kidney failure  31    mat. nephew  . Colon cancer Neg Hx   . Diabetes Maternal Aunt   . Diabetes Son   . Diabetes      mat. nephew   History  Substance Use Topics  . Smoking status: Former Research scientist (life sciences)  . Smokeless tobacco: Not on file   . Alcohol Use: No   OB History    No data available     Review of Systems  Constitutional: Negative.  Negative for fever.  HENT: Positive for congestion, postnasal drip and rhinorrhea. Negative for sore throat.   Respiratory: Positive for cough. Negative for shortness of breath and wheezing.     Allergies  Bee pollen; Naproxen sodium; and Reglan  Home Medications   Prior to Admission medications   Medication Sig Start Date End Date Taking? Authorizing Provider  albuterol (PROVENTIL HFA;VENTOLIN HFA) 108 (90 BASE) MCG/ACT inhaler Inhale 2 puffs into the lungs every 6 (six) hours as needed.    Historical Provider, MD  aspirin EC 81 MG tablet Take 81 mg by mouth daily.    Historical Provider, MD  atorvastatin (LIPITOR) 40 MG tablet TAKE 1 TABLET BY MOUTH EVERY DAY 01/26/14   Susy Frizzle, MD  Blood Glucose Monitoring Suppl (ONE TOUCH ULTRA SYSTEM KIT) W/DEVICE KIT 1 kit by Does not apply route once.    Historical Provider, MD  buPROPion (WELLBUTRIN XL) 150 MG 24  hr tablet Take 2 tablets (300 mg total) by mouth daily. 12/21/13   Susy Frizzle, MD  cetirizine (ZYRTEC) 10 MG tablet Take 10 mg by mouth daily.    Historical Provider, MD  Cinnamon 500 MG capsule Take 1,000 mg by mouth daily.    Historical Provider, MD  EPINEPHrine 0.3 mg/0.3 mL IJ SOAJ injection Inject 0.3 mg into the muscle once. Bee stings 09/15/12   Susy Frizzle, MD  esomeprazole (NEXIUM) 40 MG capsule Take 1 capsule (40 mg total) by mouth 2 (two) times daily before a meal. 01/18/14   Susy Frizzle, MD  furosemide (LASIX) 20 MG tablet Take 20 mg by mouth daily.    Historical Provider, MD  gabapentin (NEURONTIN) 600 MG tablet Take 300-1,200 mg by mouth 3 (three) times daily as needed.     Historical Provider, MD  ipratropium (ATROVENT) 0.06 % nasal spray Place 2 sprays into both nostrils 4 (four) times daily. 05/13/14   Billy Fischer, MD  JANUVIA 100 MG tablet TAKE 1 TABLET BY MOUTH EVERY DAY 12/31/13   Susy Frizzle, MD  metFORMIN (GLUCOPHAGE) 1000 MG tablet Take 1 tablet (1,000 mg total) by mouth 2 (two) times daily with a meal. 06/04/13   Susy Frizzle, MD  metoprolol succinate (TOPROL-XL) 50 MG 24 hr tablet TAKE 1 TABLET BY MOUTH DAILY. TAKE WITH OR IMMEDIATELY FOLLOWING A MEAL 04/13/14   Susy Frizzle, MD  minocycline (MINOCIN,DYNACIN) 100 MG capsule Take 1 capsule (100 mg total) by mouth 2 (two) times daily. 05/13/14   Billy Fischer, MD  Misc. Devices ([place]) MISC Pt is requesting a walker with wheels and a seat( any kind her insurance will cover is fine) Dx. 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 2 tabs po qd    Historical Provider, MD  SUMAtriptan Succinate (IMITREX PO) Take 50-100 mg by mouth as needed (1/2 tablet to 1 tablet at onset of headache, may repeat in 2 hours).     Historical Provider, MD   BP 100/66 mmHg  Pulse 87  Temp(Src) 97.9 F (36.6 C) (Oral)  Resp 16  SpO2 96% Physical Exam  Constitutional: She is oriented to person, place, and time. She appears well-developed and well-nourished.  HENT:  Right Ear: External ear normal.  Left Ear: External ear normal.  Mouth/Throat: Oropharynx is clear and moist.  Eyes: Conjunctivae are normal. Pupils are equal, round, and reactive to light.  Neck: Normal range of motion. Neck supple.  Cardiovascular: Normal rate, regular rhythm and normal heart sounds.   Pulmonary/Chest: Effort normal. She has decreased breath sounds. She has rhonchi. She has no rales.  Lymphadenopathy:    She has no cervical adenopathy.  Neurological: She is alert and oriented to person, place, and time.  Skin: Skin is warm and dry.  Nursing  note and vitals reviewed.   ED Course  Procedures (including critical care time) Labs Review Labs Reviewed - No data to display  Imaging Review No results found.   MDM   1. Acute bronchitis due to infection        Billy Fischer,  MD 05/13/14 1214

## 2014-05-13 NOTE — ED Notes (Signed)
Pt          Reports          Symptoms              Of   Congested     Cough   Uri       X   2  Weeks         With    Symptoms     Not  releived  By  OTC  meds

## 2014-05-13 NOTE — Discharge Instructions (Signed)
Take all of medicine, drink lots of fluids,, see your doctor if further problems

## 2014-05-31 ENCOUNTER — Telehealth: Payer: Self-pay | Admitting: Family Medicine

## 2014-05-31 MED ORDER — SITAGLIPTIN PHOSPHATE 100 MG PO TABS: 100.0000 mg | ORAL_TABLET | Freq: Every day | ORAL | Status: DC

## 2014-05-31 NOTE — Telephone Encounter (Signed)
Patient requesting refill on januvia  [facility]

## 2014-05-31 NOTE — Telephone Encounter (Signed)
Prescription sent to pharmacy.

## 2014-06-15 DIAGNOSIS — F418 Other specified anxiety disorders: Secondary | ICD-10-CM | POA: Diagnosis not present

## 2014-06-15 DIAGNOSIS — G43101 Migraine with aura, not intractable, with status migrainosus: Secondary | ICD-10-CM | POA: Diagnosis not present

## 2014-06-15 DIAGNOSIS — G932 Benign intracranial hypertension: Secondary | ICD-10-CM | POA: Diagnosis not present

## 2014-06-15 DIAGNOSIS — M47812 Spondylosis without myelopathy or radiculopathy, cervical region: Secondary | ICD-10-CM | POA: Diagnosis not present

## 2014-06-16 ENCOUNTER — Telehealth: Payer: Self-pay | Admitting: Family Medicine

## 2014-06-16 MED ORDER — GLUCOSE BLOOD VI STRP: ORAL_STRIP | Status: DC

## 2014-06-16 MED ORDER — METFORMIN HCL 1000 MG PO TABS: 1000.0000 mg | ORAL_TABLET | Freq: Two times a day (BID) | ORAL | Status: DC

## 2014-06-16 MED ORDER — ONETOUCH DELICA LANCETS 33G MISC: Status: DC

## 2014-06-16 NOTE — Telephone Encounter (Signed)
Should be lancets not lantus and med, strips and lancets sent to pharm requested

## 2014-06-16 NOTE — Telephone Encounter (Signed)
Patient usually goes to [facility], but would like for [facility] to call [facility] [address] instead for her metformin, which she is out of, lantus, and test strips  [phone]

## 2014-06-29 ENCOUNTER — Other Ambulatory Visit: Payer: Self-pay | Admitting: Family Medicine

## 2014-06-29 NOTE — Telephone Encounter (Signed)
PT is needing a refill on atorvastatin (LIPITOR) 40 MG tablet and buPROPion (WELLBUTRIN XL) 150 MG 24 hr tablet She doesn't exactly need refill yets but she is switching [facility] [phone]

## 2014-06-30 MED ORDER — BUPROPION HCL ER (XL) 150 MG PO TB24: 300.0000 mg | ORAL_TABLET | Freq: Every day | ORAL | Status: DC

## 2014-06-30 MED ORDER — ATORVASTATIN CALCIUM 40 MG PO TABS: 40.0000 mg | ORAL_TABLET | Freq: Every day | ORAL | Status: DC

## 2014-06-30 NOTE — Telephone Encounter (Signed)
Received call from patient, but due to emergency, had to cut call short.   Attempted to return call to patient.   [place].   Prescriptions sent to pharmacy.

## 2014-09-06 ENCOUNTER — Other Ambulatory Visit: Payer: Self-pay | Admitting: Family Medicine

## 2014-09-06 ENCOUNTER — Encounter: Payer: Self-pay | Admitting: Family Medicine

## 2014-09-06 NOTE — Telephone Encounter (Signed)
Lancets refilled

## 2014-09-12 ENCOUNTER — Encounter: Payer: Self-pay | Admitting: Internal Medicine

## 2014-09-19 ENCOUNTER — Other Ambulatory Visit: Payer: Self-pay | Admitting: Family Medicine

## 2014-09-20 DIAGNOSIS — G932 Benign intracranial hypertension: Secondary | ICD-10-CM | POA: Diagnosis not present

## 2014-09-20 DIAGNOSIS — H2513 Age-related nuclear cataract, bilateral: Secondary | ICD-10-CM | POA: Diagnosis not present

## 2014-09-20 DIAGNOSIS — I10 Essential (primary) hypertension: Secondary | ICD-10-CM | POA: Diagnosis not present

## 2014-09-20 DIAGNOSIS — E119 Type 2 diabetes mellitus without complications: Secondary | ICD-10-CM | POA: Diagnosis not present

## 2014-09-20 LAB — HM DIABETES EYE EXAM

## 2014-10-24 ENCOUNTER — Encounter: Payer: Self-pay | Admitting: Family Medicine

## 2014-10-24 ENCOUNTER — Ambulatory Visit (INDEPENDENT_AMBULATORY_CARE_PROVIDER_SITE_OTHER): Payer: Medicare Other | Admitting: Family Medicine

## 2014-10-24 VITALS — BP 130/80 | HR 78 | Temp 98.2°F | Resp 18 | Ht 62.0 in | Wt 181.0 lb

## 2014-10-24 DIAGNOSIS — E119 Type 2 diabetes mellitus without complications: Secondary | ICD-10-CM | POA: Diagnosis not present

## 2014-10-24 DIAGNOSIS — Z23 Encounter for immunization: Secondary | ICD-10-CM

## 2014-10-24 LAB — CBC WITH DIFFERENTIAL/PLATELET
BASOS PCT: 0 % (ref 0–1)
Basophils Absolute: 0 10*3/uL (ref 0.0–0.1)
EOS ABS: 0.2 10*3/uL (ref 0.0–0.7)
EOS PCT: 2 % (ref 0–5)
HEMATOCRIT: 40.8 % (ref 36.0–46.0)
Hemoglobin: 13.5 g/dL (ref 12.0–15.0)
Lymphocytes Relative: 44 % (ref 12–46)
Lymphs Abs: 3.3 10*3/uL (ref 0.7–4.0)
MCH: 29.5 pg (ref 26.0–34.0)
MCHC: 33.1 g/dL (ref 30.0–36.0)
MCV: 89.3 fL (ref 78.0–100.0)
MONOS PCT: 6 % (ref 3–12)
MPV: 9.9 fL (ref 8.6–12.4)
Monocytes Absolute: 0.5 10*3/uL (ref 0.1–1.0)
NEUTROS PCT: 48 % (ref 43–77)
Neutro Abs: 3.6 10*3/uL (ref 1.7–7.7)
Platelets: 317 10*3/uL (ref 150–400)
RBC: 4.57 MIL/uL (ref 3.87–5.11)
RDW: 14.1 % (ref 11.5–15.5)
WBC: 7.6 10*3/uL (ref 4.0–10.5)

## 2014-10-24 LAB — COMPLETE METABOLIC PANEL WITH GFR
ALBUMIN: 4.6 g/dL (ref 3.5–5.2)
ALT: 35 U/L (ref 0–35)
AST: 27 U/L (ref 0–37)
Alkaline Phosphatase: 56 U/L (ref 39–117)
BILIRUBIN TOTAL: 0.3 mg/dL (ref 0.2–1.2)
BUN: 11 mg/dL (ref 6–23)
CO2: 25 meq/L (ref 19–32)
CREATININE: 0.76 mg/dL (ref 0.50–1.10)
Calcium: 9.7 mg/dL (ref 8.4–10.5)
Chloride: 101 mEq/L (ref 96–112)
GFR, Est African American: >89 mL/min
GFR, Est Non African American: >89 mL/min
GLUCOSE: 100 mg/dL — AB (ref 70–99)
Potassium: 4.3 mEq/L (ref 3.5–5.3)
SODIUM: 140 meq/L (ref 135–145)
Total Protein: 7.3 g/dL (ref 6.0–8.3)

## 2014-10-24 LAB — LIPID PANEL
CHOL/HDL RATIO: 3.7 ratio
CHOLESTEROL: 166 mg/dL (ref 0–200)
HDL: 45 mg/dL — ABNORMAL LOW (ref >=46)
LDL Cholesterol: 87 mg/dL (ref 0–99)
Triglycerides: 168 mg/dL — ABNORMAL HIGH (ref <150)
VLDL: 34 mg/dL (ref 0–40)

## 2014-10-24 NOTE — Addendum Note (Signed)
Addended by: Shary Decamp B on: 10/24/2014 08:50 AM   Modules accepted: Orders

## 2014-10-24 NOTE — Progress Notes (Signed)
Subjective:    Patient ID: Jacqueline [name], female    DOB: 03-Nov-1965, 49 y.o.   MRN: [id number]  HPI Patient is here for diabetes follow up.  Last HgA1c was 6.2 in 1/16  she denies polyuria, polydipsia, or blurred vision. All of her blood sugars are below 150. She denies any hypoglycemia. Diabetic eye exam was performed 3 months ago and was normal. Her blood pressure is excellent. She denies any chest pain shortness of breath or dyspnea on exertion. She is compliant in taking an aspirin every day.  She is seeing a neurologist for pseudotumor cerebri and is taking Lasix and potassium to help manage this. Past Medical History  Diagnosis Date  . Fatty liver   . IBS (irritable bowel syndrome)   . GERD (gastroesophageal reflux disease)   . Anemia   . Anxiety disorder   . Arthritis   . Asthma   . Chronic headaches   . Kidney stones   . Pneumonia   . Obesity   . Erosive esophagitis   . Abnormal transaminases   . Sinus tachycardia   . Cellulitis   . DM (diabetes mellitus)   . Diverticulitis   . Pseudotumor cerebri    Past Surgical History  Procedure Laterality Date  . Nasal septum surgery    . Ankle surgery    . Foot surgery    . Ovarian cysts      x3  . Tonsillectomy    . Tubal ligation    . Egd  06/09/2009  . Colonoscopy  06/19/2009  . Vaginal hysterectomy     Current Outpatient Prescriptions on File Prior to Visit  Medication Sig Dispense Refill  . albuterol (PROVENTIL HFA;VENTOLIN HFA) 108 (90 BASE) MCG/ACT inhaler Inhale 2 puffs into the lungs every 6 (six) hours as needed.    Marland Kitchen aspirin EC 81 MG tablet Take 81 mg by mouth daily.    Marland Kitchen atorvastatin (LIPITOR) 40 MG tablet Take 1 tablet (40 mg total) by mouth daily. 30 tablet 3  . Blood Glucose Monitoring Suppl (ONE TOUCH ULTRA SYSTEM KIT) W/DEVICE KIT 1 kit by Does not apply route once.    Marland Kitchen buPROPion (WELLBUTRIN XL) 150 MG 24 hr tablet Take 2 tablets (300 mg total) by mouth daily. 60 tablet 3  . cetirizine (ZYRTEC) 10 MG  tablet Take 10 mg by mouth daily.    . Cinnamon 500 MG capsule Take 1,000 mg by mouth daily.    Marland Kitchen EPINEPHrine 0.3 mg/0.3 mL IJ SOAJ injection Inject 0.3 mg into the muscle once. Bee stings    . esomeprazole (NEXIUM) 40 MG capsule Take 1 capsule (40 mg total) by mouth 2 (two) times daily before a meal. 60 capsule 3  . furosemide (LASIX) 20 MG tablet Take 20 mg by mouth 2 (two) times daily.     Marland Kitchen gabapentin (NEURONTIN) 600 MG tablet Take 300-1,200 mg by mouth 3 (three) times daily as needed.     Marland Kitchen glucose blood (ONE TOUCH ULTRA TEST) test strip USE TO TEST BLOOD SUGAR AS DIRECTED 100 each 11  . ipratropium (ATROVENT) 0.06 % nasal spray Place 2 sprays into both nostrils 4 (four) times daily. 15 mL 1  . JANUVIA 100 MG tablet TAKE 1 TABLET (100 MG TOTAL) BY MOUTH DAILY. 30 tablet 3  . metFORMIN (GLUCOPHAGE) 1000 MG tablet Take 1 tablet (1,000 mg total) by mouth 2 (two) times daily with a meal. 180 tablet 2  . metoprolol succinate (TOPROL-XL) 50 MG 24 hr  tablet TAKE 1 TABLET BY MOUTH DAILY. TAKE WITH OR IMMEDIATELY FOLLOWING A MEAL 90 tablet 3  . Misc. Devices ([place]) MISC Pt is requesting a walker with wheels and a seat( any kind her insurance will cover is fine) Dx. 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 ONETOUCH DELICA LANCETS 40Z MISC USE TO OBTAIN BLOOD SPECIMEN 100 each 0  . potassium chloride SA (K-DUR,KLOR-CON) 20 MEQ tablet 20 mEq. 2 tabs po qd    . SUMAtriptan Succinate (IMITREX PO) Take 50-100 mg by mouth as needed (1/2 tablet to 1 tablet at onset of headache, may repeat in 2 hours).      No current facility-administered medications on file prior to visit.   Allergies  Allergen Reactions  . Bee Pollen Anaphylaxis    BEE STINGS  . Naproxen Sodium Other (See Comments)    All pain meds, has fatty liver  . Reglan [Metoclopramide] Hives   History   Social History  . Marital Status: Married    Spouse Name: N/A  . Number of  Children: 3  . Years of Education: N/A   Occupational History  . Disabled    Social History Main Topics  . Smoking status: Former Research scientist (life sciences)  . Smokeless tobacco: Not on file  . Alcohol Use: No  . Drug Use: No  . Sexual Activity: Not on file   Other Topics Concern  . Not on file   Social History Narrative      Review of Systems  All other systems reviewed and are negative.      Objective:   Physical Exam  Constitutional: She is oriented to person, place, and time. She appears well-developed and well-nourished.  Neck: No JVD present.  Cardiovascular: Normal rate, regular rhythm, normal heart sounds and intact distal pulses.   No murmur heard. Pulmonary/Chest: Effort normal and breath sounds normal. No respiratory distress. She has no wheezes. She has no rales.  Abdominal: Soft. Bowel sounds are normal. She exhibits no distension. There is no tenderness. There is no rebound and no guarding.  Musculoskeletal: She exhibits no edema.  Lymphadenopathy:    She has no cervical adenopathy.  Neurological: She is alert and oriented to person, place, and time.  Vitals reviewed.         Assessment & Plan:  Diabetes mellitus type II, controlled - Plan: CBC with Differential/Platelet, COMPLETE METABOLIC PANEL WITH GFR, Lipid panel, Hemoglobin A1c, Microalbumin, urine   patient's blood pressure is excellent. I will check a hemoglobin A1c. Goal hemoglobin A1c is less than 6.5. I will check a fasting lipid panel. Goal LDL cholesterol is less than 100. Patient received Pneumovax 23 today in clinic. She is taking an aspirin. I will check a urine microalbumin and if elevated I will start the patient on an angiotensin receptor blocker. Diabetic foot exam and diabetic eye exam are up-to-date

## 2014-10-25 ENCOUNTER — Encounter: Payer: Self-pay | Admitting: Family Medicine

## 2014-10-25 LAB — HEMOGLOBIN A1C
Hgb A1c MFr Bld: 6.1 % — ABNORMAL HIGH (ref <5.7)
Mean Plasma Glucose: 128 mg/dL — ABNORMAL HIGH (ref <117)

## 2014-10-25 LAB — MICROALBUMIN, URINE

## 2014-11-15 ENCOUNTER — Other Ambulatory Visit: Payer: Self-pay | Admitting: Family Medicine

## 2014-11-15 NOTE — Telephone Encounter (Signed)
Medication refilled per protocol. 

## 2014-11-28 ENCOUNTER — Other Ambulatory Visit: Payer: Self-pay | Admitting: Family Medicine

## 2014-12-05 ENCOUNTER — Other Ambulatory Visit: Payer: Self-pay

## 2014-12-05 DIAGNOSIS — Z1231 Encounter for screening mammogram for malignant neoplasm of breast: Secondary | ICD-10-CM

## 2014-12-14 DIAGNOSIS — G43101 Migraine with aura, not intractable, with status migrainosus: Secondary | ICD-10-CM | POA: Diagnosis not present

## 2014-12-14 DIAGNOSIS — G629 Polyneuropathy, unspecified: Secondary | ICD-10-CM | POA: Diagnosis not present

## 2014-12-14 DIAGNOSIS — M47812 Spondylosis without myelopathy or radiculopathy, cervical region: Secondary | ICD-10-CM | POA: Diagnosis not present

## 2014-12-14 DIAGNOSIS — M792 Neuralgia and neuritis, unspecified: Secondary | ICD-10-CM | POA: Diagnosis not present

## 2014-12-23 ENCOUNTER — Encounter: Payer: Self-pay | Admitting: Family Medicine

## 2014-12-26 ENCOUNTER — Encounter: Payer: Self-pay | Admitting: Family Medicine

## 2014-12-26 ENCOUNTER — Ambulatory Visit (INDEPENDENT_AMBULATORY_CARE_PROVIDER_SITE_OTHER): Payer: Medicare Other | Admitting: Family Medicine

## 2014-12-26 VITALS — BP 112/74 | HR 82 | Temp 98.2°F | Resp 18 | Ht 62.0 in | Wt 185.0 lb

## 2014-12-26 DIAGNOSIS — Z0000 Encounter for general adult medical examination without abnormal findings: Secondary | ICD-10-CM

## 2014-12-26 NOTE — Progress Notes (Signed)
Subjective:    Patient ID: Jacqueline [name], female    DOB: November 28, 1965, 48 y.o.   MRN: [id number]  HPI  Patient is here today for a complete physical exam. Her past medical history significant for a hysterectomy. Therefore she does not require Pap smear. The patient had a colonoscopy performed in 2011 which was completely normal. Therefore she is not due for a colonoscopy. She has not had a mammogram since 2015. She is due for mammogram but is scheduled for mammogram next week.Marland Kitchen Her most recent labs were in June and are listed below: Office Visit on 10/24/2014  Component Date Value Ref Range Status  . WBC 10/24/2014 7.6  4.0 - 10.5 K/uL Final  . RBC 10/24/2014 4.57  3.87 - 5.11 MIL/uL Final  . Hemoglobin 10/24/2014 13.5  12.0 - 15.0 g/dL Final  . HCT 10/24/2014 40.8  36.0 - 46.0 % Final  . MCV 10/24/2014 89.3  78.0 - 100.0 fL Final  . MCH 10/24/2014 29.5  26.0 - 34.0 pg Final  . MCHC 10/24/2014 33.1  30.0 - 36.0 g/dL Final  . RDW 10/24/2014 14.1  11.5 - 15.5 % Final  . Platelets 10/24/2014 317  150 - 400 K/uL Final  . MPV 10/24/2014 9.9  8.6 - 12.4 fL Final  . Neutrophils Relative % 10/24/2014 48  43 - 77 % Final  . Neutro Abs 10/24/2014 3.6  1.7 - 7.7 K/uL Final  . Lymphocytes Relative 10/24/2014 44  12 - 46 % Final  . Lymphs Abs 10/24/2014 3.3  0.7 - 4.0 K/uL Final  . Monocytes Relative 10/24/2014 6  3 - 12 % Final  . Monocytes Absolute 10/24/2014 0.5  0.1 - 1.0 K/uL Final  . Eosinophils Relative 10/24/2014 2  0 - 5 % Final  . Eosinophils Absolute 10/24/2014 0.2  0.0 - 0.7 K/uL Final  . Basophils Relative 10/24/2014 0  0 - 1 % Final  . Basophils Absolute 10/24/2014 0.0  0.0 - 0.1 K/uL Final  . Smear Review 10/24/2014 Criteria for review not met   Final  . Sodium 10/24/2014 140  135 - 145 mEq/L Final  . Potassium 10/24/2014 4.3  3.5 - 5.3 mEq/L Final  . Chloride 10/24/2014 101  96 - 112 mEq/L Final  . CO2 10/24/2014 25  19 - 32 mEq/L Final  . Glucose, Bld 10/24/2014 100* 70 - 99  mg/dL Final  . BUN 10/24/2014 11  6 - 23 mg/dL Final  . Creat 10/24/2014 0.76  0.50 - 1.10 mg/dL Final  . Total Bilirubin 10/24/2014 0.3  0.2 - 1.2 mg/dL Final  . Alkaline Phosphatase 10/24/2014 56  39 - 117 U/L Final  . AST 10/24/2014 27  0 - 37 U/L Final  . ALT 10/24/2014 35  0 - 35 U/L Final  . Total Protein 10/24/2014 7.3  6.0 - 8.3 g/dL Final  . Albumin 10/24/2014 4.6  3.5 - 5.2 g/dL Final  . Calcium 10/24/2014 9.7  8.4 - 10.5 mg/dL Final  . GFR, Est African American 10/24/2014 >89   Final  . GFR, Est Non African American 10/24/2014 >89   Final   Comment:   The estimated GFR is a calculation valid for adults (>=45 years old) that uses the CKD-EPI algorithm to adjust for age and sex. It is   not to be used for children, pregnant women, hospitalized patients,    patients on dialysis, or with rapidly changing kidney function. According to the NKDEP, eGFR >89 is normal, 60-89 shows  mild impairment, 30-59 shows moderate impairment, 15-29 shows severe impairment and <15 is ESRD.     Marland Kitchen Cholesterol 10/24/2014 166  0 - 200 mg/dL Final   Comment: ATP III Classification:       < 200        mg/dL        Desirable      200 - 239     mg/dL        Borderline High      >= 240        mg/dL        High     . Triglycerides 10/24/2014 168* <150 mg/dL Final  . HDL 10/24/2014 45* >=46 mg/dL Final   ** Please note change in reference range(s). **  . Total CHOL/HDL Ratio 10/24/2014 3.7   Final  . VLDL 10/24/2014 34  0 - 40 mg/dL Final  . LDL Cholesterol 10/24/2014 87  0 - 99 mg/dL Final   Comment:   Total Cholesterol/HDL Ratio:CHD Risk                        Coronary Heart Disease Risk Table                                        Men       Women          1/2 Average Risk              3.4        3.3              Average Risk              5.0        4.4           2X Average Risk              9.6        7.1           3X Average Risk             23.4       11.0 Use the calculated Patient Ratio  above and the CHD Risk table  to determine the patient's CHD Risk. ATP III Classification (LDL):       < 100        mg/dL         Optimal      100 - 129     mg/dL         Near or Above Optimal      130 - 159     mg/dL         Borderline High      160 - 189     mg/dL         High       > 190        mg/dL         Very High     . Hgb A1c MFr Bld 10/24/2014 6.1* <5.7 % Final   Comment:  According to the ADA Clinical Practice Recommendations for 2011, when HbA1c is used as a screening test:     >=6.5%   Diagnostic of Diabetes Mellitus            (if abnormal result is confirmed)   5.7-6.4%   Increased risk of developing Diabetes Mellitus   References:Diagnosis and Classification of Diabetes Mellitus,Diabetes KXFG,1829,93(ZJIRC 1):S62-S69 and Standards of Medical Care in         Diabetes - 2011,Diabetes VELF,8101,75 (Suppl 1):S11-S61.     . Mean Plasma Glucose 10/24/2014 128* <117 mg/dL Final  . Microalb, Ur 10/24/2014 <0.2  <2.0 mg/dL Final   Comment: The ADA (Diabetes Care 1025;85(IDPOE 1):S14-S80) has defined abnormalities in albumin excretion as follows:            Category           Result                            (mg/g creatinine)                 Normal:    <30       Microalbuminuria:    30 - 299   Clinical albuminuria:    > or = 300    The ADA recommends that at least two of three specimens collected within a 3 - 6 month period be abnormal before considering a patient to be within a diagnostic category.    .  She sees an eye doctor every year due to her diabetes. She does take an aspirin 81 mg by mouth daily. She is overdue for diabetic foot exam. Otherwise she is doing well.   Past Medical History  Diagnosis Date  . Fatty liver   . IBS (irritable bowel syndrome)   . GERD (gastroesophageal reflux disease)   . Anemia   . Anxiety disorder   . Arthritis   . Asthma   . Chronic headaches   .  Kidney stones   . Pneumonia   . Obesity   . Erosive esophagitis   . Abnormal transaminases   . Sinus tachycardia   . Cellulitis   . DM (diabetes mellitus)   . Diverticulitis   . Pseudotumor cerebri    Past Surgical History  Procedure Laterality Date  . Nasal septum surgery    . Ankle surgery    . Foot surgery    . Ovarian cysts      x3  . Tonsillectomy    . Tubal ligation    . Egd  06/09/2009  . Colonoscopy  06/19/2009  . Vaginal hysterectomy     Current Outpatient Prescriptions on File Prior to Visit  Medication Sig Dispense Refill  . albuterol (PROVENTIL HFA;VENTOLIN HFA) 108 (90 BASE) MCG/ACT inhaler Inhale 2 puffs into the lungs every 6 (six) hours as needed.    Marland Kitchen aspirin EC 81 MG tablet Take 81 mg by mouth daily.    Marland Kitchen atorvastatin (LIPITOR) 40 MG tablet TAKE 1 TABLET BY MOUTH EVERY DAY 90 tablet 1  . Blood Glucose Monitoring Suppl (ONE TOUCH ULTRA SYSTEM KIT) W/DEVICE KIT 1 kit by Does not apply route once.    Marland Kitchen buPROPion (WELLBUTRIN XL) 150 MG 24 hr tablet Take 2 tablets (300 mg total) by mouth daily. 60 tablet 3  . cetirizine (ZYRTEC) 10 MG tablet Take 10 mg by mouth daily.    . Cinnamon 500 MG capsule Take 1,000 mg by mouth daily.    Marland Kitchen  EPINEPHrine 0.3 mg/0.3 mL IJ SOAJ injection Inject 0.3 mg into the muscle once. Bee stings    . esomeprazole (NEXIUM) 40 MG capsule Take 1 capsule (40 mg total) by mouth 2 (two) times daily before a meal. 60 capsule 3  . furosemide (LASIX) 20 MG tablet Take 20 mg by mouth 2 (two) times daily.     Marland Kitchen gabapentin (NEURONTIN) 600 MG tablet Take 300-1,200 mg by mouth 3 (three) times daily as needed.     Marland Kitchen glucose blood (ONE TOUCH ULTRA TEST) test strip USE TO TEST BLOOD SUGAR AS DIRECTED 100 each 11  . ipratropium (ATROVENT) 0.06 % nasal spray Place 2 sprays into both nostrils 4 (four) times daily. 15 mL 1  . JANUVIA 100 MG tablet TAKE 1 TABLET (100 MG TOTAL) BY MOUTH DAILY. 30 tablet 3  . metFORMIN (GLUCOPHAGE) 1000 MG tablet Take 1 tablet  (1,000 mg total) by mouth 2 (two) times daily with a meal. 180 tablet 2  . metoprolol succinate (TOPROL-XL) 50 MG 24 hr tablet TAKE 1 TABLET BY MOUTH DAILY. TAKE WITH OR IMMEDIATELY FOLLOWING A MEAL 90 tablet 3  . Misc. Devices ([place]) MISC Pt is requesting a walker with wheels and a seat( any kind her insurance will cover is fine) Dx. 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 ONETOUCH DELICA LANCETS 52D MISC USE TO OBTAIN BLOOD SPECIMEN 100 each 0  . potassium chloride SA (K-DUR,KLOR-CON) 20 MEQ tablet 20 mEq. 2 tabs po qd    . SUMAtriptan Succinate (IMITREX PO) Take 50-100 mg by mouth as needed (1/2 tablet to 1 tablet at onset of headache, may repeat in 2 hours).      No current facility-administered medications on file prior to visit.   Allergies  Allergen Reactions  . Bee Pollen Anaphylaxis    BEE STINGS  . Naproxen Sodium Other (See Comments)    All pain meds, has fatty liver  . Reglan [Metoclopramide] Hives   Social History   Social History  . Marital Status: Married    Spouse Name: N/A  . Number of Children: 3  . Years of Education: N/A   Occupational History  . Disabled    Social History Main Topics  . Smoking status: Former Research scientist (life sciences)  . Smokeless tobacco: Not on file  . Alcohol Use: No  . Drug Use: No  . Sexual Activity: Not on file   Other Topics Concern  . Not on file   Social History Narrative   Family History  Problem Relation Age of Onset  . Colitis    . Crohn's disease    . Breast cancer    . Ovarian cancer    . Celiac disease    . Clotting disorder    . Cystic fibrosis      pat. cousin  . Diabetes Mother   . Heart disease    . Irritable bowel syndrome    . Kidney failure  31    mat. nephew  . Colon cancer Neg Hx   . Diabetes Maternal Aunt   . Diabetes Son   . Diabetes      mat. nephew      Review of Systems  All other systems reviewed and are negative.      Objective:    Physical Exam  Constitutional: She is oriented to person, place, and time. She appears well-developed and well-nourished. No distress.  HENT:  Head:  Normocephalic and atraumatic.  Right Ear: External ear normal.  Left Ear: External ear normal.  Nose: Nose normal.  Mouth/Throat: Oropharynx is clear and moist. No oropharyngeal exudate.  Eyes: Conjunctivae and EOM are normal. Pupils are equal, round, and reactive to light. Right eye exhibits no discharge. Left eye exhibits no discharge. No scleral icterus.  Neck: Normal range of motion. Neck supple. No JVD present. No thyromegaly present.  Cardiovascular: Normal rate, regular rhythm, normal heart sounds and intact distal pulses.  Exam reveals no gallop and no friction rub.   No murmur heard. Pulmonary/Chest: Effort normal and breath sounds normal. No stridor. No respiratory distress. She has no wheezes. She has no rales. She exhibits no tenderness.  Abdominal: Soft. Bowel sounds are normal. She exhibits no distension and no mass. There is no tenderness. There is no rebound and no guarding.  Musculoskeletal: Normal range of motion. She exhibits no edema or tenderness.  Lymphadenopathy:    She has no cervical adenopathy.  Neurological: She is alert and oriented to person, place, and time. She has normal reflexes. No cranial nerve deficit. She exhibits normal muscle tone. Coordination normal.  Skin: Skin is warm. No rash noted. She is not diaphoretic. No erythema. No pallor.  Psychiatric: She has a normal mood and affect. Her behavior is normal. Judgment and thought content normal.  Vitals reviewed.         Assessment & Plan:  Routine general medical examination at a health care facility  Patient's physical exam is normal except for obesity. I recommended diet exercise and weight loss, particularly given her history of fatty liver disease.  However patient has lost 4 pounds since when I last saw her. She is trying to change her diet and  lifestyle. Her cholesterol is much better and her liver test and actually normalized. I congratulated the patient on the success. The remainder of her preventative care is up-to-date.  Diabetic eye exam diabetic and  foot exam are up-to-date. Recommended cancer screening is up to date

## 2014-12-31 ENCOUNTER — Other Ambulatory Visit: Payer: Self-pay | Admitting: Family Medicine

## 2015-01-02 ENCOUNTER — Ambulatory Visit
Admission: RE | Admit: 2015-01-02 | Discharge: 2015-01-02 | Disposition: A | Discharge status: Home / Self Care | Payer: Medicare Other | Source: Ambulatory Visit

## 2015-01-02 DIAGNOSIS — Z1231 Encounter for screening mammogram for malignant neoplasm of breast: Secondary | ICD-10-CM | POA: Diagnosis not present

## 2015-01-11 ENCOUNTER — Telehealth: Payer: Self-pay | Admitting: Family Medicine

## 2015-01-11 MED ORDER — ONETOUCH ULTRA SYSTEM W/DEVICE KIT: 1.0000 | PACK | Freq: Once | Status: DC

## 2015-01-11 NOTE — Telephone Encounter (Signed)
Patient asking for new rx for one touch meter be sent to [facility] [place] [facility] if possible  [phone]

## 2015-01-11 NOTE — Telephone Encounter (Signed)
Sent to pharm for new meter

## 2015-01-23 ENCOUNTER — Other Ambulatory Visit: Payer: Self-pay | Admitting: Family Medicine

## 2015-02-07 ENCOUNTER — Ambulatory Visit (INDEPENDENT_AMBULATORY_CARE_PROVIDER_SITE_OTHER): Payer: Medicare Other | Admitting: Family Medicine

## 2015-02-07 ENCOUNTER — Encounter: Payer: Self-pay | Admitting: Family Medicine

## 2015-02-07 DIAGNOSIS — H00016 Hordeolum externum left eye, unspecified eyelid: Secondary | ICD-10-CM | POA: Diagnosis not present

## 2015-02-07 MED ORDER — ERYTHROMYCIN 5 MG/GM OP OINT: 1.0000 "application " | TOPICAL_OINTMENT | Freq: Two times a day (BID) | OPHTHALMIC | Status: DC

## 2015-02-07 NOTE — Progress Notes (Signed)
Subjective:    Patient ID: Jacqueline [name], female    DOB: May 28, 1965, 49 y.o.   MRN: [id number]  HPI Patient reports a 2 day history of swelling and pain in the left lower eyelid adjacent to the tear duct. She has an evolving stye in the medial portion of the eyelid in that area. There is no evidence of dacrocystitis. Past Medical History  Diagnosis Date  . Fatty liver   . IBS (irritable bowel syndrome)   . GERD (gastroesophageal reflux disease)   . Anemia   . Anxiety disorder   . Arthritis   . Asthma   . Chronic headaches   . Kidney stones   . Pneumonia   . Obesity   . Erosive esophagitis   . Abnormal transaminases   . Sinus tachycardia   . Cellulitis   . DM (diabetes mellitus)   . Diverticulitis   . Pseudotumor cerebri    Past Surgical History  Procedure Laterality Date  . Nasal septum surgery    . Ankle surgery    . Foot surgery    . Ovarian cysts      x3  . Tonsillectomy    . Tubal ligation    . Egd  06/09/2009  . Colonoscopy  06/19/2009  . Vaginal hysterectomy     Current Outpatient Prescriptions on File Prior to Visit  Medication Sig Dispense Refill  . albuterol (PROVENTIL HFA;VENTOLIN HFA) 108 (90 BASE) MCG/ACT inhaler Inhale 2 puffs into the lungs every 6 (six) hours as needed.    Marland Kitchen aspirin EC 81 MG tablet Take 81 mg by mouth daily.    Marland Kitchen atorvastatin (LIPITOR) 40 MG tablet TAKE 1 TABLET BY MOUTH EVERY DAY 90 tablet 1  . atorvastatin (LIPITOR) 40 MG tablet TAKE 1 TABLET BY MOUTH EVERY DAY 30 tablet 3  . Blood Glucose Monitoring Suppl (ONE TOUCH ULTRA SYSTEM KIT) W/DEVICE KIT 1 kit by Does not apply route once. 1 each 0  . buPROPion (WELLBUTRIN XL) 150 MG 24 hr tablet TAKE 2 TABLETS BY MOUTH EVERY DAY 60 tablet 11  . cetirizine (ZYRTEC) 10 MG tablet Take 10 mg by mouth daily.    . Cinnamon 500 MG capsule Take 1,000 mg by mouth daily.    . Cyanocobalamin (VITAMIN B 12 PO) Take by mouth.    . EPINEPHrine 0.3 mg/0.3 mL IJ SOAJ injection Inject 0.3 mg into the  muscle once. Bee stings    . esomeprazole (NEXIUM) 40 MG capsule Take 1 capsule (40 mg total) by mouth 2 (two) times daily before a meal. 60 capsule 3  . furosemide (LASIX) 20 MG tablet Take 20 mg by mouth 2 (two) times daily.     Marland Kitchen gabapentin (NEURONTIN) 600 MG tablet Take 300-1,200 mg by mouth 3 (three) times daily as needed.     Marland Kitchen glucose blood (ONE TOUCH ULTRA TEST) test strip USE TO TEST BLOOD SUGAR AS DIRECTED 100 each 11  . ipratropium (ATROVENT) 0.06 % nasal spray Place 2 sprays into both nostrils 4 (four) times daily. 15 mL 1  . JANUVIA 100 MG tablet TAKE 1 TABLET (100 MG TOTAL) BY MOUTH DAILY. 30 tablet 3  . metFORMIN (GLUCOPHAGE) 1000 MG tablet TAKE 1 TABLET (1,000 MG TOTAL) BY MOUTH 2 (TWO) TIMES DAILY WITH A MEAL. 180 tablet 3  . metoprolol succinate (TOPROL-XL) 50 MG 24 hr tablet TAKE 1 TABLET BY MOUTH DAILY. TAKE WITH OR IMMEDIATELY FOLLOWING A MEAL 90 tablet 3  . Misc. Devices (HUGO ROLLING  WALKER) MISC Pt is requesting a walker with wheels and a seat( any kind her insurance will cover is fine) Dx. 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 ONETOUCH DELICA LANCETS 20N MISC USE TO OBTAIN BLOOD SPECIMEN 100 each 0  . potassium chloride SA (K-DUR,KLOR-CON) 20 MEQ tablet 20 mEq. 2 tabs po qd    . SUMAtriptan Succinate (IMITREX PO) Take 50-100 mg by mouth as needed (1/2 tablet to 1 tablet at onset of headache, may repeat in 2 hours).      No current facility-administered medications on file prior to visit.   Allergies  Allergen Reactions  . Bee Pollen Anaphylaxis    BEE STINGS  . Naproxen Sodium Other (See Comments)    All pain meds, has fatty liver  . Reglan [Metoclopramide] Hives   Social History   Social History  . Marital Status: Married    Spouse Name: N/A  . Number of Children: 3  . Years of Education: N/A   Occupational History  . Disabled    Social History Main Topics  . Smoking status: Former Research scientist (life sciences)  . Smokeless  tobacco: Not on file  . Alcohol Use: No  . Drug Use: No  . Sexual Activity: Not on file   Other Topics Concern  . Not on file   Social History Narrative     Review of Systems  All other systems reviewed and are negative.      Objective:   Physical Exam  Constitutional: She appears well-developed and well-nourished.  Cardiovascular: Normal rate, regular rhythm and normal heart sounds.   Pulmonary/Chest: Effort normal and breath sounds normal. No respiratory distress. She has no wheezes. She has no rales.  Vitals reviewed.  Left medial lower eyelid is erythematous and swollen with an evolving hordeolum.  It is tender and sore.       Assessment & Plan:  Hordeolum externum, left - Plan: erythromycin [facility]) ophthalmic ointment   use of erythromycin ointment twice a day for the next 5 days. Apply warm wet compresses to the eye 3-4 times a day.  Recheck in one week if no better or sooner if worse

## 2015-03-15 ENCOUNTER — Encounter: Payer: Self-pay | Admitting: Family Medicine

## 2015-03-15 ENCOUNTER — Ambulatory Visit (INDEPENDENT_AMBULATORY_CARE_PROVIDER_SITE_OTHER): Payer: Medicare Other | Admitting: Family Medicine

## 2015-03-15 VITALS — BP 126/76 | HR 100 | Temp 98.7°F | Resp 20 | Ht 62.0 in | Wt 186.0 lb

## 2015-03-15 DIAGNOSIS — J019 Acute sinusitis, unspecified: Secondary | ICD-10-CM | POA: Diagnosis not present

## 2015-03-15 DIAGNOSIS — E1143 Type 2 diabetes mellitus with diabetic autonomic (poly)neuropathy: Secondary | ICD-10-CM | POA: Diagnosis not present

## 2015-03-15 MED ORDER — ALBUTEROL SULFATE 108 (90 BASE) MCG/ACT IN AEPB: 2.0000 | INHALATION_SPRAY | RESPIRATORY_TRACT | Status: DC

## 2015-03-15 NOTE — Patient Instructions (Signed)
Take the augmentin twice a day Use the robitussin Pro-air as needed for asthma  Take the Janumet twice a day -- DO NOT TAKE METFORMIN WITH THIS F/U as needed

## 2015-03-15 NOTE — Progress Notes (Signed)
Patient ID: Jacqueline [name], female   DOB: 1965/08/21, 49 y.o.   MRN: [id number]   Subjective:    Patient ID: Jacqueline [name], female    DOB: 27-Jun-1965, 49 y.o.   MRN: [id number]  Patient presents for Illness  issue here with sinus pressure or drainage worse on the right side for the past 2 weeks. She has history of recurrent sinusitis. She now has some sore throat and cough from the drainage. She uses her nasal rinses she's been taking some Robitussin. She has not had any fever but has had some chills and feels fatigued. Her discharge is now a dark green.  She has history of diabetes mellitus she is out of her Januvia as she is in the donut hole does cost her about 200 also month. She requests samples or an alternative for now.    Review Of Systems:  GEN- denies fatigue, fever, weight loss,weakness, recent illness HEENT- denies eye drainage, change in vision, +nasal discharge, CVS- denies chest pain, palpitations RESP- denies SOB, +cough, wheeze Neuro- denies headache, dizziness, syncope, seizure activity       Objective:    BP 126/76 mmHg  Pulse 100  Temp(Src) 98.7 F (37.1 C) (Oral)  Resp 20  Ht 5' 2"  (1.575 m)  Wt 186 lb (84.369 kg)  BMI 34.01 kg/m2  SpO2 97% GEN- NAD, alert and oriented x3 HEENT- PERRL, EOMI, non injected sclera, pink conjunctiva, MMM, oropharynx mild injection, TM clear bilat no effusion,  + maxillary sinus tenderness R >l, inflammed turbinates,  Nasal drainage  Neck- Supple, shotty anterior  LAD CVS- RRR, no murmur RESP-CTAB EXT- No edema Pulses- Radial 2+         Assessment & Plan:      Problem List Items Addressed This Visit    Diabetes mellitus, type II ([place])    Diabetes has been well controlled, she is in donut hole, given samples of Janumet 100/1000mg XR to take for now, hold MTF Will try to get some assitance for Januvia       Other Visit Diagnoses    Acute rhinosinusitis    -  Primary    Treat with augmentin, mucinex or  robitussin, given Pro air script, has asthma and often this causes flares, currently no asthma symptoms       Note: This dictation was prepared with Dragon dictation along with smaller phrase technology. Any transcriptional errors that result from this process are unintentional.

## 2015-03-15 NOTE — Assessment & Plan Note (Signed)
Diabetes has been well controlled, she is in donut hole, given samples of Janumet 100/1000mg XR to take for now, hold MTF Will try to get some assitance for Januvia

## 2015-03-16 ENCOUNTER — Other Ambulatory Visit: Payer: Self-pay | Admitting: *Deleted

## 2015-03-16 ENCOUNTER — Telehealth: Payer: Self-pay | Admitting: *Deleted

## 2015-03-16 MED ORDER — AMOXICILLIN-POT CLAVULANATE 875-125 MG PO TABS
1.0000 | ORAL_TABLET | Freq: Two times a day (BID) | ORAL | Status: DC
Start: 2015-03-16 — End: 2015-10-26

## 2015-03-16 NOTE — Telephone Encounter (Signed)
Augmentin 875 BID x7 sent in per provider protocol.

## 2015-03-16 NOTE — Telephone Encounter (Signed)
Pt called stating was in office and was prescribed antibiotic Augmentin and was told it would be at pharmacy, pt went to pick up and stated is not there.   I looked in pt's chart to see if was prescribed and saw on her ov note stating to start augmentin but was not sent to pharmacy.  I need to know what dose pt needs to be on so that I can send it to pharmacy. Please advise!

## 2015-03-17 NOTE — Telephone Encounter (Signed)
noted 

## 2015-04-15 ENCOUNTER — Other Ambulatory Visit: Payer: Self-pay | Admitting: Family Medicine

## 2015-05-03 ENCOUNTER — Other Ambulatory Visit: Payer: Self-pay | Admitting: Family Medicine

## 2015-06-26 ENCOUNTER — Telehealth: Payer: Self-pay | Admitting: Family Medicine

## 2015-06-26 NOTE — Telephone Encounter (Signed)
Pt wanted to let Dr. Dennard Schaumann know that a nurse with her insurance company came and drew her A1C and it was at 5.9. Call if needed [phone]

## 2015-07-04 ENCOUNTER — Other Ambulatory Visit: Payer: Self-pay | Admitting: Family Medicine

## 2015-07-04 NOTE — Telephone Encounter (Signed)
Refill appropriate and filled per protocol. 

## 2015-07-12 DIAGNOSIS — G43101 Migraine with aura, not intractable, with status migrainosus: Secondary | ICD-10-CM | POA: Diagnosis not present

## 2015-07-12 DIAGNOSIS — M47812 Spondylosis without myelopathy or radiculopathy, cervical region: Secondary | ICD-10-CM | POA: Diagnosis not present

## 2015-07-12 DIAGNOSIS — M792 Neuralgia and neuritis, unspecified: Secondary | ICD-10-CM | POA: Diagnosis not present

## 2015-09-09 ENCOUNTER — Other Ambulatory Visit: Payer: Self-pay | Admitting: Family Medicine

## 2015-09-13 ENCOUNTER — Telehealth: Payer: Self-pay | Admitting: Family Medicine

## 2015-09-13 NOTE — Telephone Encounter (Signed)
Samples left up front and pt aware

## 2015-09-13 NOTE — Telephone Encounter (Signed)
Pt is requesting samples of Januvia 100 mg  [phone]

## 2015-10-09 ENCOUNTER — Other Ambulatory Visit: Payer: Self-pay | Admitting: Family Medicine

## 2015-10-10 ENCOUNTER — Other Ambulatory Visit: Payer: Self-pay | Admitting: Family Medicine

## 2015-10-10 ENCOUNTER — Encounter: Payer: Self-pay | Admitting: Family Medicine

## 2015-10-10 NOTE — Telephone Encounter (Signed)
Refill appropriate and filled per protocol. 

## 2015-10-24 ENCOUNTER — Other Ambulatory Visit: Payer: Medicare Other

## 2015-10-24 DIAGNOSIS — E1165 Type 2 diabetes mellitus with hyperglycemia: Secondary | ICD-10-CM

## 2015-10-24 DIAGNOSIS — K760 Fatty (change of) liver, not elsewhere classified: Secondary | ICD-10-CM

## 2015-10-24 DIAGNOSIS — E669 Obesity, unspecified: Secondary | ICD-10-CM

## 2015-10-24 DIAGNOSIS — E0840 Diabetes mellitus due to underlying condition with diabetic neuropathy, unspecified: Secondary | ICD-10-CM | POA: Diagnosis not present

## 2015-10-24 DIAGNOSIS — IMO0002 Reserved for concepts with insufficient information to code with codable children: Secondary | ICD-10-CM

## 2015-10-24 DIAGNOSIS — E785 Hyperlipidemia, unspecified: Secondary | ICD-10-CM

## 2015-10-24 DIAGNOSIS — Z79899 Other long term (current) drug therapy: Secondary | ICD-10-CM

## 2015-10-24 DIAGNOSIS — E1169 Type 2 diabetes mellitus with other specified complication: Secondary | ICD-10-CM | POA: Diagnosis not present

## 2015-10-24 LAB — LIPID PANEL
CHOL/HDL RATIO: 3.3 ratio (ref <=5.0)
CHOLESTEROL: 166 mg/dL (ref 125–200)
HDL: 50 mg/dL (ref >=46)
LDL Cholesterol: 81 mg/dL (ref <130)
TRIGLYCERIDES: 177 mg/dL — AB (ref <150)
VLDL: 35 mg/dL — AB (ref <30)

## 2015-10-24 LAB — COMPLETE METABOLIC PANEL WITH GFR
ALT: 93 U/L — AB (ref 6–29)
AST: 76 U/L — AB (ref 10–35)
Albumin: 4.7 g/dL (ref 3.6–5.1)
Alkaline Phosphatase: 60 U/L (ref 33–115)
BUN: 11 mg/dL (ref 7–25)
CALCIUM: 9.6 mg/dL (ref 8.6–10.2)
CO2: 24 mmol/L (ref 20–31)
Chloride: 100 mmol/L (ref 98–110)
Creat: 0.8 mg/dL (ref 0.50–1.10)
GFR, Est Non African American: 88 mL/min (ref >=60)
Glucose, Bld: 106 mg/dL — ABNORMAL HIGH (ref 70–99)
POTASSIUM: 4.1 mmol/L (ref 3.5–5.3)
Sodium: 139 mmol/L (ref 135–146)
Total Bilirubin: 0.3 mg/dL (ref 0.2–1.2)
Total Protein: 7.3 g/dL (ref 6.1–8.1)

## 2015-10-24 LAB — TSH: TSH: 3.03 m[IU]/L

## 2015-10-24 LAB — CBC WITH DIFFERENTIAL/PLATELET
BASOS ABS: 0 {cells}/uL (ref 0–200)
BASOS PCT: 0 %
Eosinophils Absolute: 126 cells/uL (ref 15–500)
Eosinophils Relative: 2 %
HEMATOCRIT: 40 % (ref 35.0–45.0)
Hemoglobin: 13 g/dL (ref 12.0–15.0)
Lymphocytes Relative: 45 %
Lymphs Abs: 2835 cells/uL (ref 850–3900)
MCH: 29.1 pg (ref 27.0–33.0)
MCHC: 32.5 g/dL (ref 32.0–36.0)
MCV: 89.5 fL (ref 80.0–100.0)
MONO ABS: 378 {cells}/uL (ref 200–950)
MONOS PCT: 6 %
MPV: 9.9 fL (ref 7.5–12.5)
Neutro Abs: 2961 cells/uL (ref 1500–7800)
Neutrophils Relative %: 47 %
Platelets: 332 10*3/uL (ref 140–400)
RBC: 4.47 MIL/uL (ref 3.80–5.10)
RDW: 13.9 % (ref 11.0–15.0)
WBC: 6.3 10*3/uL (ref 3.8–10.8)

## 2015-10-24 LAB — HEMOGLOBIN A1C
Hgb A1c MFr Bld: 6.1 % — ABNORMAL HIGH (ref <5.7)
Mean Plasma Glucose: 128 mg/dL

## 2015-10-25 LAB — MICROALBUMIN / CREATININE URINE RATIO
Creatinine, Urine: 27 mg/dL (ref 20–320)
Microalb, Ur: <0.2 mg/dL

## 2015-10-26 ENCOUNTER — Encounter: Payer: Self-pay | Admitting: Family Medicine

## 2015-10-26 ENCOUNTER — Ambulatory Visit (INDEPENDENT_AMBULATORY_CARE_PROVIDER_SITE_OTHER): Payer: Medicare Other | Admitting: Family Medicine

## 2015-10-26 VITALS — BP 130/78 | HR 80 | Temp 97.8°F | Resp 18 | Ht 62.0 in | Wt 192.0 lb

## 2015-10-26 DIAGNOSIS — I10 Essential (primary) hypertension: Secondary | ICD-10-CM | POA: Diagnosis not present

## 2015-10-26 DIAGNOSIS — K760 Fatty (change of) liver, not elsewhere classified: Secondary | ICD-10-CM

## 2015-10-26 DIAGNOSIS — E785 Hyperlipidemia, unspecified: Secondary | ICD-10-CM | POA: Diagnosis not present

## 2015-10-26 DIAGNOSIS — E1143 Type 2 diabetes mellitus with diabetic autonomic (poly)neuropathy: Secondary | ICD-10-CM

## 2015-10-26 NOTE — Progress Notes (Signed)
Subjective:    Patient ID: Jacqueline [name], female    DOB: 03-29-1966, 50 y.o.   MRN: [id number]  HPI  Patient is here for diabetes follow up. She denies polyuria, polydipsia, or blurred vision. All of her blood sugars are below 150. She denies any hypoglycemia. Diabetic eye exam is due this summer.  Her blood pressure is excellent. She denies any chest pain shortness of breath or dyspnea on exertion. She is compliant in taking an aspirin every day.  She does report burning and stinging neuropathy in her hands and in her feet but she denies any numbness in her feet. Her liver test however have increased over the last year. She has underlying history of fatty liver disease. She admits that she has not been exercising and watching her diet as closely as she did in the past. She also reports subxiphoid pain worsened with spicy foods. She has not been taking Nexium on a daily basis Past Medical History  Diagnosis Date  . Fatty liver   . IBS (irritable bowel syndrome)   . GERD (gastroesophageal reflux disease)   . Anemia   . Anxiety disorder   . Arthritis   . Asthma   . Chronic headaches   . Kidney stones   . Pneumonia   . Obesity   . Erosive esophagitis   . Abnormal transaminases   . Sinus tachycardia ([place])   . Cellulitis   . DM (diabetes mellitus) ([place])   . Diverticulitis   . Pseudotumor cerebri    Past Surgical History  Procedure Laterality Date  . Nasal septum surgery    . Ankle surgery    . Foot surgery    . Ovarian cysts      x3  . Tonsillectomy    . Tubal ligation    . Egd  06/09/2009  . Colonoscopy  06/19/2009  . Vaginal hysterectomy     Current Outpatient Prescriptions on File Prior to Visit  Medication Sig Dispense Refill  . Albuterol Sulfate (PROAIR RESPICLICK) 659 (90 BASE) MCG/ACT AEPB Inhale 2 puffs into the lungs every 4 (four) hours. 1 each 0  . aspirin EC 81 MG tablet Take 81 mg by mouth daily.    Marland Kitchen atorvastatin (LIPITOR) 40 MG tablet TAKE 1 TABLET BY MOUTH  EVERY DAY 90 tablet 1  . Blood Glucose Monitoring Suppl (ONE TOUCH ULTRA SYSTEM KIT) W/DEVICE KIT 1 kit by Does not apply route once. 1 each 0  . buPROPion (WELLBUTRIN XL) 150 MG 24 hr tablet TAKE 2 TABLETS BY MOUTH EVERY DAY 60 tablet 11  . cetirizine (ZYRTEC) 10 MG tablet Take 10 mg by mouth daily.    . Cinnamon 500 MG capsule Take 1,000 mg by mouth daily.    . Cyanocobalamin (VITAMIN B 12 PO) Take by mouth.    . EPINEPHrine 0.3 mg/0.3 mL IJ SOAJ injection Inject 0.3 mg into the muscle once. Bee stings    . esomeprazole (NEXIUM) 40 MG capsule Take 1 capsule (40 mg total) by mouth 2 (two) times daily before a meal. 60 capsule 3  . furosemide (LASIX) 20 MG tablet Take 20 mg by mouth 2 (two) times daily.     Marland Kitchen gabapentin (NEURONTIN) 600 MG tablet Take 300-1,200 mg by mouth 3 (three) times daily as needed.     Marland Kitchen JANUVIA 100 MG tablet TAKE 1 TABLET (100 MG TOTAL) BY MOUTH DAILY. 30 tablet 3  . metFORMIN (GLUCOPHAGE) 1000 MG tablet TAKE 1 TABLET (1,000 MG TOTAL) BY  MOUTH 2 (TWO) TIMES DAILY WITH A MEAL. 180 tablet 3  . metoprolol succinate (TOPROL-XL) 50 MG 24 hr tablet TAKE 1 TABLET BY MOUTH DAILY. TAKE WITH OR IMMEDIATELY FOLLOWING A MEAL 90 tablet 3  . Misc. Devices (HUGO ROLLING WALKER) MISC Pt is requesting a walker with wheels and a seat( any kind her insurance will cover is fine) Dx. 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 ONE TOUCH ULTRA TEST test strip USE TO TEST BLOOD SUGAR AS DIRECTED 100 each 4  . ONETOUCH DELICA LANCETS 63A MISC USE TO OBTAIN BLOOD SPECIMEN 100 each 0  . ONETOUCH DELICA LANCETS 45X MISC USE AS DIRECTED 200 each 1   No current facility-administered medications on file prior to visit.   Allergies  Allergen Reactions  . Bee Pollen Anaphylaxis    BEE STINGS  . Naproxen Sodium Other (See Comments)    All pain meds, has fatty liver  . Reglan [Metoclopramide] Hives   Social History   Social History  . Marital Status:  Married    Spouse Name: N/A  . Number of Children: 3  . Years of Education: N/A   Occupational History  . Disabled    Social History Main Topics  . Smoking status: Former Research scientist (life sciences)  . Smokeless tobacco: Not on file  . Alcohol Use: No  . Drug Use: No  . Sexual Activity: Not on file   Other Topics Concern  . Not on file   Social History Narrative      Review of Systems  All other systems reviewed and are negative.      Objective:   Physical Exam  Constitutional: She is oriented to person, place, and time. She appears well-developed and well-nourished.  Neck: No JVD present.  Cardiovascular: Normal rate, regular rhythm, normal heart sounds and intact distal pulses.   No murmur heard. Pulmonary/Chest: Effort normal and breath sounds normal. No respiratory distress. She has no wheezes. She has no rales.  Abdominal: Soft. Bowel sounds are normal. She exhibits no distension. There is no tenderness. There is no rebound and no guarding.  Musculoskeletal: She exhibits no edema.  Lymphadenopathy:    She has no cervical adenopathy.  Neurological: She is alert and oriented to person, place, and time.  Vitals reviewed.         Assessment & Plan:  Type 2 diabetes mellitus with diabetic autonomic neuropathy, without long-term current use of insulin (HCC)  Fatty liver  Benign essential HTN  HLD (hyperlipidemia) Her blood pressure is excellent. Her hemoglobin A1c is outstanding at 6.1. Her LDL cholesterol is excellent at less than 100. However I am concerned because her liver tests have increased. I recommended a low fat diet, 30 minutes of aerobic exercise on a daily basis, and recheck liver function test in 3 months. I recommended Nexium on a daily basis for what I suspect is GERD or possibly mild gastritis. Consider cholelithiasis if symptoms worsen. I continue to recommend diet exercise and weight loss.  She will schedule a diabetic eye exam

## 2015-10-29 ENCOUNTER — Other Ambulatory Visit: Payer: Self-pay | Admitting: Family Medicine

## 2015-10-31 NOTE — Telephone Encounter (Signed)
Refill appropriate and filled per protocol. 

## 2015-11-06 ENCOUNTER — Other Ambulatory Visit: Payer: Self-pay | Admitting: Family Medicine

## 2015-11-13 ENCOUNTER — Other Ambulatory Visit: Payer: Self-pay | Admitting: Family Medicine

## 2015-11-20 ENCOUNTER — Encounter: Payer: Self-pay | Admitting: Family Medicine

## 2015-11-20 ENCOUNTER — Ambulatory Visit (INDEPENDENT_AMBULATORY_CARE_PROVIDER_SITE_OTHER): Payer: Medicare Other | Admitting: Family Medicine

## 2015-11-20 ENCOUNTER — Other Ambulatory Visit: Payer: Self-pay | Admitting: Family Medicine

## 2015-11-20 VITALS — BP 134/74 | HR 84 | Temp 97.8°F | Resp 18 | Ht 62.0 in | Wt 191.0 lb

## 2015-11-20 DIAGNOSIS — R21 Rash and other nonspecific skin eruption: Secondary | ICD-10-CM

## 2015-11-20 DIAGNOSIS — R238 Other skin changes: Secondary | ICD-10-CM

## 2015-11-20 MED ORDER — VALACYCLOVIR HCL 1 G PO TABS: 1000.0000 mg | ORAL_TABLET | Freq: Two times a day (BID) | ORAL | Status: DC

## 2015-11-20 NOTE — Progress Notes (Signed)
Subjective:    Patient ID: Jacqueline [name], female    DOB: [birth year], 50 y.o.   MRN: [id number]  HPI Patient reports a recurrent herpetiform rash on the posterior aspect of her right thigh. This comes and goes around the same time every year. The rash is a cluster of clear vesicles clumping together in a group over an erythematous base. The entire conglomeration is approximately 3 cm x 5 cm. It is extremely itchy and burning. Past Medical History  Diagnosis Date  . Fatty liver   . IBS (irritable bowel syndrome)   . GERD (gastroesophageal reflux disease)   . Anemia   . Anxiety disorder   . Arthritis   . Asthma   . Chronic headaches   . Kidney stones   . Pneumonia   . Obesity   . Erosive esophagitis   . Abnormal transaminases   . Sinus tachycardia ([place])   . Cellulitis   . DM (diabetes mellitus) ([place])   . Diverticulitis   . Pseudotumor cerebri    Past Surgical History  Procedure Laterality Date  . Nasal septum surgery    . Ankle surgery    . Foot surgery    . Ovarian cysts      x3  . Tonsillectomy    . Tubal ligation    . Egd  06/09/2009  . Colonoscopy  06/19/2009  . Vaginal hysterectomy     Current Outpatient Prescriptions on File Prior to Visit  Medication Sig Dispense Refill  . Albuterol Sulfate (PROAIR RESPICLICK) 621 (90 BASE) MCG/ACT AEPB Inhale 2 puffs into the lungs every 4 (four) hours. 1 each 0  . aspirin EC 81 MG tablet Take 81 mg by mouth daily.    Marland Kitchen atorvastatin (LIPITOR) 40 MG tablet TAKE 1 TABLET BY MOUTH EVERY DAY 90 tablet 1  . Blood Glucose Monitoring Suppl (ONE TOUCH ULTRA MINI) w/Device KIT USE AS DIRECTED 1 each 0  . buPROPion (WELLBUTRIN XL) 150 MG 24 hr tablet TAKE 2 TABLETS BY MOUTH EVERY DAY 60 tablet 11  . cetirizine (ZYRTEC) 10 MG tablet Take 10 mg by mouth daily.    . Cinnamon 500 MG capsule Take 1,000 mg by mouth daily.    . Cyanocobalamin (VITAMIN B 12 PO) Take by mouth.    . EPINEPHrine 0.3 mg/0.3 mL IJ SOAJ injection Inject 0.3 mg into the  muscle once. Bee stings    . esomeprazole (NEXIUM) 40 MG capsule Take 1 capsule (40 mg total) by mouth 2 (two) times daily before a meal. 60 capsule 3  . furosemide (LASIX) 20 MG tablet Take 20 mg by mouth 2 (two) times daily.     Marland Kitchen gabapentin (NEURONTIN) 600 MG tablet Take 300-1,200 mg by mouth 3 (three) times daily as needed.     Marland Kitchen JANUVIA 100 MG tablet TAKE 1 TABLET (100 MG TOTAL) BY MOUTH DAILY. 30 tablet 3  . metFORMIN (GLUCOPHAGE) 1000 MG tablet TAKE 1 TABLET (1,000 MG TOTAL) BY MOUTH 2 (TWO) TIMES DAILY WITH A MEAL. 180 tablet 3  . metoprolol succinate (TOPROL-XL) 50 MG 24 hr tablet TAKE 1 TABLET BY MOUTH DAILY. TAKE WITH OR IMMEDIATELY FOLLOWING A MEAL 90 tablet 3  . Misc. Devices (HUGO ROLLING WALKER) MISC Pt is requesting a walker with wheels and a seat( any kind her insurance will cover is fine) Dx. 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 ONE TOUCH ULTRA TEST test strip  USE TO TEST BLOOD SUGAR AS DIRECTED 100 each 4  . ONETOUCH DELICA LANCETS 78M MISC USE TO OBTAIN BLOOD SPECIMEN 100 each 0  . ONETOUCH DELICA LANCETS 76H MISC USE AS DIRECTED 200 each 1  . sitaGLIPtin (JANUVIA) 100 MG tablet Take 1 tablet (100 mg total) by mouth daily. 30 tablet 3   No current facility-administered medications on file prior to visit.   Allergies  Allergen Reactions  . Bee Pollen Anaphylaxis    BEE STINGS  . Naproxen Sodium Other (See Comments)    All pain meds, has fatty liver  . Reglan [Metoclopramide] Hives   Social History   Social History  . Marital Status: Married    Spouse Name: N/A  . Number of Children: 3  . Years of Education: N/A   Occupational History  . Disabled    Social History Main Topics  . Smoking status: Former Research scientist (life sciences)  . Smokeless tobacco: Not on file  . Alcohol Use: No  . Drug Use: No  . Sexual Activity: Not on file   Other Topics Concern  . Not on file   Social History Narrative     Review of Systems  All  other systems reviewed and are negative.      Objective:   Physical Exam  Constitutional: She appears well-developed and well-nourished.  Cardiovascular: Normal rate, regular rhythm and normal heart sounds.   Pulmonary/Chest: Effort normal and breath sounds normal.  Skin: Rash noted.  Vitals reviewed.         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 Begin Valtrex 1000 mg by mouth twice a day for 3 days. Send viral culture to confirm.

## 2015-11-26 LAB — REFLEX ADENOVIRUS CULTURE

## 2015-11-26 LAB — RFX HSV/VARICELLA ZOSTER RAPID CULT

## 2015-11-27 LAB — CYTOMEGALOVIRUS CULTURE

## 2015-11-27 LAB — VIRAL CULTURE VIRC

## 2015-11-28 ENCOUNTER — Other Ambulatory Visit: Payer: Self-pay | Admitting: Family Medicine

## 2015-11-28 DIAGNOSIS — Z1231 Encounter for screening mammogram for malignant neoplasm of breast: Secondary | ICD-10-CM

## 2015-12-05 ENCOUNTER — Other Ambulatory Visit: Payer: Self-pay | Admitting: Family Medicine

## 2015-12-05 DIAGNOSIS — I10 Essential (primary) hypertension: Secondary | ICD-10-CM | POA: Diagnosis not present

## 2015-12-05 DIAGNOSIS — E119 Type 2 diabetes mellitus without complications: Secondary | ICD-10-CM | POA: Diagnosis not present

## 2015-12-05 DIAGNOSIS — G932 Benign intracranial hypertension: Secondary | ICD-10-CM | POA: Diagnosis not present

## 2015-12-05 DIAGNOSIS — H2513 Age-related nuclear cataract, bilateral: Secondary | ICD-10-CM | POA: Diagnosis not present

## 2015-12-05 MED ORDER — BUPROPION HCL ER (XL) 150 MG PO TB24: 300.0000 mg | ORAL_TABLET | Freq: Every day | ORAL | 11 refills | Status: DC

## 2015-12-29 ENCOUNTER — Other Ambulatory Visit: Payer: Medicare Other

## 2016-01-01 ENCOUNTER — Encounter: Payer: Medicare Other | Admitting: Family Medicine

## 2016-01-04 ENCOUNTER — Ambulatory Visit
Admission: RE | Admit: 2016-01-04 | Discharge: 2016-01-04 | Disposition: A | Discharge status: Home / Self Care | Payer: Medicare Other | Source: Ambulatory Visit | Attending: Family Medicine | Admitting: Family Medicine

## 2016-01-04 DIAGNOSIS — Z1231 Encounter for screening mammogram for malignant neoplasm of breast: Secondary | ICD-10-CM | POA: Diagnosis not present

## 2016-01-10 DIAGNOSIS — M47812 Spondylosis without myelopathy or radiculopathy, cervical region: Secondary | ICD-10-CM | POA: Diagnosis not present

## 2016-01-10 DIAGNOSIS — G43101 Migraine with aura, not intractable, with status migrainosus: Secondary | ICD-10-CM | POA: Diagnosis not present

## 2016-01-10 DIAGNOSIS — M792 Neuralgia and neuritis, unspecified: Secondary | ICD-10-CM | POA: Diagnosis not present

## 2016-01-29 ENCOUNTER — Other Ambulatory Visit: Payer: Medicare Other

## 2016-01-29 DIAGNOSIS — Z0000 Encounter for general adult medical examination without abnormal findings: Secondary | ICD-10-CM | POA: Diagnosis not present

## 2016-01-29 DIAGNOSIS — E785 Hyperlipidemia, unspecified: Secondary | ICD-10-CM | POA: Diagnosis not present

## 2016-01-29 DIAGNOSIS — E1143 Type 2 diabetes mellitus with diabetic autonomic (poly)neuropathy: Secondary | ICD-10-CM | POA: Diagnosis not present

## 2016-01-29 DIAGNOSIS — I10 Essential (primary) hypertension: Secondary | ICD-10-CM

## 2016-01-29 LAB — CBC WITH DIFFERENTIAL/PLATELET
BASOS ABS: 0 {cells}/uL (ref 0–200)
Basophils Relative: 0 %
EOS ABS: 142 {cells}/uL (ref 15–500)
Eosinophils Relative: 2 %
HEMATOCRIT: 40 % (ref 35.0–45.0)
Hemoglobin: 13.3 g/dL (ref 12.0–15.0)
LYMPHS PCT: 43 %
Lymphs Abs: 3053 cells/uL (ref 850–3900)
MCH: 29.8 pg (ref 27.0–33.0)
MCHC: 33.3 g/dL (ref 32.0–36.0)
MCV: 89.5 fL (ref 80.0–100.0)
MONO ABS: 426 {cells}/uL (ref 200–950)
MPV: 10.1 fL (ref 7.5–12.5)
Monocytes Relative: 6 %
NEUTROS PCT: 49 %
Neutro Abs: 3479 cells/uL (ref 1500–7800)
Platelets: 334 10*3/uL (ref 140–400)
RBC: 4.47 MIL/uL (ref 3.80–5.10)
RDW: 13.3 % (ref 11.0–15.0)
WBC: 7.1 10*3/uL (ref 3.8–10.8)

## 2016-01-29 LAB — COMPREHENSIVE METABOLIC PANEL
ALK PHOS: 62 U/L (ref 33–115)
ALT: 82 U/L — ABNORMAL HIGH (ref 6–29)
AST: 68 U/L — ABNORMAL HIGH (ref 10–35)
Albumin: 4.5 g/dL (ref 3.6–5.1)
BILIRUBIN TOTAL: 0.3 mg/dL (ref 0.2–1.2)
BUN: 13 mg/dL (ref 7–25)
CALCIUM: 9.8 mg/dL (ref 8.6–10.2)
CO2: 28 mmol/L (ref 20–31)
CREATININE: 0.7 mg/dL (ref 0.50–1.10)
Chloride: 101 mmol/L (ref 98–110)
GLUCOSE: 105 mg/dL — AB (ref 70–99)
Potassium: 4.9 mmol/L (ref 3.5–5.3)
Sodium: 142 mmol/L (ref 135–146)
Total Protein: 7 g/dL (ref 6.1–8.1)

## 2016-01-29 LAB — LIPID PANEL
Cholesterol: 179 mg/dL (ref 125–200)
HDL: 46 mg/dL (ref >=46)
LDL Cholesterol: 95 mg/dL (ref <130)
TRIGLYCERIDES: 189 mg/dL — AB (ref <150)
Total CHOL/HDL Ratio: 3.9 Ratio (ref <=5.0)
VLDL: 38 mg/dL — ABNORMAL HIGH (ref <30)

## 2016-01-30 LAB — HEMOGLOBIN A1C
HEMOGLOBIN A1C: 6.2 % — AB (ref <5.7)
MEAN PLASMA GLUCOSE: 131 mg/dL

## 2016-02-02 ENCOUNTER — Ambulatory Visit (INDEPENDENT_AMBULATORY_CARE_PROVIDER_SITE_OTHER): Payer: Medicare Other | Admitting: Family Medicine

## 2016-02-02 ENCOUNTER — Encounter: Payer: Self-pay | Admitting: Family Medicine

## 2016-02-02 VITALS — BP 128/74 | HR 88 | Temp 97.9°F | Resp 18 | Ht 62.0 in | Wt 189.0 lb

## 2016-02-02 DIAGNOSIS — I10 Essential (primary) hypertension: Secondary | ICD-10-CM

## 2016-02-02 DIAGNOSIS — K760 Fatty (change of) liver, not elsewhere classified: Secondary | ICD-10-CM

## 2016-02-02 DIAGNOSIS — E785 Hyperlipidemia, unspecified: Secondary | ICD-10-CM | POA: Diagnosis not present

## 2016-02-02 DIAGNOSIS — Z23 Encounter for immunization: Secondary | ICD-10-CM | POA: Diagnosis not present

## 2016-02-02 DIAGNOSIS — E1143 Type 2 diabetes mellitus with diabetic autonomic (poly)neuropathy: Secondary | ICD-10-CM

## 2016-02-02 DIAGNOSIS — Z0000 Encounter for general adult medical examination without abnormal findings: Secondary | ICD-10-CM | POA: Diagnosis not present

## 2016-02-02 NOTE — Progress Notes (Signed)
Subjective:    Patient ID: Jacqueline [name], female    DOB: Nov 16, 1965, 50 y.o.   MRN: [id number]  HPI Patient is here today for a complete physical exam. Her past medical history significant for a hysterectomy. Therefore she does not require Pap smear. The patient had a colonoscopy performed in 2011 which was completely normal. Therefore she is not due for a colonoscopy.  Lab on 01/29/2016  Component Date Value Ref Range Status  . WBC 01/29/2016 7.1  3.8 - 10.8 K/uL Final  . RBC 01/29/2016 4.47  3.80 - 5.10 MIL/uL Final  . Hemoglobin 01/29/2016 13.3  12.0 - 15.0 g/dL Final  . HCT 01/29/2016 40.0  35.0 - 45.0 % Final  . MCV 01/29/2016 89.5  80.0 - 100.0 fL Final  . MCH 01/29/2016 29.8  27.0 - 33.0 pg Final  . MCHC 01/29/2016 33.3  32.0 - 36.0 g/dL Final  . RDW 01/29/2016 13.3  11.0 - 15.0 % Final  . Platelets 01/29/2016 334  140 - 400 K/uL Final  . MPV 01/29/2016 10.1  7.5 - 12.5 fL Final  . Neutro Abs 01/29/2016 3479  1,500 - 7,800 cells/uL Final  . Lymphs Abs 01/29/2016 3053  850 - 3,900 cells/uL Final  . Monocytes Absolute 01/29/2016 426  200 - 950 cells/uL Final  . Eosinophils Absolute 01/29/2016 142  15 - 500 cells/uL Final  . Basophils Absolute 01/29/2016 0  0 - 200 cells/uL Final  . Neutrophils Relative % 01/29/2016 49  % Final  . Lymphocytes Relative 01/29/2016 43  % Final  . Monocytes Relative 01/29/2016 6  % Final  . Eosinophils Relative 01/29/2016 2  % Final  . Basophils Relative 01/29/2016 0  % Final  . Smear Review 01/29/2016 Criteria for review not met   Final  . Sodium 01/29/2016 142  135 - 146 mmol/L Final  . Potassium 01/29/2016 4.9  3.5 - 5.3 mmol/L Final  . Chloride 01/29/2016 101  98 - 110 mmol/L Final  . CO2 01/29/2016 28  20 - 31 mmol/L Final  . Glucose, Bld 01/29/2016 105* 70 - 99 mg/dL Final  . BUN 01/29/2016 13  7 - 25 mg/dL Final  . Creat 01/29/2016 0.70  0.50 - 1.10 mg/dL Final  . Total Bilirubin 01/29/2016 0.3  0.2 - 1.2 mg/dL Final  . Alkaline  Phosphatase 01/29/2016 62  33 - 115 U/L Final  . AST 01/29/2016 68* 10 - 35 U/L Final  . ALT 01/29/2016 82* 6 - 29 U/L Final  . Total Protein 01/29/2016 7.0  6.1 - 8.1 g/dL Final  . Albumin 01/29/2016 4.5  3.6 - 5.1 g/dL Final  . Calcium 01/29/2016 9.8  8.6 - 10.2 mg/dL Final  . Hgb A1c MFr Bld 01/30/2016 6.2* <5.7 % Final   Comment:   For someone without known diabetes, a hemoglobin A1c value between 5.7% and 6.4% is consistent with prediabetes and should be confirmed with a follow-up test.   For someone with known diabetes, a value <7% indicates that their diabetes is well controlled. A1c targets should be individualized based on duration of diabetes, age, co-morbid conditions and other considerations.   This assay result is consistent with an increased risk of diabetes.   Currently, no consensus exists regarding use of hemoglobin A1c for diagnosis of diabetes in children.     . Mean Plasma Glucose 01/30/2016 131  mg/dL Final  . Cholesterol 01/29/2016 179  125 - 200 mg/dL Final  . Triglycerides 01/29/2016 189* <150 mg/dL Final  .  HDL 01/29/2016 46  >=46 mg/dL Final  . Total CHOL/HDL Ratio 01/29/2016 3.9  <=5.0 Ratio Final  . VLDL 01/29/2016 38* <30 mg/dL Final  . LDL Cholesterol 01/29/2016 95  <130 mg/dL Final   Comment:   Total Cholesterol/HDL Ratio:CHD Risk                        Coronary Heart Disease Risk Table                                        Men       Women          1/2 Average Risk              3.4        3.3              Average Risk              5.0        4.4           2X Average Risk              9.6        7.1           3X Average Risk             23.4       11.0 Use the calculated Patient Ratio above and the CHD Risk table  to determine the patient's CHD Risk.   Orders Only on 11/20/2015  Component Date Value Ref Range Status  . Cytomegalovirus Culture 11/27/2015 REPORT   Final   Comment: Cytomegalovirus Rapid Culture SOURCE : Not Provided RESULT                          We are unable to report a result for this culture due to the presence of another virus. This virus isolate was confirmed by another test system. This test was developed and its analytical performance characteristics have been determined by [employer], [place], [place]. It has not been cleared or approved by the U.S. Food and Drug Administration. This assay has been validated pursuant to the CLIA regulations and is used for clinical purposes.   . Source 11/27/2015 Not Provided   Final  . HSV Zoster Virus Rapid Culture 11/26/2015 REPORT*  Final   Comment: Herpes simplex/Varicella zoster virus Rapid Cult SOURCE : Not Provided Herpes simplex virus         ** Herpes simplex virus isolated. CRITICAL VALUE REPORT Varicella zoster virus          Unable to report a result for                           Varicella zoster virus                           culture due to the presence                           of another virus.   . Adenovirus Culture 11/26/2015 REPORT   Final   Comment: Adenovirus Culture SOURCE : Not Provided RESULT  We are unable to report a result for this culture due to the presence of another virus. This virus isolate was confirmed by another test system. Adenovirus types 25 and 41 may not be isolated from culture. This test was developed and its analytical performance characteristics have been determined by [employer] [facility], [place], [place]. It has not been cleared or approved by the U.S. Food and Drug Administration. This assay has been validated pursuant to the CLIA regulations and is used for clinical purposes.   Office Visit on 11/20/2015  Component Date Value Ref Range Status  . Viral Culture 11/27/2015 REPORT   Final   Comment: Viral Rapid Cultures            Have been added Enterovirus Culture SOURCE : Not Provided RESULT                          Not Isolated Reference range:   Not Isolated The current Enterovirus culture system does not detect Enterovirus D68. If Enterovirus D68 is suspected, please call client services to add Enterovirus RNA, Qualitative, Real-Time PCR (test code [id number]).   . Source 11/27/2015 Not Provided   Final   Past Medical History:  Diagnosis Date  . Abnormal transaminases   . Anemia   . Anxiety disorder   . Arthritis   . Asthma   . Cellulitis   . Chronic headaches   . Diverticulitis   . DM (diabetes mellitus) ([place])   . Erosive esophagitis   . Fatty liver   . GERD (gastroesophageal reflux disease)   . IBS (irritable bowel syndrome)   . Kidney stones   . Obesity   . Pneumonia   . Pseudotumor cerebri   . Sinus tachycardia ([place])    Past Surgical History:  Procedure Laterality Date  . ANKLE SURGERY    . COLONOSCOPY  06/19/2009  . EGD  06/09/2009  . FOOT SURGERY    . NASAL SEPTUM SURGERY    . ovarian cysts     x3  . TONSILLECTOMY    . TUBAL LIGATION    . VAGINAL HYSTERECTOMY     Current Outpatient Prescriptions on File Prior to Visit  Medication Sig Dispense Refill  . Albuterol Sulfate (PROAIR RESPICLICK) 833 (90 BASE) MCG/ACT AEPB Inhale 2 puffs into the lungs every 4 (four) hours. 1 each 0  . aspirin EC 81 MG tablet Take 81 mg by mouth daily.    Marland Kitchen atorvastatin (LIPITOR) 40 MG tablet TAKE 1 TABLET BY MOUTH EVERY DAY 90 tablet 1  . Blood Glucose Monitoring Suppl (ONE TOUCH ULTRA MINI) w/Device KIT USE AS DIRECTED 1 each 0  . buPROPion (WELLBUTRIN XL) 150 MG 24 hr tablet Take 2 tablets (300 mg total) by mouth daily. 180 tablet 11  . cetirizine (ZYRTEC) 10 MG tablet Take 10 mg by mouth daily.    . Cinnamon 500 MG capsule Take 1,000 mg by mouth daily.    . Cyanocobalamin (VITAMIN B 12 PO) Take by mouth.    . EPINEPHrine 0.3 mg/0.3 mL IJ SOAJ injection Inject 0.3 mg into the muscle once. Bee stings    . esomeprazole (NEXIUM) 40 MG capsule Take 1 capsule (40 mg total) by mouth 2 (two) times daily before a meal. 60 capsule 3    . furosemide (LASIX) 20 MG tablet Take 20 mg by mouth 2 (two) times daily.     Marland Kitchen gabapentin (NEURONTIN) 600 MG tablet Take 300-1,200 mg by mouth 3 (  three) times daily as needed.     Marland Kitchen JANUVIA 100 MG tablet TAKE 1 TABLET (100 MG TOTAL) BY MOUTH DAILY. 30 tablet 3  . metFORMIN (GLUCOPHAGE) 1000 MG tablet TAKE 1 TABLET (1,000 MG TOTAL) BY MOUTH 2 (TWO) TIMES DAILY WITH A MEAL. 180 tablet 3  . metoprolol succinate (TOPROL-XL) 50 MG 24 hr tablet TAKE 1 TABLET BY MOUTH DAILY. TAKE WITH OR IMMEDIATELY FOLLOWING A MEAL 90 tablet 3  . Misc. Devices (HUGO ROLLING WALKER) MISC Pt is requesting a walker with wheels and a seat( any kind her insurance will cover is fine) Dx. 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 ONE TOUCH ULTRA TEST test strip USE TO TEST BLOOD SUGAR AS DIRECTED 100 each 4  . ONETOUCH DELICA LANCETS 47S MISC USE TO OBTAIN BLOOD SPECIMEN 100 each 0  . ONETOUCH DELICA LANCETS 96G MISC USE AS DIRECTED 200 each 1  . sitaGLIPtin (JANUVIA) 100 MG tablet Take 1 tablet (100 mg total) by mouth daily. 30 tablet 3  . valACYclovir (VALTREX) 1000 MG tablet Take 1 tablet (1,000 mg total) by mouth 2 (two) times daily. 6 tablet 0   No current facility-administered medications on file prior to visit.    Allergies  Allergen Reactions  . Bee Pollen Anaphylaxis    BEE STINGS  . Naproxen Sodium Other (See Comments)    All pain meds, has fatty liver  . Reglan [Metoclopramide] Hives   Social History   Social History  . Marital status: Married    Spouse name: N/A  . Number of children: 3  . Years of education: N/A   Occupational History  . Disabled    Social History Main Topics  . Smoking status: Former Research scientist (life sciences)  . Smokeless tobacco: Not on file  . Alcohol use No  . Drug use: No  . Sexual activity: Not on file   Other Topics Concern  . Not on file   Social History Narrative  . No narrative on file   Family History  Problem Relation Age of  Onset  . Colitis    . Crohn's disease    . Breast cancer    . Ovarian cancer    . Celiac disease    . Clotting disorder    . Cystic fibrosis      pat. cousin  . Diabetes Mother   . Heart disease    . Irritable bowel syndrome    . Kidney failure  31    mat. nephew  . Colon cancer Neg Hx   . Diabetes Maternal Aunt   . Diabetes Son   . Diabetes      mat. nephew      Review of Systems  All other systems reviewed and are negative.      Objective:   Physical Exam  Constitutional: She is oriented to person, place, and time. She appears well-developed and well-nourished. No distress.  HENT:  Head: Normocephalic and atraumatic.  Right Ear: External ear normal.  Left Ear: External ear normal.  Nose: Nose normal.  Mouth/Throat: Oropharynx is clear and moist. No oropharyngeal exudate.  Eyes: Conjunctivae and EOM are normal. Pupils are equal, round, and reactive to light. Right eye exhibits no discharge. Left eye exhibits no discharge. No scleral icterus.  Neck: Normal range of motion. Neck supple. No JVD present. No thyromegaly present.  Cardiovascular: Normal rate, regular rhythm, normal heart sounds and intact distal pulses.  Exam reveals no gallop and no friction rub.   No murmur heard. Pulmonary/Chest: Effort normal and breath sounds normal. No stridor. No respiratory distress. She has no wheezes. She has no rales. She exhibits no tenderness.  Abdominal: Soft. Bowel sounds are normal. She exhibits no distension and no mass. There is no tenderness. There is no rebound and no guarding.  Musculoskeletal: Normal range of motion. She exhibits no edema or tenderness.  Lymphadenopathy:    She has no cervical adenopathy.  Neurological: She is alert and oriented to person, place, and time. She has normal reflexes. No cranial nerve deficit. She exhibits normal muscle tone. Coordination normal.  Skin: Skin is warm. No rash noted. She is not diaphoretic. No erythema. No pallor.    Psychiatric: She has a normal mood and affect. Her behavior is normal. Judgment and thought content normal.  Vitals reviewed.         Assessment & Plan:  Routine general medical examination at a health care facility  Benign essential HTN  Fatty liver  Type 2 diabetes mellitus with diabetic autonomic neuropathy, without long-term current use of insulin (HCC)  HLD (hyperlipidemia)   Patient's physical exam is normal except for obesity. I recommended diet exercise and weight loss, particularly given her history of fatty liver disease.  I recommended we discontinued Januvia and replace it with Victoza which may help her lose weight and ultimately also help her fatty liver disease. She is interested in doing this in 3 months when she has used up all of her Januvia.  She received her flu shot today. Cancer screening is up-to-date. Immunizations are up-to-date. Blood pressures well controlled. Cholesterol is acceptable. She continues to have persistent elevations in her liver function tests due to fatty liver disease but I'm hoping the change in her diabetic medication may assist with this. Diabetic foot exam is performed today and is normal. She is compliant with an aspirin. Diabetic eye exam is performed annually

## 2016-02-02 NOTE — Addendum Note (Signed)
Addended by: Shary Decamp B on: 02/02/2016 11:51 AM   Modules accepted: Orders

## 2016-03-04 ENCOUNTER — Telehealth: Payer: Self-pay | Admitting: Family Medicine

## 2016-03-04 NOTE — Telephone Encounter (Signed)
Pt called and states that she is finished with the Januvia and would like to know which med you want her to start that you had mentioned Victoza and Trulicity?????

## 2016-03-05 MED ORDER — LIRAGLUTIDE 18 MG/3ML ~~LOC~~ SOPN: 1.2000 mg | PEN_INJECTOR | Freq: Every day | SUBCUTANEOUS | 3 refills | Status: DC

## 2016-03-05 MED ORDER — INSULIN PEN NEEDLE 31G X 8 MM MISC: 5 refills | Status: DC

## 2016-03-05 NOTE — Telephone Encounter (Signed)
Victoza 1.2 mg sq daily

## 2016-03-05 NOTE — Telephone Encounter (Signed)
Pt aware and med sent to [facility]

## 2016-03-14 ENCOUNTER — Other Ambulatory Visit: Payer: Self-pay | Admitting: Family Medicine

## 2016-04-20 ENCOUNTER — Other Ambulatory Visit: Payer: Self-pay | Admitting: Family Medicine

## 2016-04-24 ENCOUNTER — Other Ambulatory Visit: Payer: Self-pay | Admitting: Family Medicine

## 2016-05-03 ENCOUNTER — Telehealth: Payer: Self-pay | Admitting: Family Medicine

## 2016-05-03 NOTE — Telephone Encounter (Signed)
Patient called requesting sample of victoza I advised patient we didn't have any at this time but I would route to nurse in case we got any in before the 28th.  CB# [phone]

## 2016-05-09 ENCOUNTER — Other Ambulatory Visit: Payer: Self-pay | Admitting: Unknown Physician Specialty

## 2016-05-09 DIAGNOSIS — G5781 Other specified mononeuropathies of right lower limb: Secondary | ICD-10-CM | POA: Diagnosis not present

## 2016-05-09 DIAGNOSIS — M79604 Pain in right leg: Secondary | ICD-10-CM | POA: Diagnosis not present

## 2016-05-09 DIAGNOSIS — M792 Neuralgia and neuritis, unspecified: Secondary | ICD-10-CM | POA: Diagnosis not present

## 2016-05-09 DIAGNOSIS — Z9181 History of falling: Secondary | ICD-10-CM | POA: Diagnosis not present

## 2016-05-10 ENCOUNTER — Ambulatory Visit
Admission: RE | Admit: 2016-05-10 | Discharge: 2016-05-10 | Disposition: A | Discharge status: Home / Self Care | Payer: Medicare Other | Source: Ambulatory Visit | Attending: Unknown Physician Specialty | Admitting: Unknown Physician Specialty

## 2016-05-10 DIAGNOSIS — M79604 Pain in right leg: Secondary | ICD-10-CM

## 2016-05-10 DIAGNOSIS — M5126 Other intervertebral disc displacement, lumbar region: Secondary | ICD-10-CM | POA: Diagnosis not present

## 2016-05-15 NOTE — Telephone Encounter (Signed)
Sample in fridge and pt aware to pick up

## 2016-05-20 ENCOUNTER — Other Ambulatory Visit: Payer: Medicare Other

## 2016-06-15 ENCOUNTER — Ambulatory Visit (HOSPITAL_COMMUNITY)
Admission: EM | Admit: 2016-06-15 | Discharge: 2016-06-15 | Disposition: A | Discharge status: Home / Self Care | Payer: Medicare Other | Attending: Family Medicine | Admitting: Family Medicine

## 2016-06-15 ENCOUNTER — Encounter (HOSPITAL_COMMUNITY): Payer: Self-pay | Admitting: Family Medicine

## 2016-06-15 DIAGNOSIS — J0100 Acute maxillary sinusitis, unspecified: Secondary | ICD-10-CM | POA: Diagnosis not present

## 2016-06-15 MED ORDER — BENZONATATE 100 MG PO CAPS: 100.0000 mg | ORAL_CAPSULE | Freq: Three times a day (TID) | ORAL | 0 refills | Status: DC

## 2016-06-15 MED ORDER — AMOXICILLIN-POT CLAVULANATE 875-125 MG PO TABS: 1.0000 | ORAL_TABLET | Freq: Two times a day (BID) | ORAL | 0 refills | Status: AC

## 2016-06-15 NOTE — Discharge Instructions (Signed)
You have acute sinusitis. I have prescribed an antibiotic. Take Augmentin 1 tablet twice a day. I have also prescribed Tessalon for cough. Take 1 tablet every 8 hours as needed. You my day Tylenol every 4 hours as needed for fever or Ibuprofen every 6. Should your symptoms fail to improve follow up with your primary care provider or return to clinic.

## 2016-06-15 NOTE — ED Triage Notes (Signed)
Pt here for fever at home, right facial pain with drainage. Lymph nodes enlarged.

## 2016-06-15 NOTE — ED Provider Notes (Signed)
CSN: [id number]     Arrival date & time 06/15/16  1304 History   First MD Initiated Contact with Patient 06/15/16 1523     Chief Complaint  Patient presents with  . Fever  . Sore Throat  . Lymphadenopathy   (Consider location/radiation/quality/duration/timing/severity/associated sxs/prior Treatment) 51 year old female presents with 2 week history of congestion, cough, sore throat and swollen lymph nodes. She denies any dental pain, reports when she blows her nose the sputum is blood tinged. Her cough is dry, and non productive. Fever of 101 last night, has taken tylenol at home   The history is provided by the patient.  Fever  Sore Throat     Past Medical History:  Diagnosis Date  . Abnormal transaminases   . Anemia   . Anxiety disorder   . Arthritis   . Asthma   . Cellulitis   . Chronic headaches   . Diverticulitis   . DM (diabetes mellitus) ([place])   . Erosive esophagitis   . Fatty liver   . GERD (gastroesophageal reflux disease)   . IBS (irritable bowel syndrome)   . Kidney stones   . Obesity   . Pneumonia   . Pseudotumor cerebri   . Sinus tachycardia    Past Surgical History:  Procedure Laterality Date  . ANKLE SURGERY    . COLONOSCOPY  06/19/2009  . EGD  06/09/2009  . FOOT SURGERY    . NASAL SEPTUM SURGERY    . ovarian cysts     x3  . TONSILLECTOMY    . TUBAL LIGATION    . VAGINAL HYSTERECTOMY     Family History  Problem Relation Age of Onset  . Colitis    . Crohn's disease    . Breast cancer    . Ovarian cancer    . Celiac disease    . Clotting disorder    . Cystic fibrosis      pat. cousin  . Diabetes Mother   . Heart disease    . Irritable bowel syndrome    . Kidney failure  31    mat. nephew  . Diabetes Maternal Aunt   . Diabetes Son   . Diabetes      mat. nephew  . Colon cancer Neg Hx    Social History  Substance Use Topics  . Smoking status: Former Research scientist (life sciences)  . Smokeless tobacco: Never Used  . Alcohol use No   OB History    No data  available     Review of Systems  Reason unable to perform ROS: as covered in HPI.  Constitutional: Positive for fever.  All other systems reviewed and are negative.   Allergies  Bee pollen; Naproxen sodium; and Reglan [metoclopramide]  Home Medications   Prior to Admission medications   Medication Sig Start Date End Date Taking? Authorizing Provider  DULoxetine (CYMBALTA) 60 MG capsule Take 60 mg by mouth daily.   Yes Historical Provider, MD  oxcarbazepine (TRILEPTAL) 600 MG tablet Take 600 mg by mouth 2 (two) times daily.   Yes Historical Provider, MD  Albuterol Sulfate (PROAIR RESPICLICK) 962 (90 BASE) MCG/ACT AEPB Inhale 2 puffs into the lungs every 4 (four) hours. 03/15/15   Alycia Rossetti, MD  amoxicillin-clavulanate (AUGMENTIN) 875-125 MG tablet Take 1 tablet by mouth 2 (two) times daily. 06/15/16 06/22/16  Barnet Glasgow, NP  aspirin EC 81 MG tablet Take 81 mg by mouth daily.    Historical Provider, MD  atorvastatin (LIPITOR) 40 MG tablet TAKE  1 TABLET BY MOUTH EVERY DAY 04/24/16   Susy Frizzle, MD  benzonatate (TESSALON) 100 MG capsule Take 1 capsule (100 mg total) by mouth every 8 (eight) hours. 06/15/16   Barnet Glasgow, NP  Blood Glucose Monitoring Suppl (ONE TOUCH ULTRA MINI) w/Device KIT USE AS DIRECTED 11/06/15   Susy Frizzle, MD  buPROPion (WELLBUTRIN XL) 150 MG 24 hr tablet Take 2 tablets (300 mg total) by mouth daily. 12/05/15   Susy Frizzle, MD  cetirizine (ZYRTEC) 10 MG tablet Take 10 mg by mouth daily.    Historical Provider, MD  Cinnamon 500 MG capsule Take 1,000 mg by mouth daily.    Historical Provider, MD  Cyanocobalamin (VITAMIN B 12 PO) Take by mouth.    Historical Provider, MD  EPINEPHrine 0.3 mg/0.3 mL IJ SOAJ injection Inject 0.3 mg into the muscle once. Bee stings 09/15/12   Susy Frizzle, MD  esomeprazole (NEXIUM) 40 MG capsule Take 1 capsule (40 mg total) by mouth 2 (two) times daily before a meal. 01/18/14   Susy Frizzle, MD  furosemide  (LASIX) 20 MG tablet Take 20 mg by mouth 2 (two) times daily.     Historical Provider, MD  gabapentin (NEURONTIN) 600 MG tablet Take 300-1,200 mg by mouth 3 (three) times daily as needed.     Historical Provider, MD  Insulin Pen Needle 31G X 8 MM MISC Use daily with victoza pen 03/05/16   Susy Frizzle, MD  liraglutide 18 MG/3ML SOPN Inject 0.2 mLs (1.2 mg total) into the skin daily. 03/05/16   Susy Frizzle, MD  metFORMIN (GLUCOPHAGE) 1000 MG tablet TAKE 1 TABLET (1,000 MG TOTAL) BY MOUTH 2 (TWO) TIMES DAILY WITH A MEAL. 03/14/16   Susy Frizzle, MD  metoprolol succinate (TOPROL-XL) 50 MG 24 hr tablet TAKE 1 TABLET BY MOUTH DAILY WITH OR IMMEDIATELYFOLLOWING A MEAL 04/22/16   Susy Frizzle, MD  Misc. Devices (Wheeler) MISC Pt is requesting a walker with wheels and a seat( any kind her insurance will cover is fine) Dx. 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 11/20/15   Susy Frizzle, MD   Meds Ordered and Administered this Visit  Medications - No data to display  BP 122/86   Pulse (!) 128   Temp 98.7 F (37.1 C)   Resp 18   SpO2 96%  No data found.   Physical Exam  Constitutional: She is oriented to person, place, and time. She appears well-developed and well-nourished. She does not have a sickly appearance. She does not appear ill. No distress.  HENT:  Head: Normocephalic and atraumatic.  Right Ear: Tympanic membrane and external ear normal.  Left Ear: Tympanic membrane and external ear normal.  Nose: Mucosal edema and  rhinorrhea present. Right sinus exhibits no maxillary sinus tenderness and no frontal sinus tenderness. Left sinus exhibits maxillary sinus tenderness and frontal sinus tenderness.  Mouth/Throat: Uvula is midline and oropharynx is clear and moist. No  oropharyngeal exudate.  Eyes: Pupils are equal, round, and reactive to light.  Neck: Normal range of motion. Neck supple. No JVD present.  Cardiovascular: Normal rate and regular rhythm.   Pulmonary/Chest: Effort normal and breath sounds normal. No respiratory distress. She has no wheezes.  Abdominal: Soft. Bowel sounds are normal. She exhibits no distension. There is no tenderness. There is no guarding.  Lymphadenopathy:       Head (right side): Submandibular and tonsillar adenopathy present.    She has no cervical adenopathy.  Neurological: She is alert and oriented to person, place, and time.  Skin: Skin is warm and dry. Capillary refill takes less than 2 seconds. She is not diaphoretic.  Psychiatric: She has a normal mood and affect.  Nursing note and vitals reviewed.   Urgent Care Course     Procedures (including critical care time)  Labs Review Labs Reviewed - No data to display  Imaging Review No results found.   Visual Acuity Review  Right Eye Distance:   Left Eye Distance:   Bilateral Distance:    Right Eye Near:   Left Eye Near:    Bilateral Near:         MDM   1. Acute non-recurrent maxillary sinusitis   You have acute sinusitis. I have prescribed an antibiotic. Take Augmentin 1 tablet twice a day. I have also prescribed Tessalon for cough. Take 1 tablet every 8 hours as needed. You my day Tylenol every 4 hours as needed for fever or Ibuprofen every 6. Should your symptoms fail to improve follow up with your primary care provider or return to clinic.      Barnet Glasgow, NP 06/15/16 1536

## 2016-07-04 ENCOUNTER — Other Ambulatory Visit: Payer: Self-pay | Admitting: Family Medicine

## 2016-07-04 MED ORDER — INSULIN PEN NEEDLE 31G X 8 MM MISC: 5 refills | Status: DC

## 2016-07-09 ENCOUNTER — Telehealth: Payer: Self-pay | Admitting: Family Medicine

## 2016-07-09 MED ORDER — INSULIN PEN NEEDLE 31G X 8 MM MISC: 5 refills | Status: DC

## 2016-07-09 NOTE — Telephone Encounter (Signed)
Resent rx with dx code in different place on rx - pt aware and will call if further problems

## 2016-07-09 NOTE — Telephone Encounter (Signed)
Pt came in wanting to speak with you, I let her know you were busy with another pt. She cannot get her victoza pen needles because it did not have a diagnosis code.

## 2016-07-26 ENCOUNTER — Other Ambulatory Visit: Payer: Self-pay | Admitting: Family Medicine

## 2016-08-01 DIAGNOSIS — G43101 Migraine with aura, not intractable, with status migrainosus: Secondary | ICD-10-CM | POA: Diagnosis not present

## 2016-08-01 DIAGNOSIS — M47812 Spondylosis without myelopathy or radiculopathy, cervical region: Secondary | ICD-10-CM | POA: Diagnosis not present

## 2016-08-01 DIAGNOSIS — E138 Other specified diabetes mellitus with unspecified complications: Secondary | ICD-10-CM | POA: Diagnosis not present

## 2016-08-01 DIAGNOSIS — G5781 Other specified mononeuropathies of right lower limb: Secondary | ICD-10-CM | POA: Diagnosis not present

## 2016-08-01 DIAGNOSIS — M792 Neuralgia and neuritis, unspecified: Secondary | ICD-10-CM | POA: Diagnosis not present

## 2016-10-03 ENCOUNTER — Telehealth: Payer: Self-pay | Admitting: Family Medicine

## 2016-10-03 DIAGNOSIS — R238 Other skin changes: Secondary | ICD-10-CM

## 2016-10-03 MED ORDER — VALACYCLOVIR HCL 1 G PO TABS: 1000.0000 mg | ORAL_TABLET | Freq: Two times a day (BID) | ORAL | 0 refills | Status: DC

## 2016-10-03 NOTE — Telephone Encounter (Signed)
NTBS.

## 2016-10-03 NOTE — Telephone Encounter (Signed)
Pt's legs are broke out again and she is requesting a refill on Valtrex - ok to refill or does she NTBS?

## 2016-10-03 NOTE — Telephone Encounter (Signed)
Pt states that Dr. Dennard Schaumann said when it returns she will need the valtrex again and she did have a positive culture - med sent to pharm.

## 2016-10-18 ENCOUNTER — Other Ambulatory Visit: Payer: Self-pay | Admitting: Family Medicine

## 2016-12-25 ENCOUNTER — Other Ambulatory Visit: Payer: Self-pay | Admitting: Family Medicine

## 2017-01-21 ENCOUNTER — Ambulatory Visit (INDEPENDENT_AMBULATORY_CARE_PROVIDER_SITE_OTHER): Payer: Medicare Other | Admitting: Family Medicine

## 2017-01-21 ENCOUNTER — Encounter: Payer: Self-pay | Admitting: Family Medicine

## 2017-01-21 VITALS — BP 128/84 | HR 100 | Temp 97.9°F | Resp 18 | Ht 62.0 in | Wt 189.0 lb

## 2017-01-21 DIAGNOSIS — B9689 Other specified bacterial agents as the cause of diseases classified elsewhere: Secondary | ICD-10-CM | POA: Diagnosis not present

## 2017-01-21 DIAGNOSIS — J019 Acute sinusitis, unspecified: Secondary | ICD-10-CM

## 2017-01-21 MED ORDER — AMOXICILLIN-POT CLAVULANATE 875-125 MG PO TABS: 1.0000 | ORAL_TABLET | Freq: Two times a day (BID) | ORAL | 0 refills | Status: DC

## 2017-01-21 NOTE — Progress Notes (Signed)
Subjective:    Patient ID: Jacqueline [name], female    DOB: July 20, 1965, 51 y.o.   MRN: [id number]  HPI Symptoms began 7-10 days ago. Symptoms consist of head congestion, pain and pressure behind both eyes and in her forehead. She is now developing pain and pressure in her maxillary sinuses. She reports fever, severe bilateral ear pain, postnasal drip. She reports a constant headache. She reports fatigue. Symptoms are gradually worsening. Now she has developed a cough due to pND. Past Medical History:  Diagnosis Date  . Abnormal transaminases   . Anemia   . Anxiety disorder   . Arthritis   . Asthma   . Cellulitis   . Chronic headaches   . Diverticulitis   . DM (diabetes mellitus) ([place])   . Erosive esophagitis   . Fatty liver   . GERD (gastroesophageal reflux disease)   . IBS (irritable bowel syndrome)   . Kidney stones   . Obesity   . Pneumonia   . Pseudotumor cerebri   . Sinus tachycardia    Past Surgical History:  Procedure Laterality Date  . ANKLE SURGERY    . COLONOSCOPY  06/19/2009  . EGD  06/09/2009  . FOOT SURGERY    . NASAL SEPTUM SURGERY    . ovarian cysts     x3  . TONSILLECTOMY    . TUBAL LIGATION    . VAGINAL HYSTERECTOMY     Current Outpatient Prescriptions on File Prior to Visit  Medication Sig Dispense Refill  . aspirin EC 81 MG tablet Take 81 mg by mouth daily.    Marland Kitchen atorvastatin (LIPITOR) 40 MG tablet TAKE 1 TABLET BY MOUTH EVERY DAY 90 tablet 1  . Blood Glucose Monitoring Suppl (ONE TOUCH ULTRA MINI) w/Device KIT USE AS DIRECTED 1 each 0  . cetirizine (ZYRTEC) 10 MG tablet Take 10 mg by mouth daily.    . Cinnamon 500 MG capsule Take 1,000 mg by mouth daily.    . Cyanocobalamin (VITAMIN B 12 PO) Take by mouth.    . DULoxetine (CYMBALTA) 60 MG capsule Take 60 mg by mouth daily.    Marland Kitchen EPINEPHrine 0.3 mg/0.3 mL IJ SOAJ injection Inject 0.3 mg into the muscle once. Bee stings    . esomeprazole (NEXIUM) 40 MG capsule Take 1 capsule (40 mg total) by mouth 2  (two) times daily before a meal. 60 capsule 3  . furosemide (LASIX) 20 MG tablet Take 20 mg by mouth 2 (two) times daily.     Marland Kitchen gabapentin (NEURONTIN) 600 MG tablet Take 300-1,200 mg by mouth 3 (three) times daily as needed.     . metFORMIN (GLUCOPHAGE) 1000 MG tablet TAKE 1 TABLET (1,000 MG TOTAL) BY MOUTH 2 (TWO) TIMES DAILY WITH A MEAL. 180 tablet 3  . metoprolol succinate (TOPROL-XL) 50 MG 24 hr tablet TAKE 1 TABLET BY MOUTH DAILY WITH OR IMMEDIATELYFOLLOWING A MEAL 90 tablet 3  . ONE TOUCH ULTRA TEST test strip USE TO TEST BLOOD SUGAR AS DIRECTED 100 each 4  . ONETOUCH DELICA LANCETS 70Y MISC USE TO OBTAIN BLOOD SPECIMEN 100 each 0  . ONETOUCH DELICA LANCETS 17C MISC USE AS DIRECTED 200 each 1  . ONETOUCH DELICA LANCETS 94W MISC USE AS DIRECTED 200 each 1  . oxcarbazepine (TRILEPTAL) 600 MG tablet Take 600 mg by mouth 2 (two) times daily.    . valACYclovir (VALTREX) 1000 MG tablet Take 1 tablet (1,000 mg total) by mouth 2 (two) times daily. (Patient not taking: Reported  on 01/21/2017) 6 tablet 0   No current facility-administered medications on file prior to visit.    Allergies  Allergen Reactions  . Bee Pollen Anaphylaxis    BEE STINGS  . Naproxen Sodium Other (See Comments)    All pain meds, has fatty liver  . Reglan [Metoclopramide] Hives   Social History   Social History  . Marital status: Married    Spouse name: N/A  . Number of children: 3  . Years of education: N/A   Occupational History  . Disabled    Social History Main Topics  . Smoking status: Former Research scientist (life sciences)  . Smokeless tobacco: Never Used  . Alcohol use No  . Drug use: No  . Sexual activity: Not on file   Other Topics Concern  . Not on file   Social History Narrative  . No narrative on file      Review of Systems  All other systems reviewed and are negative.      Objective:   Physical Exam  Constitutional: She appears well-developed and well-nourished. No distress.  HENT:  Right Ear: External  ear normal. Tympanic membrane is erythematous and bulging. A middle ear effusion is present.  Left Ear: Tympanic membrane and external ear normal.  Nose: Mucosal edema and rhinorrhea present. Right sinus exhibits maxillary sinus tenderness and frontal sinus tenderness. Left sinus exhibits maxillary sinus tenderness and frontal sinus tenderness.  Mouth/Throat: Oropharynx is clear and moist. No oropharyngeal exudate.  Eyes: Pupils are equal, round, and reactive to light. Conjunctivae are normal.  Neck: Neck supple.  Cardiovascular: Normal rate, regular rhythm and normal heart sounds.  Exam reveals no gallop and no friction rub.   No murmur heard. Pulmonary/Chest: Effort normal and breath sounds normal. No respiratory distress. She has no wheezes. She has no rales. She exhibits no tenderness.  Lymphadenopathy:    She has no cervical adenopathy.  Skin: She is not diaphoretic.  Vitals reviewed.         Assessment & Plan:  Acute bacterial rhinosinusitis - Plan: amoxicillin-clavulanate (AUGMENTIN) 875-125 MG tablet  Given the duration of symptoms, I'll start the patient on Augmentin 875 mg by mouth twice a day for 10 days. She can add Sudafed for head congestion. She can also do nasal saline 4 times a day. Recheck in one week if no better or sooner if worse

## 2017-01-28 ENCOUNTER — Ambulatory Visit (INDEPENDENT_AMBULATORY_CARE_PROVIDER_SITE_OTHER): Payer: Medicare Other | Admitting: Family Medicine

## 2017-01-28 ENCOUNTER — Encounter: Payer: Self-pay | Admitting: Family Medicine

## 2017-01-28 VITALS — BP 118/80 | HR 80 | Temp 98.0°F | Resp 16 | Ht 62.5 in | Wt 191.0 lb

## 2017-01-28 DIAGNOSIS — B9689 Other specified bacterial agents as the cause of diseases classified elsewhere: Secondary | ICD-10-CM | POA: Diagnosis not present

## 2017-01-28 DIAGNOSIS — J019 Acute sinusitis, unspecified: Secondary | ICD-10-CM | POA: Diagnosis not present

## 2017-01-28 MED ORDER — PREDNISONE 20 MG PO TABS: ORAL_TABLET | ORAL | 0 refills | Status: DC

## 2017-01-28 MED ORDER — CEFDINIR 300 MG PO CAPS: 300.0000 mg | ORAL_CAPSULE | Freq: Two times a day (BID) | ORAL | 0 refills | Status: DC

## 2017-01-28 MED ORDER — HYDROCODONE-HOMATROPINE 5-1.5 MG/5ML PO SYRP: 5.0000 mL | ORAL_SOLUTION | Freq: Three times a day (TID) | ORAL | 0 refills | Status: DC | PRN

## 2017-01-28 NOTE — Progress Notes (Signed)
Subjective:    Patient ID: Jacqueline [name], female    DOB: 1965/12/13, 51 y.o.   MRN: [id number]  HPI  01/21/17 Symptoms began 7-10 days ago. Symptoms consist of head congestion, pain and pressure behind both eyes and in her forehead. She is now developing pain and pressure in her maxillary sinuses. She reports fever, severe bilateral ear pain, postnasal drip. She reports a constant headache. She reports fatigue. Symptoms are gradually worsening. Now she has developed a cough due to pND.  At that time, my plan was: Given the duration of symptoms, I'll start the patient on Augmentin 875 mg by mouth twice a day for 10 days. She can add Sudafed for head congestion. She can also do nasal saline 4 times a day. Recheck in one week if no better or sooner if worse.  01/28/17 Patient states symptoms are getting worse. She reports worsening head congestion. She reports worsening pressure behind her eyes and in her face. She reports persistent postnasal drip. She reports postnasal drip causing cough, chest congestion, worsening cough with expiratory wheezing. She denies any fever. She does report worsening headaches despite being on the Augmentin Past Medical History:  Diagnosis Date  . Abnormal transaminases   . Anemia   . Anxiety disorder   . Arthritis   . Asthma   . Cellulitis   . Chronic headaches   . Diverticulitis   . DM (diabetes mellitus) ([place])   . Erosive esophagitis   . Fatty liver   . GERD (gastroesophageal reflux disease)   . IBS (irritable bowel syndrome)   . Kidney stones   . Obesity   . Pneumonia   . Pseudotumor cerebri   . Sinus tachycardia    Past Surgical History:  Procedure Laterality Date  . ANKLE SURGERY    . COLONOSCOPY  06/19/2009  . EGD  06/09/2009  . FOOT SURGERY    . NASAL SEPTUM SURGERY    . ovarian cysts     x3  . TONSILLECTOMY    . TUBAL LIGATION    . VAGINAL HYSTERECTOMY     Current Outpatient Prescriptions on File Prior to Visit  Medication Sig Dispense  Refill  . amoxicillin-clavulanate (AUGMENTIN) 875-125 MG tablet Take 1 tablet by mouth 2 (two) times daily. 20 tablet 0  . aspirin EC 81 MG tablet Take 81 mg by mouth daily.    Marland Kitchen atorvastatin (LIPITOR) 40 MG tablet TAKE 1 TABLET BY MOUTH EVERY DAY 90 tablet 1  . Blood Glucose Monitoring Suppl (ONE TOUCH ULTRA MINI) w/Device KIT USE AS DIRECTED 1 each 0  . cetirizine (ZYRTEC) 10 MG tablet Take 10 mg by mouth daily.    . Cinnamon 500 MG capsule Take 1,000 mg by mouth daily.    . Cyanocobalamin (VITAMIN B 12 PO) Take by mouth.    . DULoxetine (CYMBALTA) 60 MG capsule Take 60 mg by mouth daily.    Marland Kitchen EPINEPHrine 0.3 mg/0.3 mL IJ SOAJ injection Inject 0.3 mg into the muscle once. Bee stings    . esomeprazole (NEXIUM) 40 MG capsule Take 1 capsule (40 mg total) by mouth 2 (two) times daily before a meal. 60 capsule 3  . furosemide (LASIX) 20 MG tablet Take 20 mg by mouth 2 (two) times daily.     Marland Kitchen gabapentin (NEURONTIN) 600 MG tablet Take 300-1,200 mg by mouth 3 (three) times daily as needed.     . metFORMIN (GLUCOPHAGE) 1000 MG tablet TAKE 1 TABLET (1,000 MG TOTAL) BY MOUTH 2 (  TWO) TIMES DAILY WITH A MEAL. 180 tablet 3  . metoprolol succinate (TOPROL-XL) 50 MG 24 hr tablet TAKE 1 TABLET BY MOUTH DAILY WITH OR IMMEDIATELYFOLLOWING A MEAL 90 tablet 3  . ONE TOUCH ULTRA TEST test strip USE TO TEST BLOOD SUGAR AS DIRECTED 100 each 4  . ONETOUCH DELICA LANCETS 37H MISC USE TO OBTAIN BLOOD SPECIMEN 100 each 0  . ONETOUCH DELICA LANCETS 43E MISC USE AS DIRECTED 200 each 1  . ONETOUCH DELICA LANCETS 76D MISC USE AS DIRECTED 200 each 1  . oxcarbazepine (TRILEPTAL) 600 MG tablet Take 600 mg by mouth 2 (two) times daily.    . valACYclovir (VALTREX) 1000 MG tablet Take 1 tablet (1,000 mg total) by mouth 2 (two) times daily. 6 tablet 0   No current facility-administered medications on file prior to visit.    Allergies  Allergen Reactions  . Bee Pollen Anaphylaxis    BEE STINGS  . Naproxen Sodium Other (See  Comments)    All pain meds, has fatty liver  . Reglan [Metoclopramide] Hives   Social History   Social History  . Marital status: Married    Spouse name: N/A  . Number of children: 3  . Years of education: N/A   Occupational History  . Disabled    Social History Main Topics  . Smoking status: Former Research scientist (life sciences)  . Smokeless tobacco: Never Used  . Alcohol use No  . Drug use: No  . Sexual activity: Not on file   Other Topics Concern  . Not on file   Social History Narrative  . No narrative on file      Review of Systems  All other systems reviewed and are negative.      Objective:   Physical Exam  Constitutional: She appears well-developed and well-nourished. No distress.  HENT:  Right Ear: External ear normal. Tympanic membrane is erythematous and bulging. A middle ear effusion is present.  Left Ear: Tympanic membrane and external ear normal.  Nose: Mucosal edema and rhinorrhea present. Right sinus exhibits maxillary sinus tenderness and frontal sinus tenderness. Left sinus exhibits maxillary sinus tenderness and frontal sinus tenderness.  Mouth/Throat: Oropharynx is clear and moist. No oropharyngeal exudate.  Eyes: Pupils are equal, round, and reactive to light. Conjunctivae are normal.  Neck: Neck supple.  Cardiovascular: Normal rate, regular rhythm and normal heart sounds.  Exam reveals no gallop and no friction rub.   No murmur heard. Pulmonary/Chest: Effort normal and breath sounds normal. No respiratory distress. She has no wheezes. She has no rales. She exhibits no tenderness.  Lymphadenopathy:    She has no cervical adenopathy.  Skin: She is not diaphoretic.  Vitals reviewed.         Assessment & Plan:  Acute rhinosinusitis Discontinue Augmentin and replaced with Omnicef 300 mg by mouth twice a day for 10 days. Supplement with oral prednisone taper pack to decrease swelling in the sinus passages. Use Hycodan 1 teaspoon every 6-8 hours as needed for  cough.

## 2017-01-29 ENCOUNTER — Other Ambulatory Visit: Payer: Self-pay | Admitting: Family Medicine

## 2017-03-08 ENCOUNTER — Other Ambulatory Visit: Payer: Self-pay | Admitting: Family Medicine

## 2017-04-09 ENCOUNTER — Other Ambulatory Visit: Payer: Self-pay | Admitting: Family Medicine

## 2017-04-24 ENCOUNTER — Other Ambulatory Visit: Payer: Medicare Other

## 2017-04-29 ENCOUNTER — Ambulatory Visit (INDEPENDENT_AMBULATORY_CARE_PROVIDER_SITE_OTHER): Payer: Medicare Other | Admitting: Family Medicine

## 2017-04-29 ENCOUNTER — Encounter: Payer: Self-pay | Admitting: Family Medicine

## 2017-04-29 VITALS — BP 130/88 | HR 80 | Temp 98.6°F | Resp 18 | Ht 62.0 in | Wt 192.0 lb

## 2017-04-29 DIAGNOSIS — K760 Fatty (change of) liver, not elsewhere classified: Secondary | ICD-10-CM | POA: Diagnosis not present

## 2017-04-29 DIAGNOSIS — Z0000 Encounter for general adult medical examination without abnormal findings: Secondary | ICD-10-CM | POA: Diagnosis not present

## 2017-04-29 DIAGNOSIS — E1143 Type 2 diabetes mellitus with diabetic autonomic (poly)neuropathy: Secondary | ICD-10-CM

## 2017-04-29 DIAGNOSIS — Z23 Encounter for immunization: Secondary | ICD-10-CM | POA: Diagnosis not present

## 2017-04-29 DIAGNOSIS — E1169 Type 2 diabetes mellitus with other specified complication: Secondary | ICD-10-CM

## 2017-04-29 DIAGNOSIS — E785 Hyperlipidemia, unspecified: Secondary | ICD-10-CM | POA: Diagnosis not present

## 2017-04-29 DIAGNOSIS — I10 Essential (primary) hypertension: Secondary | ICD-10-CM | POA: Diagnosis not present

## 2017-04-29 NOTE — Addendum Note (Signed)
Addended by: Shary Decamp B on: 04/29/2017 10:48 AM   Modules accepted: Orders

## 2017-04-29 NOTE — Progress Notes (Signed)
Subjective:    Patient ID: Jacqueline [name], female    DOB: Sep 29, 1965, 51 y.o.   MRN: [id number]  HPI Patient is here today for a complete physical exam.  She has not had her mammogram yet this year.  I have recommended that she get her mammogram as soon as possible.  She does not require Pap smear as she has a history of a total abdominal hysterectomy with BSO.  She is already had a colonoscopy due to irritable bowel syndrome.  She does not require repeat colonoscopy until after age 4.  Her immunization records are included below.  She is overdue for diabetic eye exam as well as a diabetic foot exam.  Diabetic foot exam is performed today Immunization History  Administered Date(s) Administered  . Influenza,inj,Quad PF,6+ Mos 02/02/2016  . Pneumococcal Polysaccharide-23 10/24/2014  . Tdap 12/17/2011   She is due today for a flu shot.  She denies any chest pain shortness of breath or dyspnea on exertion.  She denies any myalgias or right upper quadrant pain.  She denies any polyuria, polydipsia, or blurry vision. Past Medical History:  Diagnosis Date  . Abnormal transaminases   . Anemia   . Anxiety disorder   . Arthritis   . Asthma   . Cellulitis   . Chronic headaches   . Diverticulitis   . DM (diabetes mellitus) ([place])   . Erosive esophagitis   . Fatty liver   . GERD (gastroesophageal reflux disease)   . IBS (irritable bowel syndrome)   . Kidney stones   . Obesity   . Pneumonia   . Pseudotumor cerebri   . Sinus tachycardia    Past Surgical History:  Procedure Laterality Date  . ANKLE SURGERY    . COLONOSCOPY  06/19/2009  . EGD  06/09/2009  . FOOT SURGERY    . NASAL SEPTUM SURGERY    . ovarian cysts     x3  . TONSILLECTOMY    . TUBAL LIGATION    . VAGINAL HYSTERECTOMY     Current Outpatient Medications on File Prior to Visit  Medication Sig Dispense Refill  . aspirin EC 81 MG tablet Take 81 mg by mouth daily.    Marland Kitchen atorvastatin (LIPITOR) 40 MG tablet TAKE 1 TABLET BY  MOUTH EVERY DAY 90 tablet 0  . Blood Glucose Monitoring Suppl (ONE TOUCH ULTRA MINI) w/Device KIT USE AS DIRECTED 1 each 0  . cetirizine (ZYRTEC) 10 MG tablet Take 10 mg by mouth daily.    . Cinnamon 500 MG capsule Take 1,000 mg by mouth daily.    . Cyanocobalamin (VITAMIN B 12 PO) Take by mouth.    . DULoxetine (CYMBALTA) 60 MG capsule Take 60 mg by mouth daily.    Marland Kitchen EPINEPHrine 0.3 mg/0.3 mL IJ SOAJ injection Inject 0.3 mg into the muscle once. Bee stings    . esomeprazole (NEXIUM) 40 MG capsule Take 1 capsule (40 mg total) by mouth 2 (two) times daily before a meal. 60 capsule 3  . furosemide (LASIX) 20 MG tablet Take 20 mg by mouth 2 (two) times daily.     Marland Kitchen gabapentin (NEURONTIN) 600 MG tablet Take 300-1,200 mg by mouth 3 (three) times daily as needed.     . metFORMIN (GLUCOPHAGE) 1000 MG tablet TAKE 1 TABLET (1,000 MG TOTAL) BY MOUTH 2 (TWO) TIMES DAILY WITH A MEAL. 180 tablet 3  . metoprolol succinate (TOPROL-XL) 50 MG 24 hr tablet TAKE 1 TABLET BY MOUTH DAILY WITH OR IMMEDIATELY  FOLLOWING A MEAL 90 tablet 2  . ONE TOUCH ULTRA TEST test strip USE TO TEST BLOOD SUGAR AS DIRECTED 100 each 4  . ONETOUCH DELICA LANCETS 31V MISC USE TO OBTAIN BLOOD SPECIMEN 100 each 0  . oxcarbazepine (TRILEPTAL) 600 MG tablet Take 600 mg by mouth 2 (two) times daily.    . valACYclovir (VALTREX) 1000 MG tablet Take 1 tablet (1,000 mg total) by mouth 2 (two) times daily. 6 tablet 0   No current facility-administered medications on file prior to visit.    Allergies  Allergen Reactions  . Bee Pollen Anaphylaxis    BEE STINGS  . Naproxen Sodium Other (See Comments)    All pain meds, has fatty liver  . Reglan [Metoclopramide] Hives   Social History   Socioeconomic History  . Marital status: Married    Spouse name: Not on file  . Number of children: 3  . Years of education: Not on file  . Highest education level: Not on file  Social Needs  . Financial resource strain: Not on file  . Food insecurity -  worry: Not on file  . Food insecurity - inability: Not on file  . Transportation needs - medical: Not on file  . Transportation needs - non-medical: Not on file  Occupational History  . Occupation: Disabled  Tobacco Use  . Smoking status: Former Research scientist (life sciences)  . Smokeless tobacco: Never Used  Substance and Sexual Activity  . Alcohol use: No  . Drug use: No  . Sexual activity: Not on file  Other Topics Concern  . Not on file  Social History Narrative  . Not on file   Family History  Problem Relation Age of Onset  . Colitis Unknown   . Crohn's disease Unknown   . Breast cancer Unknown   . Ovarian cancer Unknown   . Celiac disease Unknown   . Clotting disorder Unknown   . Cystic fibrosis Unknown        pat. cousin  . Diabetes Mother   . Heart disease Unknown   . Irritable bowel syndrome Unknown   . Kidney failure Unknown 31       mat. nephew  . Diabetes Maternal Aunt   . Diabetes Son   . Diabetes Unknown        mat. nephew  . Colon cancer Neg Hx       Review of Systems  All other systems reviewed and are negative.      Objective:   Physical Exam  Constitutional: She is oriented to person, place, and time. She appears well-developed and well-nourished. No distress.  HENT:  Head: Normocephalic and atraumatic.  Right Ear: External ear normal.  Left Ear: External ear normal.  Nose: Nose normal.  Mouth/Throat: Oropharynx is clear and moist. No oropharyngeal exudate.  Eyes: Conjunctivae and EOM are normal. Pupils are equal, round, and reactive to light. Right eye exhibits no discharge. Left eye exhibits no discharge. No scleral icterus.  Neck: Normal range of motion. Neck supple. No JVD present. No thyromegaly present.  Cardiovascular: Normal rate, regular rhythm, normal heart sounds and intact distal pulses. Exam reveals no gallop and no friction rub.  No murmur heard. Pulmonary/Chest: Effort normal and breath sounds normal. No stridor. No respiratory distress. She has no  wheezes. She has no rales. She exhibits no tenderness.  Abdominal: Soft. Bowel sounds are normal. She exhibits no distension and no mass. There is no tenderness. There is no rebound and no guarding.  Musculoskeletal: Normal range  of motion. She exhibits no edema or tenderness.  Lymphadenopathy:    She has no cervical adenopathy.  Neurological: She is alert and oriented to person, place, and time. She has normal reflexes. No cranial nerve deficit. She exhibits normal muscle tone. Coordination normal.  Skin: Skin is warm. No rash noted. She is not diaphoretic. No erythema. No pallor.  Psychiatric: She has a normal mood and affect. Her behavior is normal. Judgment and thought content normal.  Vitals reviewed.         Assessment & Plan:  Routine general medical examination at a health care facility  Benign essential HTN  Fatty liver  Dyslipidemia associated with type 2 diabetes mellitus ([place])  Type 2 diabetes mellitus with diabetic autonomic neuropathy, without long-term current use of insulin ([place]) - Plan: CBC with Differential/Platelet, COMPLETE METABOLIC PANEL WITH GFR, Lipid panel, Microalbumin, urine, Hemoglobin A1c   Patient's physical exam is normal except for obesity. I recommended diet exercise and weight loss, particularly given her history of fatty liver disease.  I will check a CBC today, CMP, fasting lipid panel, urine microalbumin, and hemoglobin A1c.  Goal hemoglobin A1c is less than 6.5.  Goal LDL cholesterol is less than 100.  Patient's blood pressure today is at goal.  I recommended she schedule an annual diabetic eye exam.  I recommended she schedule a mammogram.  Her Pap smear is not required due to her history of a hysterectomy.  Colonoscopy is not yet due.  Patient received her flu shot today.

## 2017-04-30 LAB — CBC WITH DIFFERENTIAL/PLATELET
BASOS ABS: 32 {cells}/uL (ref 0–200)
Basophils Relative: 0.4 %
EOS PCT: 1.5 %
Eosinophils Absolute: 120 cells/uL (ref 15–500)
HEMATOCRIT: 41.4 % (ref 35.0–45.0)
HEMOGLOBIN: 14.2 g/dL (ref 11.7–15.5)
LYMPHS ABS: 3152 {cells}/uL (ref 850–3900)
MCH: 30.4 pg (ref 27.0–33.0)
MCHC: 34.3 g/dL (ref 32.0–36.0)
MCV: 88.7 fL (ref 80.0–100.0)
MPV: 10.5 fL (ref 7.5–12.5)
Monocytes Relative: 6.1 %
NEUTROS ABS: 4208 {cells}/uL (ref 1500–7800)
Neutrophils Relative %: 52.6 %
Platelets: 342 10*3/uL (ref 140–400)
RBC: 4.67 10*6/uL (ref 3.80–5.10)
RDW: 12.5 % (ref 11.0–15.0)
Total Lymphocyte: 39.4 %
WBC: 8 10*3/uL (ref 3.8–10.8)
WBCMIX: 488 {cells}/uL (ref 200–950)

## 2017-04-30 LAB — COMPLETE METABOLIC PANEL WITH GFR
AG Ratio: 1.9 (calc) (ref 1.0–2.5)
ALBUMIN MSPROF: 5 g/dL (ref 3.6–5.1)
ALT: 89 U/L — ABNORMAL HIGH (ref 6–29)
AST: 94 U/L — ABNORMAL HIGH (ref 10–35)
Alkaline phosphatase (APISO): 80 U/L (ref 33–115)
BUN: 12 mg/dL (ref 7–25)
CALCIUM: 10.1 mg/dL (ref 8.6–10.2)
CO2: 26 mmol/L (ref 20–32)
CREATININE: 0.76 mg/dL (ref 0.50–1.10)
Chloride: 98 mmol/L (ref 98–110)
GFR, EST AFRICAN AMERICAN: 108 mL/min/{1.73_m2} (ref >=60)
GFR, EST NON AFRICAN AMERICAN: 93 mL/min/{1.73_m2} (ref >=60)
Globulin: 2.7 g/dL (calc) (ref 1.9–3.7)
Glucose, Bld: 123 mg/dL — ABNORMAL HIGH (ref 65–99)
Potassium: 4.2 mmol/L (ref 3.5–5.3)
Sodium: 140 mmol/L (ref 135–146)
Total Bilirubin: 0.3 mg/dL (ref 0.2–1.2)
Total Protein: 7.7 g/dL (ref 6.1–8.1)

## 2017-04-30 LAB — LIPID PANEL
CHOL/HDL RATIO: 5.1 (calc) — AB (ref <5.0)
CHOLESTEROL: 233 mg/dL — AB (ref <200)
HDL: 46 mg/dL — ABNORMAL LOW (ref >50)
NON-HDL CHOLESTEROL (CALC): 187 mg/dL — AB (ref <130)
Triglycerides: 416 mg/dL — ABNORMAL HIGH (ref <150)

## 2017-04-30 LAB — MICROALBUMIN, URINE: MICROALB UR: 3.2 mg/dL

## 2017-04-30 LAB — HEMOGLOBIN A1C
EAG (MMOL/L): 8.4 (calc)
HEMOGLOBIN A1C: 6.9 %{Hb} — AB (ref <5.7)
MEAN PLASMA GLUCOSE: 151 (calc)

## 2017-05-02 ENCOUNTER — Other Ambulatory Visit: Payer: Self-pay | Admitting: Family Medicine

## 2017-05-02 MED ORDER — ATORVASTATIN CALCIUM 40 MG PO TABS: 40.0000 mg | ORAL_TABLET | Freq: Every day | ORAL | 1 refills | Status: DC

## 2017-05-02 MED ORDER — LIRAGLUTIDE 18 MG/3ML ~~LOC~~ SOPN: 1.2000 mg | PEN_INJECTOR | Freq: Every day | SUBCUTANEOUS | 3 refills | Status: DC

## 2017-05-02 MED ORDER — INSULIN PEN NEEDLE 31G X 8 MM MISC: 5 refills | Status: DC

## 2017-05-08 ENCOUNTER — Other Ambulatory Visit: Payer: Self-pay | Admitting: Family Medicine

## 2017-05-08 MED ORDER — GLUCOSE BLOOD VI STRP: ORAL_STRIP | 4 refills | Status: DC

## 2017-05-12 ENCOUNTER — Encounter (HOSPITAL_COMMUNITY): Payer: Self-pay | Admitting: Family Medicine

## 2017-05-12 ENCOUNTER — Ambulatory Visit (HOSPITAL_COMMUNITY)
Admission: EM | Admit: 2017-05-12 | Discharge: 2017-05-12 | Disposition: A | Discharge status: Home / Self Care | Payer: Medicare Other | Attending: Family Medicine | Admitting: Family Medicine

## 2017-05-12 DIAGNOSIS — R6889 Other general symptoms and signs: Secondary | ICD-10-CM

## 2017-05-12 DIAGNOSIS — J069 Acute upper respiratory infection, unspecified: Secondary | ICD-10-CM

## 2017-05-12 NOTE — ED Provider Notes (Signed)
[facility]   [id number] 05/12/17 Arrival Time: 4098  ASSESSMENT & PLAN:  1. Flu-like symptoms   2. Acute upper respiratory infection    Discussed typical symptoms and duration of symptoms from viral illnesses. OTC symptom care as needed. Has f/u with her PCP on Jan 3. Will keep.  Reviewed expectations re: course of current medical issues. Questions answered. Outlined signs and symptoms indicating need for more acute intervention. Patient verbalized understanding. After Visit Summary given.   SUBJECTIVE: History from: patient.  Jacqueline [name] is a 51 y.o. female who presents with complaint of nasal congestion, post-nasal drainage, and a very slight cough. Also L ear ache. Onset abrupt, approximately 4 days ago. Overall fatigued with body aches. SOB: none. Wheezing: none. Fever: yes, subjective "with low temperature too". Overall normal PO intake without n/v. Sick contacts: no. OTC treatment: None. Social History   Tobacco Use  Smoking Status Former Smoker  Smokeless Tobacco Never Used    ROS: As per HPI.   OBJECTIVE:  Vitals:   05/12/17 1129  BP: (!) 149/95  Pulse: 93  Resp: 18  Temp: 97.9 F (36.6 C)  SpO2: 96%     General appearance: alert; appears fatigued HEENT: nasal congestion; clear runny nose; throat irritation secondary to post-nasal drainage; TMs appear normal Neck: supple without LAD Lungs: clear to auscultation bilaterally; cough: absent; no respiratory distress Skin: warm and dry Psychological: alert and cooperative; normal mood and affect   Allergies  Allergen Reactions  . Bee Pollen Anaphylaxis    BEE STINGS  . Naproxen Sodium Other (See Comments)    All pain meds, has fatty liver  . Reglan [Metoclopramide] Hives    Past Medical History:  Diagnosis Date  . Abnormal transaminases   . Anemia   . Anxiety disorder   . Arthritis   . Asthma   . Cellulitis   . Chronic headaches   . Diverticulitis   . DM (diabetes mellitus)  ([place])   . Erosive esophagitis   . Fatty liver   . GERD (gastroesophageal reflux disease)   . IBS (irritable bowel syndrome)   . Kidney stones   . Obesity   . Pneumonia   . Pseudotumor cerebri   . Sinus tachycardia    Family History  Problem Relation Age of Onset  . Colitis Unknown   . Crohn's disease Unknown   . Breast cancer Unknown   . Ovarian cancer Unknown   . Celiac disease Unknown   . Clotting disorder Unknown   . Cystic fibrosis Unknown        pat. cousin  . Diabetes Mother   . Heart disease Unknown   . Irritable bowel syndrome Unknown   . Kidney failure Unknown 31       mat. nephew  . Diabetes Maternal Aunt   . Diabetes Son   . Diabetes Unknown        mat. nephew  . Colon cancer Neg Hx    Social History   Socioeconomic History  . Marital status: Married    Spouse name: Not on file  . Number of children: 3  . Years of education: Not on file  . Highest education level: Not on file  Social Needs  . Financial resource strain: Not on file  . Food insecurity - worry: Not on file  . Food insecurity - inability: Not on file  . Transportation needs - medical: Not on file  . Transportation needs - non-medical: Not on file  Occupational History  .  Occupation: Disabled  Tobacco Use  . Smoking status: Former Research scientist (life sciences)  . Smokeless tobacco: Never Used  Substance and Sexual Activity  . Alcohol use: No  . Drug use: No  . Sexual activity: Not on file  Other Topics Concern  . Not on file  Social History Narrative  . Not on file           Vanessa Kick, MD 05/12/17 1212

## 2017-05-12 NOTE — ED Triage Notes (Signed)
Pt here for sinus congestion, facial pressure and headache. Denies cough or chest congestion. sts that she has been cold. Staying hydrated. sts some nausea and cramping with diarrhea.

## 2017-05-15 ENCOUNTER — Ambulatory Visit: Payer: Medicare Other | Admitting: Family Medicine

## 2017-05-24 ENCOUNTER — Other Ambulatory Visit: Payer: Self-pay | Admitting: Family Medicine

## 2017-06-04 ENCOUNTER — Other Ambulatory Visit: Payer: Self-pay | Admitting: Family Medicine

## 2017-06-04 DIAGNOSIS — Z1231 Encounter for screening mammogram for malignant neoplasm of breast: Secondary | ICD-10-CM

## 2017-06-20 DIAGNOSIS — I10 Essential (primary) hypertension: Secondary | ICD-10-CM | POA: Diagnosis not present

## 2017-06-20 DIAGNOSIS — G932 Benign intracranial hypertension: Secondary | ICD-10-CM | POA: Diagnosis not present

## 2017-06-20 DIAGNOSIS — H2513 Age-related nuclear cataract, bilateral: Secondary | ICD-10-CM | POA: Diagnosis not present

## 2017-06-20 DIAGNOSIS — E119 Type 2 diabetes mellitus without complications: Secondary | ICD-10-CM | POA: Diagnosis not present

## 2017-06-24 ENCOUNTER — Telehealth: Payer: Self-pay | Admitting: Family Medicine

## 2017-06-24 MED ORDER — VALACYCLOVIR HCL 500 MG PO TABS: 500.0000 mg | ORAL_TABLET | Freq: Two times a day (BID) | ORAL | 0 refills | Status: DC

## 2017-06-24 NOTE — Telephone Encounter (Signed)
Med sent to pharm 

## 2017-06-24 NOTE — Telephone Encounter (Signed)
Pt called [facility] and states that those places have come back on the back of her legs and would like for [place] to refill her Valtrex - OK to refill???

## 2017-06-24 NOTE — Telephone Encounter (Signed)
Ok valtrex 500 bid for 3 days

## 2017-06-26 ENCOUNTER — Ambulatory Visit
Admission: RE | Admit: 2017-06-26 | Discharge: 2017-06-26 | Disposition: A | Discharge status: Home / Self Care | Payer: Medicare Other | Source: Ambulatory Visit | Attending: Family Medicine | Admitting: Family Medicine

## 2017-06-26 DIAGNOSIS — Z1231 Encounter for screening mammogram for malignant neoplasm of breast: Secondary | ICD-10-CM

## 2017-08-06 ENCOUNTER — Telehealth: Payer: Self-pay | Admitting: Family Medicine

## 2017-08-06 NOTE — Telephone Encounter (Signed)
Patient called [facility] stating that her Neurologist has left the practice that she was going to and in order for her to get her medications refilled she has to see someone else in that practice and is wanting to know if you will fill her 2 medications for her instead of her seeing another neuro??? Furosemide and Oxcarbazepine.

## 2017-08-07 MED ORDER — FUROSEMIDE 20 MG PO TABS: 20.0000 mg | ORAL_TABLET | Freq: Two times a day (BID) | ORAL | 0 refills | Status: DC

## 2017-08-07 MED ORDER — OXCARBAZEPINE 600 MG PO TABS: 600.0000 mg | ORAL_TABLET | Freq: Every day | ORAL | 0 refills | Status: DC

## 2017-08-07 NOTE — Telephone Encounter (Signed)
I would be willing to refill her meds.

## 2017-08-07 NOTE — Telephone Encounter (Signed)
Medication called/sent to requested pharmacy and pt aware 

## 2017-08-19 ENCOUNTER — Other Ambulatory Visit: Payer: Self-pay | Admitting: Family Medicine

## 2017-09-17 ENCOUNTER — Other Ambulatory Visit: Payer: Self-pay | Admitting: Family Medicine

## 2017-10-14 ENCOUNTER — Other Ambulatory Visit: Payer: Self-pay | Admitting: Family Medicine

## 2017-10-16 ENCOUNTER — Encounter: Payer: Self-pay | Admitting: Family Medicine

## 2017-10-16 ENCOUNTER — Ambulatory Visit (INDEPENDENT_AMBULATORY_CARE_PROVIDER_SITE_OTHER): Payer: Medicare Other | Admitting: Family Medicine

## 2017-10-16 VITALS — BP 110/76 | HR 100 | Temp 98.0°F | Resp 14 | Ht 62.0 in | Wt 182.0 lb

## 2017-10-16 DIAGNOSIS — K760 Fatty (change of) liver, not elsewhere classified: Secondary | ICD-10-CM | POA: Diagnosis not present

## 2017-10-16 DIAGNOSIS — I10 Essential (primary) hypertension: Secondary | ICD-10-CM | POA: Diagnosis not present

## 2017-10-16 DIAGNOSIS — E1169 Type 2 diabetes mellitus with other specified complication: Secondary | ICD-10-CM | POA: Diagnosis not present

## 2017-10-16 DIAGNOSIS — E1143 Type 2 diabetes mellitus with diabetic autonomic (poly)neuropathy: Secondary | ICD-10-CM | POA: Diagnosis not present

## 2017-10-16 DIAGNOSIS — E0842 Diabetes mellitus due to underlying condition with diabetic polyneuropathy: Secondary | ICD-10-CM | POA: Diagnosis not present

## 2017-10-16 DIAGNOSIS — E785 Hyperlipidemia, unspecified: Secondary | ICD-10-CM

## 2017-10-16 NOTE — Progress Notes (Signed)
Subjective:    Patient ID: Jacqueline [name], female    DOB: [birth year], 52 y.o.   MRN: [id number]  HPI  12/18 Patient is here today for a complete physical exam.  She has not had her mammogram yet this year.  I have recommended that she get her mammogram as soon as possible.  She does not require Pap smear as she has a history of a total abdominal hysterectomy with BSO.  She is already had a colonoscopy due to irritable bowel syndrome.  She does not require repeat colonoscopy until after age 14.  Her immunization records are included below.  She is overdue for diabetic eye exam as well as a diabetic foot exam.  Diabetic foot exam is performed today. She is due today for a flu shot.  She denies any chest pain shortness of breath or dyspnea on exertion.  She denies any myalgias or right upper quadrant pain.  She denies any polyuria, polydipsia, or blurry vision.  At that time, my plan was: Patient's physical exam is normal except for obesity. I recommended diet exercise and weight loss, particularly given her history of fatty liver disease.  I will check a CBC today, CMP, fasting lipid panel, urine microalbumin, and hemoglobin A1c.  Goal hemoglobin A1c is less than 6.5.  Goal LDL cholesterol is less than 100.  Patient's blood pressure today is at goal.  I recommended she schedule an annual diabetic eye exam.  I recommended she schedule a mammogram.  Her Pap smear is not required due to her history of a hysterectomy.  Colonoscopy is not yet due.  Patient received her flu shot today.  10/16/17 Since the patient's last appointment, she is lost approximately 10 pounds.  She has not changed her diet.  She attributes this mainly to Victoza.  I am very happy to see the weight loss.  I am hopeful that with weight loss, her elevated liver function tests will have improved.  Patient has a documented history of fatty liver disease.  Unfortunately she continues to eat a diet rich in fats and carbohydrates.  She states  that her blood sugars are remaining consistent around 130.  She denies any polyuria, polydipsia, or blurry vision.  Immunizations are up-to-date.  Her records state that she needs a Pap smear however the patient has a hysterectomy and therefore this is not required.  She does report some numbness only in the right foot in the webspace between the first and second toe.  I can palpate no Morton's neuroma.  10 g monofilament testing is normal.  Blood pressure today is well controlled at 110/76.  She denies any chest pain shortness of breath or dyspnea on exertion.  She denies any myalgias or right upper quadrant pain. Past Medical History:  Diagnosis Date  . Abnormal transaminases   . Anemia   . Anxiety disorder   . Arthritis   . Asthma   . Cellulitis   . Chronic headaches   . Diverticulitis   . DM (diabetes mellitus) ([place])   . Erosive esophagitis   . Fatty liver   . GERD (gastroesophageal reflux disease)   . IBS (irritable bowel syndrome)   . Kidney stones   . Obesity   . Pneumonia   . Pseudotumor cerebri   . Sinus tachycardia    Past Surgical History:  Procedure Laterality Date  . ANKLE SURGERY    . COLONOSCOPY  06/19/2009  . EGD  06/09/2009  . FOOT SURGERY    .  NASAL SEPTUM SURGERY    . ovarian cysts     x3  . TONSILLECTOMY    . TUBAL LIGATION    . VAGINAL HYSTERECTOMY     Current Outpatient Medications on File Prior to Visit  Medication Sig Dispense Refill  . aspirin EC 81 MG tablet Take 81 mg by mouth daily.    Marland Kitchen atorvastatin (LIPITOR) 40 MG tablet Take 1 tablet (40 mg total) by mouth daily. 90 tablet 1  . atorvastatin (LIPITOR) 40 MG tablet TAKE 1 TABLET BY MOUTH EVERY DAY 90 tablet 0  . Blood Glucose Monitoring Suppl (ONE TOUCH ULTRA MINI) w/Device KIT USE AS DIRECTED 1 each 0  . cetirizine (ZYRTEC) 10 MG tablet Take 10 mg by mouth daily.    . Cinnamon 500 MG capsule Take 1,000 mg by mouth daily.    . Cyanocobalamin (VITAMIN B 12 PO) Take by mouth.    . DULoxetine  (CYMBALTA) 60 MG capsule Take 60 mg by mouth daily.    Marland Kitchen EPINEPHrine 0.3 mg/0.3 mL IJ SOAJ injection Inject 0.3 mg into the muscle once. Bee stings    . esomeprazole (NEXIUM) 40 MG capsule Take 1 capsule (40 mg total) by mouth 2 (two) times daily before a meal. 60 capsule 3  . furosemide (LASIX) 20 MG tablet Take 1 tablet (20 mg total) by mouth 2 (two) times daily. 180 tablet 0  . gabapentin (NEURONTIN) 600 MG tablet Take 300-1,200 mg by mouth 3 (three) times daily as needed.     Marland Kitchen glucose blood (ONE TOUCH ULTRA TEST) test strip Check BS BID  DX:E11.9 100 each 4  . Insulin Pen Needle 31G X 8 MM MISC Use daily with victoza pen - DX:E11.9 100 each 5  . metFORMIN (GLUCOPHAGE) 1000 MG tablet TAKE 1 TABLET (1,000 MG TOTAL) BY MOUTH 2 (TWO) TIMES DAILY WITH A MEAL. 180 tablet 3  . metoprolol succinate (TOPROL-XL) 50 MG 24 hr tablet TAKE 1 TABLET BY MOUTH DAILY WITH OR IMMEDIATELY FOLLOWING A MEAL 90 tablet 2  . ONETOUCH DELICA LANCETS 58X MISC USE TO OBTAIN BLOOD SPECIMEN 100 each 0  . oxcarbazepine (TRILEPTAL) 600 MG tablet TAKE 1 TABLET(600 MG) BY MOUTH DAILY 90 tablet 0  . VICTOZA 18 MG/3ML SOPN ADMINISTER 1.2 MG UNDER THE SKIN DAILY 6 mL 3   No current facility-administered medications on file prior to visit.    Allergies  Allergen Reactions  . Bee Pollen Anaphylaxis    BEE STINGS  . Naproxen Sodium Other (See Comments)    All pain meds, has fatty liver  . Reglan [Metoclopramide] Hives   Social History   Socioeconomic History  . Marital status: Married    Spouse name: Not on file  . Number of children: 3  . Years of education: Not on file  . Highest education level: Not on file  Occupational History  . Occupation: Disabled  Social Needs  . Financial resource strain: Not on file  . Food insecurity:    Worry: Not on file    Inability: Not on file  . Transportation needs:    Medical: Not on file    Non-medical: Not on file  Tobacco Use  . Smoking status: Former Research scientist (life sciences)  .  Smokeless tobacco: Never Used  Substance and Sexual Activity  . Alcohol use: No  . Drug use: No  . Sexual activity: Not on file  Lifestyle  . Physical activity:    Days per week: Not on file    Minutes per  session: Not on file  . Stress: Not on file  Relationships  . Social connections:    Talks on phone: Not on file    Gets together: Not on file    Attends religious service: Not on file    Active member of club or organization: Not on file    Attends meetings of clubs or organizations: Not on file    Relationship status: Not on file  . Intimate partner violence:    Fear of current or ex partner: Not on file    Emotionally abused: Not on file    Physically abused: Not on file    Forced sexual activity: Not on file  Other Topics Concern  . Not on file  Social History Narrative  . Not on file   Family History  Problem Relation Age of Onset  . Colitis Unknown   . Crohn's disease Unknown   . Breast cancer Unknown   . Ovarian cancer Unknown   . Celiac disease Unknown   . Clotting disorder Unknown   . Cystic fibrosis Unknown        pat. cousin  . Diabetes Mother   . Heart disease Unknown   . Irritable bowel syndrome Unknown   . Kidney failure Unknown 31       mat. nephew  . Diabetes Maternal Aunt   . Diabetes Son   . Diabetes Unknown        mat. nephew  . Colon cancer Neg Hx       Review of Systems  All other systems reviewed and are negative.      Objective:   Physical Exam  Constitutional: She appears well-developed and well-nourished. No distress.  HENT:  Head: Normocephalic and atraumatic.  Eyes: Pupils are equal, round, and reactive to light. Conjunctivae are normal. No scleral icterus.  Neck: Normal range of motion. Neck supple. No JVD present. No thyromegaly present.  Cardiovascular: Normal rate, regular rhythm, normal heart sounds and intact distal pulses. Exam reveals no gallop and no friction rub.  No murmur heard. Pulmonary/Chest: Effort normal  and breath sounds normal. No respiratory distress. She has no wheezes. She has no rales. She exhibits no tenderness.  Abdominal: Soft. Bowel sounds are normal. She exhibits no distension and no mass. There is no tenderness. There is no rebound and no guarding.  Musculoskeletal: She exhibits no edema.  Skin: Skin is warm. No rash noted. She is not diaphoretic. No erythema. No pallor.  Psychiatric: She has a normal mood and affect.  Vitals reviewed.         Assessment & Plan:  Type 2 diabetes mellitus with diabetic autonomic neuropathy, without long-term current use of insulin ([place]) - Plan: CBC with Differential/Platelet, COMPLETE METABOLIC PANEL WITH GFR, Lipid panel, Microalbumin, urine, Hemoglobin A1c  Benign essential HTN  Fatty liver  Dyslipidemia associated with type 2 diabetes mellitus ([place])  Diabetic polyneuropathy associated with diabetes mellitus due to underlying condition ([place])  Essential hypertension, benign  I congratulated the patient on her 10 pounds of weight loss.  I will check a hemoglobin A1c.  Goal hemoglobin A1c is less than 6.5.  I will also check a fasting lipid panel.  Goal LDL cholesterol is less than 100.  I will also check a urine microalbumin.  Patient's blood pressure is outstanding.  Diabetic foot exam is normal.  Continue to encourage a low carbohydrate diet, low saturated fat diet, regular aerobic exercise, and additional weight loss to manage her fatty liver disease as  well as her dyslipidemia.

## 2017-10-17 ENCOUNTER — Encounter (INDEPENDENT_AMBULATORY_CARE_PROVIDER_SITE_OTHER): Payer: Self-pay

## 2017-10-17 LAB — CBC WITH DIFFERENTIAL/PLATELET
BASOS ABS: 37 {cells}/uL (ref 0–200)
BASOS PCT: 0.4 %
EOS ABS: 223 {cells}/uL (ref 15–500)
Eosinophils Relative: 2.4 %
HEMATOCRIT: 44 % (ref 35.0–45.0)
HEMOGLOBIN: 15.3 g/dL (ref 11.7–15.5)
LYMPHS ABS: 3571 {cells}/uL (ref 850–3900)
MCH: 30.9 pg (ref 27.0–33.0)
MCHC: 34.8 g/dL (ref 32.0–36.0)
MCV: 88.9 fL (ref 80.0–100.0)
MPV: 10.3 fL (ref 7.5–12.5)
Monocytes Relative: 5.7 %
NEUTROS ABS: 4938 {cells}/uL (ref 1500–7800)
Neutrophils Relative %: 53.1 %
Platelets: 367 10*3/uL (ref 140–400)
RBC: 4.95 10*6/uL (ref 3.80–5.10)
RDW: 12.6 % (ref 11.0–15.0)
Total Lymphocyte: 38.4 %
WBC: 9.3 10*3/uL (ref 3.8–10.8)
WBCMIX: 530 {cells}/uL (ref 200–950)

## 2017-10-17 LAB — LIPID PANEL
CHOL/HDL RATIO: 4.9 (calc) (ref <5.0)
Cholesterol: 207 mg/dL — ABNORMAL HIGH (ref <200)
HDL: 42 mg/dL — ABNORMAL LOW (ref >50)
LDL CHOLESTEROL (CALC): 118 mg/dL — AB
Non-HDL Cholesterol (Calc): 165 mg/dL (calc) — ABNORMAL HIGH (ref <130)
Triglycerides: 332 mg/dL — ABNORMAL HIGH (ref <150)

## 2017-10-17 LAB — COMPLETE METABOLIC PANEL WITH GFR
AG Ratio: 1.8 (calc) (ref 1.0–2.5)
ALBUMIN MSPROF: 5.1 g/dL (ref 3.6–5.1)
ALT: 77 U/L — ABNORMAL HIGH (ref 6–29)
AST: 72 U/L — ABNORMAL HIGH (ref 10–35)
Alkaline phosphatase (APISO): 86 U/L (ref 33–115)
BILIRUBIN TOTAL: 0.3 mg/dL (ref 0.2–1.2)
BUN: 11 mg/dL (ref 7–25)
CHLORIDE: 95 mmol/L — AB (ref 98–110)
CO2: 26 mmol/L (ref 20–32)
Calcium: 10 mg/dL (ref 8.6–10.2)
Creat: 0.76 mg/dL (ref 0.50–1.10)
GFR, EST AFRICAN AMERICAN: 107 mL/min/{1.73_m2} (ref >=60)
GFR, Est Non African American: 92 mL/min/{1.73_m2} (ref >=60)
GLOBULIN: 2.8 g/dL (ref 1.9–3.7)
Glucose, Bld: 121 mg/dL — ABNORMAL HIGH (ref 65–99)
Potassium: 4.2 mmol/L (ref 3.5–5.3)
SODIUM: 138 mmol/L (ref 135–146)
TOTAL PROTEIN: 7.9 g/dL (ref 6.1–8.1)

## 2017-10-17 LAB — HEMOGLOBIN A1C
Hgb A1c MFr Bld: 6.2 % of total Hgb — ABNORMAL HIGH (ref <5.7)
Mean Plasma Glucose: 131 (calc)
eAG (mmol/L): 7.3 (calc)

## 2017-10-17 LAB — MICROALBUMIN, URINE: MICROALB UR: 2.9 mg/dL

## 2017-10-24 ENCOUNTER — Other Ambulatory Visit: Payer: Self-pay | Admitting: Family Medicine

## 2017-11-03 ENCOUNTER — Other Ambulatory Visit: Payer: Self-pay | Admitting: Family Medicine

## 2017-12-11 ENCOUNTER — Other Ambulatory Visit: Payer: Self-pay | Admitting: Family Medicine

## 2017-12-11 MED ORDER — VALACYCLOVIR HCL 500 MG PO TABS: 500.0000 mg | ORAL_TABLET | Freq: Two times a day (BID) | ORAL | 0 refills | Status: AC

## 2018-01-02 ENCOUNTER — Telehealth: Payer: Self-pay | Admitting: Family Medicine

## 2018-01-02 NOTE — Telephone Encounter (Signed)
Pt dropped off dental clearnace form for extraction, needed signature placed directly into pickards green folder.

## 2018-01-06 NOTE — Telephone Encounter (Signed)
Form completed by Dr. Dennard Schaumann and faxed to [employer] at [phone]

## 2018-01-07 ENCOUNTER — Other Ambulatory Visit: Payer: Self-pay | Admitting: Family Medicine

## 2018-01-12 ENCOUNTER — Other Ambulatory Visit: Payer: Self-pay | Admitting: Family Medicine

## 2018-01-13 ENCOUNTER — Other Ambulatory Visit: Payer: Self-pay | Admitting: Family Medicine

## 2018-01-14 ENCOUNTER — Other Ambulatory Visit: Payer: Self-pay | Admitting: Family Medicine

## 2018-01-24 ENCOUNTER — Other Ambulatory Visit: Payer: Self-pay | Admitting: Family Medicine

## 2018-01-29 ENCOUNTER — Telehealth: Payer: Self-pay | Admitting: Family Medicine

## 2018-01-29 NOTE — Telephone Encounter (Signed)
Patient called in stating that she needs are referral for a neurologist. She stated that she used to see Dr.Mieden but no longer has a practice. 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 Please advise?

## 2018-01-30 NOTE — Telephone Encounter (Signed)
Be glad to see her to discuss, not sure what else can be done for her other than gabapentin.  However, if she insists I am fine with referral to Dr. Leta Baptist

## 2018-01-30 NOTE — Telephone Encounter (Signed)
Patient made an appointment to come in next week on 02/06/18

## 2018-02-03 ENCOUNTER — Other Ambulatory Visit: Payer: Medicare Other

## 2018-02-03 DIAGNOSIS — E1143 Type 2 diabetes mellitus with diabetic autonomic (poly)neuropathy: Secondary | ICD-10-CM | POA: Diagnosis not present

## 2018-02-03 DIAGNOSIS — E0842 Diabetes mellitus due to underlying condition with diabetic polyneuropathy: Secondary | ICD-10-CM | POA: Diagnosis not present

## 2018-02-03 DIAGNOSIS — E785 Hyperlipidemia, unspecified: Secondary | ICD-10-CM

## 2018-02-03 DIAGNOSIS — E1169 Type 2 diabetes mellitus with other specified complication: Secondary | ICD-10-CM | POA: Diagnosis not present

## 2018-02-03 DIAGNOSIS — I10 Essential (primary) hypertension: Secondary | ICD-10-CM | POA: Diagnosis not present

## 2018-02-04 LAB — COMPREHENSIVE METABOLIC PANEL
AG Ratio: 1.7 (calc) (ref 1.0–2.5)
ALBUMIN MSPROF: 4.8 g/dL (ref 3.6–5.1)
ALKALINE PHOSPHATASE (APISO): 83 U/L (ref 33–115)
ALT: 46 U/L — ABNORMAL HIGH (ref 6–29)
AST: 40 U/L — ABNORMAL HIGH (ref 10–35)
BILIRUBIN TOTAL: 0.3 mg/dL (ref 0.2–1.2)
BUN: 13 mg/dL (ref 7–25)
CALCIUM: 9.9 mg/dL (ref 8.6–10.2)
CHLORIDE: 97 mmol/L — AB (ref 98–110)
CO2: 27 mmol/L (ref 20–32)
Creat: 0.74 mg/dL (ref 0.50–1.10)
GLOBULIN: 2.8 g/dL (ref 1.9–3.7)
Glucose, Bld: 100 mg/dL — ABNORMAL HIGH (ref 65–99)
POTASSIUM: 4.6 mmol/L (ref 3.5–5.3)
Sodium: 139 mmol/L (ref 135–146)
Total Protein: 7.6 g/dL (ref 6.1–8.1)

## 2018-02-04 LAB — HEMOGLOBIN A1C
HEMOGLOBIN A1C: 6.3 %{Hb} — AB (ref <5.7)
MEAN PLASMA GLUCOSE: 134 (calc)
eAG (mmol/L): 7.4 (calc)

## 2018-02-04 LAB — CBC WITH DIFFERENTIAL/PLATELET
BASOS PCT: 0.4 %
Basophils Absolute: 34 cells/uL (ref 0–200)
Eosinophils Absolute: 176 cells/uL (ref 15–500)
Eosinophils Relative: 2.1 %
HEMATOCRIT: 42.4 % (ref 35.0–45.0)
HEMOGLOBIN: 14.6 g/dL (ref 11.7–15.5)
LYMPHS ABS: 3679 {cells}/uL (ref 850–3900)
MCH: 30.7 pg (ref 27.0–33.0)
MCHC: 34.4 g/dL (ref 32.0–36.0)
MCV: 89.3 fL (ref 80.0–100.0)
MPV: 10.7 fL (ref 7.5–12.5)
Monocytes Relative: 6.1 %
Neutro Abs: 3998 cells/uL (ref 1500–7800)
Neutrophils Relative %: 47.6 %
Platelets: 354 10*3/uL (ref 140–400)
RBC: 4.75 10*6/uL (ref 3.80–5.10)
RDW: 12.7 % (ref 11.0–15.0)
Total Lymphocyte: 43.8 %
WBC: 8.4 10*3/uL (ref 3.8–10.8)
WBCMIX: 512 {cells}/uL (ref 200–950)

## 2018-02-04 LAB — LIPID PANEL
CHOLESTEROL: 218 mg/dL — AB (ref <200)
HDL: 51 mg/dL (ref >50)
LDL Cholesterol (Calc): 133 mg/dL (calc) — ABNORMAL HIGH
Non-HDL Cholesterol (Calc): 167 mg/dL (calc) — ABNORMAL HIGH (ref <130)
TRIGLYCERIDES: 207 mg/dL — AB (ref <150)
Total CHOL/HDL Ratio: 4.3 (calc) (ref <5.0)

## 2018-02-06 ENCOUNTER — Encounter: Payer: Self-pay | Admitting: Family Medicine

## 2018-02-06 ENCOUNTER — Ambulatory Visit (INDEPENDENT_AMBULATORY_CARE_PROVIDER_SITE_OTHER): Payer: Medicare Other | Admitting: Family Medicine

## 2018-02-06 VITALS — BP 120/76 | HR 84 | Temp 98.2°F | Resp 16 | Ht 62.0 in | Wt 183.0 lb

## 2018-02-06 DIAGNOSIS — I10 Essential (primary) hypertension: Secondary | ICD-10-CM | POA: Diagnosis not present

## 2018-02-06 DIAGNOSIS — E1143 Type 2 diabetes mellitus with diabetic autonomic (poly)neuropathy: Secondary | ICD-10-CM | POA: Diagnosis not present

## 2018-02-06 DIAGNOSIS — E0842 Diabetes mellitus due to underlying condition with diabetic polyneuropathy: Secondary | ICD-10-CM

## 2018-02-06 DIAGNOSIS — Z23 Encounter for immunization: Secondary | ICD-10-CM

## 2018-02-06 DIAGNOSIS — E785 Hyperlipidemia, unspecified: Secondary | ICD-10-CM

## 2018-02-06 DIAGNOSIS — IMO0002 Reserved for concepts with insufficient information to code with codable children: Secondary | ICD-10-CM

## 2018-02-06 DIAGNOSIS — K760 Fatty (change of) liver, not elsewhere classified: Secondary | ICD-10-CM

## 2018-02-06 DIAGNOSIS — E1169 Type 2 diabetes mellitus with other specified complication: Secondary | ICD-10-CM

## 2018-02-06 DIAGNOSIS — G932 Benign intracranial hypertension: Secondary | ICD-10-CM

## 2018-02-06 DIAGNOSIS — G43709 Chronic migraine without aura, not intractable, without status migrainosus: Secondary | ICD-10-CM

## 2018-02-06 MED ORDER — ROSUVASTATIN CALCIUM 20 MG PO TABS: 20.0000 mg | ORAL_TABLET | Freq: Every day | ORAL | 3 refills | Status: DC

## 2018-02-06 NOTE — Progress Notes (Signed)
Subjective:    Patient ID: Jacqueline [name], female    DOB: 17-Dec-1965, 52 y.o.   MRN: [id number]  HPI  Patient is here today for follow-up of her chronic medical problems.  Regarding her diabetes mellitus type 2, her hemoglobin A1c is outstanding at 6.3.  She denies any polyuria, polydipsia, or blurry vision.  Her blood pressure is well controlled as well.  She denies any chest pain shortness of breath or dyspnea on exertion.  She denies any myalgias or right upper quadrant pain on Lipitor however her LDL cholesterol is above goal. Lab on 02/03/2018  Component Date Value Ref Range Status  . Hgb A1c MFr Bld 02/03/2018 6.3* <5.7 % of total Hgb Final   Comment: For someone without known diabetes, a hemoglobin  A1c value between 5.7% and 6.4% is consistent with prediabetes and should be confirmed with a  follow-up test. . For someone with known diabetes, a value <7% indicates that their diabetes is well controlled. A1c targets should be individualized based on duration of diabetes, age, comorbid conditions, and other considerations. . This assay result is consistent with an increased risk of diabetes. . Currently, no consensus exists regarding use of hemoglobin A1c for diagnosis of diabetes for children. .   . Mean Plasma Glucose 02/03/2018 134  (calc) Final  . eAG (mmol/L) 02/03/2018 7.4  (calc) Final  . Cholesterol 02/03/2018 218* <200 mg/dL Final  . HDL 02/03/2018 51  >50 mg/dL Final  . Triglycerides 02/03/2018 207* <150 mg/dL Final   Comment: . If a non-fasting specimen was collected, consider repeat triglyceride testing on a fasting specimen if clinically indicated.  Yates Decamp et al. J. of Clin. Lipidol. 9242;6:834-196. .   . LDL Cholesterol (Calc) 02/03/2018 133* mg/dL (calc) Final   Comment: Reference range: <100 . Desirable range <100 mg/dL for primary prevention;   <70 mg/dL for patients with CHD or diabetic patients  with > or = 2 CHD risk factors. Marland Kitchen LDL-C is now  calculated using the Martin-Hopkins  calculation, which is a validated novel method providing  better accuracy than the Friedewald equation in the  estimation of LDL-C.  Cresenciano Genre et al. Annamaria Helling. 2229;798(92): 2061-2068  (http://education.[facility].com/faq/FAQ164)   . Total CHOL/HDL Ratio 02/03/2018 4.3  <5.0 (calc) Final  . Non-HDL Cholesterol (Calc) 02/03/2018 167* <130 mg/dL (calc) Final   Comment: For patients with diabetes plus 1 major ASCVD risk  factor, treating to a non-HDL-C goal of <100 mg/dL  (LDL-C of <70 mg/dL) is considered a therapeutic  option.   . Glucose, Bld 02/03/2018 100* 65 - 99 mg/dL Final   Comment: .            Fasting reference interval . For someone without known diabetes, a glucose value between 100 and 125 mg/dL is consistent with prediabetes and should be confirmed with a follow-up test. .   . BUN 02/03/2018 13  7 - 25 mg/dL Final  . Creat 02/03/2018 0.74  0.50 - 1.10 mg/dL Final  . BUN/Creatinine Ratio 11/94/1740 NOT APPLICABLE  6 - 22 (calc) Final  . Sodium 02/03/2018 139  135 - 146 mmol/L Final  . Potassium 02/03/2018 4.6  3.5 - 5.3 mmol/L Final  . Chloride 02/03/2018 97* 98 - 110 mmol/L Final  . CO2 02/03/2018 27  20 - 32 mmol/L Final  . Calcium 02/03/2018 9.9  8.6 - 10.2 mg/dL Final  . Total Protein 02/03/2018 7.6  6.1 - 8.1 g/dL Final  . Albumin 02/03/2018 4.8  3.6 -  5.1 g/dL Final  . Globulin 02/03/2018 2.8  1.9 - 3.7 g/dL (calc) Final  . AG Ratio 02/03/2018 1.7  1.0 - 2.5 (calc) Final  . Total Bilirubin 02/03/2018 0.3  0.2 - 1.2 mg/dL Final  . Alkaline phosphatase (APISO) 02/03/2018 83  33 - 115 U/L Final  . AST 02/03/2018 40* 10 - 35 U/L Final  . ALT 02/03/2018 46* 6 - 29 U/L Final  . WBC 02/03/2018 8.4  3.8 - 10.8 Thousand/uL Final  . RBC 02/03/2018 4.75  3.80 - 5.10 Million/uL Final  . Hemoglobin 02/03/2018 14.6  11.7 - 15.5 g/dL Final  . HCT 02/03/2018 42.4  35.0 - 45.0 % Final  . MCV 02/03/2018 89.3  80.0 - 100.0 fL Final  .  MCH 02/03/2018 30.7  27.0 - 33.0 pg Final  . MCHC 02/03/2018 34.4  32.0 - 36.0 g/dL Final  . RDW 02/03/2018 12.7  11.0 - 15.0 % Final  . Platelets 02/03/2018 354  140 - 400 Thousand/uL Final  . MPV 02/03/2018 10.7  7.5 - 12.5 fL Final  . Neutro Abs 02/03/2018 3,998  1,500 - 7,800 cells/uL Final  . Lymphs Abs 02/03/2018 3,679  850 - 3,900 cells/uL Final  . WBC mixed population 02/03/2018 512  200 - 950 cells/uL Final  . Eosinophils Absolute 02/03/2018 176  15 - 500 cells/uL Final  . Basophils Absolute 02/03/2018 34  0 - 200 cells/uL Final  . Neutrophils Relative % 02/03/2018 47.6  % Final  . Total Lymphocyte 02/03/2018 43.8  % Final  . Monocytes Relative 02/03/2018 6.1  % Final  . Eosinophils Relative 02/03/2018 2.1  % Final  . Basophils Relative 02/03/2018 0.4  % Final   There are persistent elevations in her liver function test however this is improved since her last blood work.  She is requesting a referral to a neurologist.  She is previous he been seeing a neurologist in [facility] due to chronic migraines, neuropathy in her extremities including her arms and legs, as well as her history of pseudotumor cerebri.  Previously she was tried on gabapentin for nerve pain in her arms and legs.  The pain is worse in her feet where she describes a burning warmth that is constant as well as shooting stabbing pains radiating down her arms and pins-and-needles radiating up and down her arms.  Gabapentin was not effective and therefore oxcarbazepine was added in addition to Cymbalta by her neurologist.  However he is now retired and she is requiring a referral to a different neurologist.  She states that she has had MRIs as well as nerve conduction studies to rule out conditions such as MS, etc.  However I have no records from her previous neurologist that I would be able to share with her new neurologist  Past Medical History:  Diagnosis Date  . Abnormal transaminases   . Anemia   . Anxiety disorder     . Arthritis   . Asthma   . Cellulitis   . Chronic headaches   . Diverticulitis   . DM (diabetes mellitus) ([place])   . Erosive esophagitis   . Fatty liver   . GERD (gastroesophageal reflux disease)   . IBS (irritable bowel syndrome)   . Kidney stones   . Obesity   . Pneumonia   . Pseudotumor cerebri   . Sinus tachycardia    Past Surgical History:  Procedure Laterality Date  . ANKLE SURGERY    . COLONOSCOPY  06/19/2009  . EGD  06/09/2009  . FOOT SURGERY    . NASAL SEPTUM SURGERY    . ovarian cysts     x3  . TONSILLECTOMY    . TUBAL LIGATION    . VAGINAL HYSTERECTOMY     Current Outpatient Medications on File Prior to Visit  Medication Sig Dispense Refill  . aspirin EC 81 MG tablet Take 81 mg by mouth daily.    . Blood Glucose Monitoring Suppl (ONE TOUCH ULTRA MINI) w/Device KIT USE AS DIRECTED 1 each 0  . cetirizine (ZYRTEC) 10 MG tablet Take 10 mg by mouth daily.    . Cinnamon 500 MG capsule Take 1,000 mg by mouth daily.    . Cyanocobalamin (VITAMIN B 12 PO) Take by mouth.    . DULoxetine (CYMBALTA) 60 MG capsule Take 60 mg by mouth daily.    Marland Kitchen EPINEPHrine 0.3 mg/0.3 mL IJ SOAJ injection Inject 0.3 mg into the muscle once. Bee stings    . esomeprazole (NEXIUM) 40 MG capsule Take 1 capsule (40 mg total) by mouth 2 (two) times daily before a meal. 60 capsule 3  . furosemide (LASIX) 20 MG tablet TAKE 1 TABLET(20 MG) BY MOUTH TWICE DAILY 180 tablet 1  . gabapentin (NEURONTIN) 600 MG tablet Take 300-1,200 mg by mouth 3 (three) times daily as needed.     Marland Kitchen glucose blood (ONE TOUCH ULTRA TEST) test strip USE TO TEST BLOOD SUGAR TWICE DAILY 100 each 5  . metFORMIN (GLUCOPHAGE) 1000 MG tablet TAKE 1 TABLET (1,000 MG TOTAL) BY MOUTH 2 (TWO) TIMES DAILY WITH A MEAL. 180 tablet 3  . metoprolol succinate (TOPROL-XL) 50 MG 24 hr tablet TAKE ONE TABLET BY MOUTH ONE TIME DAILY WITH OR IMMEDIATELY FOLLOWING A MEAL 90 tablet 1  . ONETOUCH DELICA LANCETS 09O MISC USE TO OBTAIN BLOOD SPECIMEN 100  each 0  . oxcarbazepine (TRILEPTAL) 600 MG tablet TAKE 1 TABLET(600 MG) BY MOUTH DAILY 90 tablet 3  . VICTOZA 18 MG/3ML SOPN ADMINISTER 1.2 MG UNDER THE SKIN DAILY 6 mL 3   No current facility-administered medications on file prior to visit.    Allergies  Allergen Reactions  . Bee Pollen Anaphylaxis    BEE STINGS  . Naproxen Sodium Other (See Comments)    All pain meds, has fatty liver  . Reglan [Metoclopramide] Hives   Social History   Socioeconomic History  . Marital status: Married    Spouse name: Not on file  . Number of children: 3  . Years of education: Not on file  . Highest education level: Not on file  Occupational History  . Occupation: Disabled  Social Needs  . Financial resource strain: Not on file  . Food insecurity:    Worry: Not on file    Inability: Not on file  . Transportation needs:    Medical: Not on file    Non-medical: Not on file  Tobacco Use  . Smoking status: Former Research scientist (life sciences)  . Smokeless tobacco: Never Used  Substance and Sexual Activity  . Alcohol use: No  . Drug use: No  . Sexual activity: Not on file  Lifestyle  . Physical activity:    Days per week: Not on file    Minutes per session: Not on file  . Stress: Not on file  Relationships  . Social connections:    Talks on phone: Not on file    Gets together: Not on file    Attends religious service: Not on file    Active member of  club or organization: Not on file    Attends meetings of clubs or organizations: Not on file    Relationship status: Not on file  . Intimate partner violence:    Fear of current or ex partner: Not on file    Emotionally abused: Not on file    Physically abused: Not on file    Forced sexual activity: Not on file  Other Topics Concern  . Not on file  Social History Narrative  . Not on file   Family History  Problem Relation Age of Onset  . Colitis Unknown   . Crohn's disease Unknown   . Breast cancer Unknown   . Ovarian cancer Unknown   . Celiac  disease Unknown   . Clotting disorder Unknown   . Cystic fibrosis Unknown        pat. cousin  . Diabetes Mother   . Heart disease Unknown   . Irritable bowel syndrome Unknown   . Kidney failure Unknown 31       mat. nephew  . Diabetes Maternal Aunt   . Diabetes Son   . Diabetes Unknown        mat. nephew  . Colon cancer Neg Hx       Review of Systems  All other systems reviewed and are negative.      Objective:   Physical Exam  Constitutional: She appears well-developed and well-nourished. No distress.  HENT:  Head: Normocephalic and atraumatic.  Eyes: Pupils are equal, round, and reactive to light. Conjunctivae are normal. No scleral icterus.  Neck: Normal range of motion. Neck supple. No JVD present. No thyromegaly present.  Cardiovascular: Normal rate, regular rhythm, normal heart sounds and intact distal pulses. Exam reveals no gallop and no friction rub.  No murmur heard. Pulmonary/Chest: Effort normal and breath sounds normal. No respiratory distress. She has no wheezes. She has no rales. She exhibits no tenderness.  Abdominal: Soft. Bowel sounds are normal. She exhibits no distension and no mass. There is no tenderness. There is no rebound and no guarding.  Musculoskeletal: She exhibits no edema.  Skin: Skin is warm. No rash noted. She is not diaphoretic. No erythema. No pallor.  Psychiatric: She has a normal mood and affect.  Vitals reviewed.         Assessment & Plan:  Type 2 diabetes mellitus with diabetic autonomic neuropathy, without long-term current use of insulin (HCC)  Benign essential HTN  Dyslipidemia associated with type 2 diabetes mellitus (HCC)  Fatty liver  Pseudotumor cerebri  Chronic migraine  Diabetic polyneuropathy associated with diabetes mellitus due to underlying condition ([place])  Liver function test associated with her fatty liver disease have improved.  Blood pressure is well controlled.  Diabetes is well controlled.  I would  discontinue Lipitor and replace with Crestor 20 mg a day and recheck cholesterol in 3 months.  Regarding her pseudotumor, chronic migraines, and diffuse polyneuropathy, she is currently on a combination of gabapentin, oxcarbazepine, and duloxetine.  She is requesting referral to a new neurologist.  However have asked the patient to obtain her old records so that when I placed the referral, I have the available work-up for the neurologist to review including any MRIs of the head neck or lumbar spine as well as any EMG and nerve conduction studies that have been done.  Patient states that she will try to obtain a copy of these records so that we can place the referral.

## 2018-03-06 ENCOUNTER — Other Ambulatory Visit: Payer: Self-pay | Admitting: Family Medicine

## 2018-03-06 MED ORDER — ONETOUCH DELICA LANCETS 33G MISC: 3 refills | Status: DC

## 2018-03-23 ENCOUNTER — Other Ambulatory Visit: Payer: Self-pay | Admitting: Family Medicine

## 2018-04-24 ENCOUNTER — Encounter: Payer: Self-pay | Admitting: Family Medicine

## 2018-04-24 ENCOUNTER — Other Ambulatory Visit: Payer: Self-pay | Admitting: Family Medicine

## 2018-04-24 ENCOUNTER — Ambulatory Visit (INDEPENDENT_AMBULATORY_CARE_PROVIDER_SITE_OTHER): Payer: Medicare Other | Admitting: Family Medicine

## 2018-04-24 VITALS — BP 120/90 | HR 113 | Temp 97.9°F | Resp 16 | Ht 62.0 in | Wt 182.0 lb

## 2018-04-24 DIAGNOSIS — R1013 Epigastric pain: Secondary | ICD-10-CM | POA: Diagnosis not present

## 2018-04-24 MED ORDER — PANTOPRAZOLE SODIUM 40 MG PO TBEC: 40.0000 mg | DELAYED_RELEASE_TABLET | Freq: Every day | ORAL | 3 refills | Status: DC

## 2018-04-24 NOTE — Progress Notes (Signed)
Subjective:    Patient ID: Jacqueline [name], female    DOB: 07-Sep-1965, 52 y.o.   MRN: [id number]  HPI  Patient is a 52 year old Caucasian female with a past medical history of type 2 diabetes mellitus, fatty liver disease who presents with 3 weeks of epigastric abdominal pain.  The pain is located below the xiphoid process.  She states that the pain is intense.  It will begin suddenly and spread in a bandlike fashion from her right upper quadrant into her left upper quadrant.  It will then radiate straight into her back at the same level.  She will become extremely nauseated and have to vomit.  She states is almost like any food gets stuck in that area.  She denies any melena or hematochezia.  She is not taking any acid reflux medication.  However she denies any heartburn.  She does have occasional diarrhea.  The vomiting is nonbilious.  Patient is tender to palpation in the epigastric area and in the right upper quadrant.  She denies any jaundice or fever. Past Medical History:  Diagnosis Date  . Abnormal transaminases   . Anemia   . Anxiety disorder   . Arthritis   . Asthma   . Cellulitis   . Chronic headaches   . Diverticulitis   . DM (diabetes mellitus) ([place])   . Erosive esophagitis   . Fatty liver   . GERD (gastroesophageal reflux disease)   . IBS (irritable bowel syndrome)   . Kidney stones   . Obesity   . Pneumonia   . Pseudotumor cerebri   . Sinus tachycardia    Past Surgical History:  Procedure Laterality Date  . ANKLE SURGERY    . COLONOSCOPY  06/19/2009  . EGD  06/09/2009  . FOOT SURGERY    . NASAL SEPTUM SURGERY    . ovarian cysts     x3  . TONSILLECTOMY    . TUBAL LIGATION    . VAGINAL HYSTERECTOMY     Current Outpatient Medications on File Prior to Visit  Medication Sig Dispense Refill  . aspirin EC 81 MG tablet Take 81 mg by mouth daily.    . Blood Glucose Monitoring Suppl (ONE TOUCH ULTRA MINI) w/Device KIT USE AS DIRECTED 1 each 0  . cetirizine (ZYRTEC) 10  MG tablet Take 10 mg by mouth daily.    . Cinnamon 500 MG capsule Take 1,000 mg by mouth daily.    . Cyanocobalamin (VITAMIN B 12 PO) Take by mouth.    . DULoxetine (CYMBALTA) 60 MG capsule Take 60 mg by mouth daily.    Marland Kitchen EPINEPHrine 0.3 mg/0.3 mL IJ SOAJ injection Inject 0.3 mg into the muscle once. Bee stings    . esomeprazole (NEXIUM) 40 MG capsule Take 1 capsule (40 mg total) by mouth 2 (two) times daily before a meal. 60 capsule 3  . furosemide (LASIX) 20 MG tablet TAKE 1 TABLET(20 MG) BY MOUTH TWICE DAILY 180 tablet 1  . gabapentin (NEURONTIN) 600 MG tablet Take 300-1,200 mg by mouth 3 (three) times daily as needed.     Marland Kitchen glucose blood (ONE TOUCH ULTRA TEST) test strip USE TO TEST BLOOD SUGAR TWICE DAILY 100 each 5  . metFORMIN (GLUCOPHAGE) 1000 MG tablet TAKE 1 TABLET BY MOUTH TWICE DAILY WITH MEALS 180 tablet 1  . metoprolol succinate (TOPROL-XL) 50 MG 24 hr tablet TAKE ONE TABLET BY MOUTH ONE TIME DAILY WITH OR IMMEDIATELY FOLLOWING A MEAL 90 tablet 1  . ONETOUCH  DELICA LANCETS 94T MISC USE TO OBTAIN BLOOD SPECIMEN 100 each 3  . oxcarbazepine (TRILEPTAL) 600 MG tablet TAKE 1 TABLET(600 MG) BY MOUTH DAILY 90 tablet 3  . rosuvastatin (CRESTOR) 20 MG tablet Take 1 tablet (20 mg total) by mouth daily. 90 tablet 3  . VICTOZA 18 MG/3ML SOPN ADMINISTER 1.2 MG UNDER THE SKIN DAILY 6 mL 3   No current facility-administered medications on file prior to visit.    Allergies  Allergen Reactions  . Bee Pollen Anaphylaxis    BEE STINGS  . Naproxen Sodium Other (See Comments)    All pain meds, has fatty liver  . Reglan [Metoclopramide] Hives   Social History   Socioeconomic History  . Marital status: Married    Spouse name: Not on file  . Number of children: 3  . Years of education: Not on file  . Highest education level: Not on file  Occupational History  . Occupation: Disabled  Social Needs  . Financial resource strain: Not on file  . Food insecurity:    Worry: Not on file     Inability: Not on file  . Transportation needs:    Medical: Not on file    Non-medical: Not on file  Tobacco Use  . Smoking status: Former Research scientist (life sciences)  . Smokeless tobacco: Never Used  Substance and Sexual Activity  . Alcohol use: No  . Drug use: No  . Sexual activity: Not on file  Lifestyle  . Physical activity:    Days per week: Not on file    Minutes per session: Not on file  . Stress: Not on file  Relationships  . Social connections:    Talks on phone: Not on file    Gets together: Not on file    Attends religious service: Not on file    Active member of club or organization: Not on file    Attends meetings of clubs or organizations: Not on file    Relationship status: Not on file  . Intimate partner violence:    Fear of current or ex partner: Not on file    Emotionally abused: Not on file    Physically abused: Not on file    Forced sexual activity: Not on file  Other Topics Concern  . Not on file  Social History Narrative  . Not on file     Review of Systems  All other systems reviewed and are negative.      Objective:   Physical Exam Vitals signs reviewed.  Constitutional:      General: She is not in acute distress.    Appearance: She is obese. She is not ill-appearing, toxic-appearing or diaphoretic.  Cardiovascular:     Rate and Rhythm: Normal rate and regular rhythm.     Heart sounds: No murmur.  Pulmonary:     Effort: Pulmonary effort is normal. No respiratory distress.     Breath sounds: Normal breath sounds. No stridor. No wheezing, rhonchi or rales.  Chest:     Chest wall: No tenderness.  Abdominal:     General: Bowel sounds are normal. There is no distension.     Palpations: Abdomen is soft.     Tenderness: There is abdominal tenderness. There is no guarding or rebound.    Neurological:     Mental Status: She is alert.           Assessment & Plan:  Epigastric pain - Plan: CBC with Differential/Platelet, COMPLETE METABOLIC PANEL WITH GFR,  Lipase, US  Abdomen Limited RUQ  Differential diagnosis includes biliary colic, gastric outlet obstruction, pancreatitis, or peptic ulcer disease.  Begin Protonix 40 mg p.o. daily in case this is an ulcer.  Rule out pancreatitis with a lipase, obtain an ultrasound of the right upper quadrant to evaluate for gallstones.  If there are in fact gallstones, I would consult general surgery to discuss cholecystectomy.  If ultrasound and lab work is normal, I would recommend an EGD to evaluate for gastric outlet obstruction versus gastroparesis.  I recommended the patient go the emergency room immediately if the pain returns or intensifies.

## 2018-04-25 LAB — COMPLETE METABOLIC PANEL WITH GFR
AG Ratio: 2 (calc) (ref 1.0–2.5)
ALT: 76 U/L — AB (ref 6–29)
AST: 57 U/L — ABNORMAL HIGH (ref 10–35)
Albumin: 5.1 g/dL (ref 3.6–5.1)
Alkaline phosphatase (APISO): 78 U/L (ref 33–115)
BUN: 11 mg/dL (ref 7–25)
CO2: 30 mmol/L (ref 20–32)
Calcium: 10 mg/dL (ref 8.6–10.2)
Chloride: 95 mmol/L — ABNORMAL LOW (ref 98–110)
Creat: 0.62 mg/dL (ref 0.50–1.10)
GFR, Est African American: 123 mL/min/{1.73_m2} (ref >=60)
GFR, Est Non African American: 106 mL/min/{1.73_m2} (ref >=60)
Globulin: 2.5 g/dL (calc) (ref 1.9–3.7)
Glucose, Bld: 101 mg/dL — ABNORMAL HIGH (ref 65–99)
Potassium: 3.8 mmol/L (ref 3.5–5.3)
Sodium: 136 mmol/L (ref 135–146)
Total Bilirubin: 0.3 mg/dL (ref 0.2–1.2)
Total Protein: 7.6 g/dL (ref 6.1–8.1)

## 2018-04-25 LAB — CBC WITH DIFFERENTIAL/PLATELET
BASOS PCT: 0.4 %
Basophils Absolute: 33 cells/uL (ref 0–200)
Eosinophils Absolute: 139 cells/uL (ref 15–500)
Eosinophils Relative: 1.7 %
HCT: 42.2 % (ref 35.0–45.0)
Hemoglobin: 14.6 g/dL (ref 11.7–15.5)
Lymphs Abs: 3788 cells/uL (ref 850–3900)
MCH: 30.5 pg (ref 27.0–33.0)
MCHC: 34.6 g/dL (ref 32.0–36.0)
MCV: 88.1 fL (ref 80.0–100.0)
MONOS PCT: 6.7 %
MPV: 10.3 fL (ref 7.5–12.5)
Neutro Abs: 3690 cells/uL (ref 1500–7800)
Neutrophils Relative %: 45 %
PLATELETS: 384 10*3/uL (ref 140–400)
RBC: 4.79 10*6/uL (ref 3.80–5.10)
RDW: 12.3 % (ref 11.0–15.0)
TOTAL LYMPHOCYTE: 46.2 %
WBC mixed population: 549 cells/uL (ref 200–950)
WBC: 8.2 10*3/uL (ref 3.8–10.8)

## 2018-04-25 LAB — LIPASE: Lipase: 31 U/L (ref 7–60)

## 2018-04-28 ENCOUNTER — Telehealth: Payer: Self-pay | Admitting: Family Medicine

## 2018-04-28 MED ORDER — DULAGLUTIDE 0.75 MG/0.5ML ~~LOC~~ SOAJ: SUBCUTANEOUS | 0 refills | Status: DC

## 2018-04-28 NOTE — Telephone Encounter (Signed)
Pt would like to go ahead and start the trulicity call in to [facility] [address]

## 2018-04-28 NOTE — Telephone Encounter (Signed)
.  40m sent to pharm x 1 month - will send in for the 1.521mafter that.

## 2018-04-29 ENCOUNTER — Other Ambulatory Visit: Payer: Self-pay | Admitting: Family Medicine

## 2018-04-29 MED ORDER — GABAPENTIN 600 MG PO TABS: 300.0000 mg | ORAL_TABLET | Freq: Three times a day (TID) | ORAL | 3 refills | Status: DC | PRN

## 2018-04-30 ENCOUNTER — Other Ambulatory Visit: Payer: Self-pay | Admitting: Family Medicine

## 2018-05-04 ENCOUNTER — Ambulatory Visit
Admission: RE | Admit: 2018-05-04 | Discharge: 2018-05-04 | Disposition: A | Discharge status: Home / Self Care | Payer: Medicare Other | Source: Ambulatory Visit | Attending: Family Medicine | Admitting: Family Medicine

## 2018-05-04 DIAGNOSIS — R1013 Epigastric pain: Secondary | ICD-10-CM

## 2018-05-04 DIAGNOSIS — K7689 Other specified diseases of liver: Secondary | ICD-10-CM | POA: Diagnosis not present

## 2018-06-06 ENCOUNTER — Other Ambulatory Visit: Payer: Self-pay | Admitting: Family Medicine

## 2018-06-08 MED ORDER — DULAGLUTIDE 1.5 MG/0.5ML ~~LOC~~ SOAJ: 1.0000 "pen " | SUBCUTANEOUS | 11 refills | Status: DC

## 2018-06-25 ENCOUNTER — Other Ambulatory Visit: Payer: Self-pay | Admitting: Family Medicine

## 2018-06-25 DIAGNOSIS — Z1231 Encounter for screening mammogram for malignant neoplasm of breast: Secondary | ICD-10-CM

## 2018-07-17 ENCOUNTER — Encounter: Payer: Self-pay | Admitting: Family Medicine

## 2018-07-17 MED ORDER — METOPROLOL SUCCINATE ER 50 MG PO TB24: ORAL_TABLET | ORAL | 1 refills | Status: DC

## 2018-07-24 ENCOUNTER — Other Ambulatory Visit: Payer: Self-pay

## 2018-07-24 ENCOUNTER — Ambulatory Visit
Admission: RE | Admit: 2018-07-24 | Discharge: 2018-07-24 | Disposition: A | Discharge status: Home / Self Care | Payer: Medicare Other | Source: Ambulatory Visit | Attending: Family Medicine | Admitting: Family Medicine

## 2018-07-24 DIAGNOSIS — Z1231 Encounter for screening mammogram for malignant neoplasm of breast: Secondary | ICD-10-CM | POA: Diagnosis not present

## 2018-07-26 ENCOUNTER — Encounter: Payer: Self-pay | Admitting: Family Medicine

## 2018-07-27 ENCOUNTER — Other Ambulatory Visit: Payer: Self-pay | Admitting: Family Medicine

## 2018-07-27 MED ORDER — DULOXETINE HCL 60 MG PO CPEP: 60.0000 mg | ORAL_CAPSULE | Freq: Every day | ORAL | 3 refills | Status: DC

## 2018-08-17 ENCOUNTER — Other Ambulatory Visit: Payer: Self-pay | Admitting: Family Medicine

## 2018-09-13 ENCOUNTER — Other Ambulatory Visit: Payer: Self-pay | Admitting: Family Medicine

## 2018-09-14 ENCOUNTER — Other Ambulatory Visit: Payer: Self-pay | Admitting: Family Medicine

## 2018-09-19 ENCOUNTER — Encounter: Payer: Self-pay | Admitting: Family Medicine

## 2018-09-24 ENCOUNTER — Other Ambulatory Visit: Payer: Self-pay

## 2018-09-24 ENCOUNTER — Ambulatory Visit (INDEPENDENT_AMBULATORY_CARE_PROVIDER_SITE_OTHER): Payer: Medicare Other | Admitting: Family Medicine

## 2018-09-24 DIAGNOSIS — J019 Acute sinusitis, unspecified: Secondary | ICD-10-CM

## 2018-09-24 DIAGNOSIS — B9689 Other specified bacterial agents as the cause of diseases classified elsewhere: Secondary | ICD-10-CM | POA: Diagnosis not present

## 2018-09-24 MED ORDER — AMOXICILLIN-POT CLAVULANATE 875-125 MG PO TABS: 1.0000 | ORAL_TABLET | Freq: Two times a day (BID) | ORAL | 0 refills | Status: DC

## 2018-09-24 MED ORDER — FLUTICASONE PROPIONATE 50 MCG/ACT NA SUSP: 2.0000 | Freq: Every day | NASAL | 6 refills | Status: DC

## 2018-09-24 NOTE — Progress Notes (Signed)
Subjective:    Patient ID: Jacqueline [name], female    DOB: [birth year], 53 y.o.   MRN: [id number]  HPI Patient is being seen today over the telephone.  She consents to be seen over the telephone.  She is currently at home.  I am currently in my office.  Phone call began at 207.  Phone call ended at 215.  Patient states that she is had a sinus infection for 3 weeks.  When I asked her to clarify, she states that she is had constant pain and pressure in her right maxillary sinus for about 3 weeks.  She reports a constant dull headache.  The headache and the pain is worse when she bends over such as to put on her shoes or pick something up off the floor.  She is constantly blowing her nose.  The mucus that is produced is thick and green occasionally is brown.  She denies any epistaxis.  She denies any cough or shortness of breath.  She denies any fever.  However she does have severe congestion and postnasal drip causing a sore throat.  She has been taking cetirizine every day without any relief.  She is also been using nasal saline without any relief.  She denies any sick contacts. Past Medical History:  Diagnosis Date  . Abnormal transaminases   . Anemia   . Anxiety disorder   . Arthritis   . Asthma   . Cellulitis   . Chronic headaches   . Diverticulitis   . DM (diabetes mellitus) ([place])   . Erosive esophagitis   . Fatty liver   . GERD (gastroesophageal reflux disease)   . IBS (irritable bowel syndrome)   . Kidney stones   . Obesity   . Pneumonia   . Pseudotumor cerebri   . Sinus tachycardia    Past Surgical History:  Procedure Laterality Date  . ANKLE SURGERY    . COLONOSCOPY  06/19/2009  . EGD  06/09/2009  . FOOT SURGERY    . NASAL SEPTUM SURGERY    . ovarian cysts     x3  . TONSILLECTOMY    . TUBAL LIGATION    . VAGINAL HYSTERECTOMY     Current Outpatient Medications on File Prior to Visit  Medication Sig Dispense Refill  . aspirin EC 81 MG tablet Take 81 mg by mouth daily.     . cetirizine (ZYRTEC) 10 MG tablet Take 10 mg by mouth daily.    . Dulaglutide (TRULICITY) 1.5 YQ/6.5HQ SOPN Inject 1 pen into the skin once a week. 4 pen 11  . DULoxetine (CYMBALTA) 60 MG capsule Take 1 capsule (60 mg total) by mouth daily. 90 capsule 3  . furosemide (LASIX) 20 MG tablet TAKE 1 TABLET(20 MG) BY MOUTH TWICE DAILY 180 tablet 1  . gabapentin (NEURONTIN) 600 MG tablet TAKE 1/2 TO 2 TABLETS(300 TO 1200 MG) BY MOUTH THREE TIMES DAILY AS NEEDED 180 tablet 3  . metFORMIN (GLUCOPHAGE) 1000 MG tablet TAKE 1 TABLET BY MOUTH TWICE DAILY WITH MEALS 180 tablet 1  . metoprolol succinate (TOPROL-XL) 50 MG 24 hr tablet TAKE ONE TABLET BY MOUTH ONE TIME DAILY WITH OR IMMEDIATELY FOLLOWING A MEAL 90 tablet 1  . oxcarbazepine (TRILEPTAL) 600 MG tablet TAKE 1 TABLET(600 MG) BY MOUTH DAILY 90 tablet 3  . pantoprazole (PROTONIX) 40 MG tablet TAKE 1 TABLET(40 MG) BY MOUTH DAILY 90 tablet 3  . rosuvastatin (CRESTOR) 20 MG tablet Take 1 tablet (20 mg total) by mouth  daily. 90 tablet 3  . Blood Glucose Monitoring Suppl (ONE TOUCH ULTRA MINI) w/Device KIT USE AS DIRECTED 1 each 0  . Cinnamon 500 MG capsule Take 1,000 mg by mouth daily.    . Cyanocobalamin (VITAMIN B 12 PO) Take by mouth.    . EPINEPHrine 0.3 mg/0.3 mL IJ SOAJ injection Inject 0.3 mg into the muscle once. Bee stings    . glucose blood (ONE TOUCH ULTRA TEST) test strip USE TO TEST BLOOD SUGAR TWICE DAILY 100 each 5  . Lancets (ONETOUCH DELICA PLUS [id number]) MISC USE TO TEST BLOOD SUGAR TWICE DAILY 100 each 3   No current facility-administered medications on file prior to visit.    Allergies  Allergen Reactions  . Bee Pollen Anaphylaxis    BEE STINGS  . Naproxen Sodium Other (See Comments)    All pain meds, has fatty liver  . Reglan [Metoclopramide] Hives   Social History   Socioeconomic History  . Marital status: Married    Spouse name: Not on file  . Number of children: 3  . Years of education: Not on file  . Highest  education level: Not on file  Occupational History  . Occupation: Disabled  Social Needs  . Financial resource strain: Not on file  . Food insecurity:    Worry: Not on file    Inability: Not on file  . Transportation needs:    Medical: Not on file    Non-medical: Not on file  Tobacco Use  . Smoking status: Former Research scientist (life sciences)  . Smokeless tobacco: Never Used  Substance and Sexual Activity  . Alcohol use: No  . Drug use: No  . Sexual activity: Not on file  Lifestyle  . Physical activity:    Days per week: Not on file    Minutes per session: Not on file  . Stress: Not on file  Relationships  . Social connections:    Talks on phone: Not on file    Gets together: Not on file    Attends religious service: Not on file    Active member of club or organization: Not on file    Attends meetings of clubs or organizations: Not on file    Relationship status: Not on file  . Intimate partner violence:    Fear of current or ex partner: Not on file    Emotionally abused: Not on file    Physically abused: Not on file    Forced sexual activity: Not on file  Other Topics Concern  . Not on file  Social History Narrative  . Not on file      Review of Systems  All other systems reviewed and are negative.      Objective:   Physical Exam  Physical exam cannot be performed today as the patient was seen over the telephone however she sounds congested.  She is speaking full and complete sentences with no evidence of respiratory distress.      Assessment & Plan:  Acute bacterial rhinosinusitis  Symptoms are consistent with a sinus infection.  Discontinue cetirizine and replace with Flonase 2 sprays each nostril daily and add Augmentin 875 mg p.o. twice daily for 10 days.  Recheck in 2 weeks if no better or sooner if worse.

## 2018-09-25 ENCOUNTER — Other Ambulatory Visit: Payer: Self-pay | Admitting: Family Medicine

## 2018-10-16 ENCOUNTER — Encounter: Payer: Self-pay | Admitting: Family Medicine

## 2018-10-16 NOTE — Telephone Encounter (Signed)
May I send in refills on patient's valtrex

## 2018-10-18 ENCOUNTER — Other Ambulatory Visit: Payer: Self-pay | Admitting: Family Medicine

## 2018-10-18 MED ORDER — VALACYCLOVIR HCL 500 MG PO TABS: 500.0000 mg | ORAL_TABLET | Freq: Two times a day (BID) | ORAL | 2 refills | Status: DC

## 2018-10-20 IMAGING — MR MR LUMBAR SPINE W/O CM
5 series · 45 of 48 positions shown · non-contrast
Comparison: CT Abdomen and Pelvis 11/13/2010.

CLINICAL DATA: 50-year-old female with lumbar back pain and right
leg pain. Pain to the touch to her left inner thigh. Pain goes all
the way down to her ankle. Symptoms for over a month. No known
injury. Initial encounter.

EXAM:
MRI LUMBAR SPINE WITHOUT CONTRAST
TECHNIQUE: Multiplanar, multisequence MR imaging of the lumbar spine was
performed. No intravenous contrast was administered.

[Series 3: T2 · sagittal · 4.0mm · 0.94mm/px · 6 of 13 slices shown (1 of 2)]
[im 1/13]
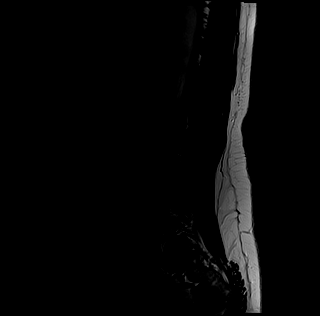
[im 3/13]
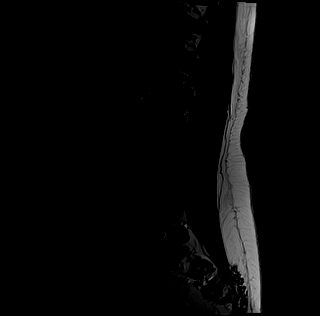
[im 5/13]
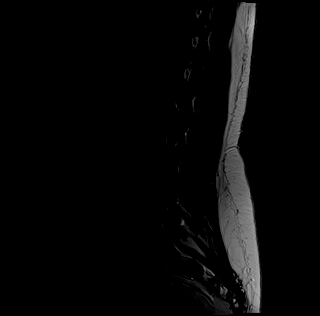
[im 8/13]
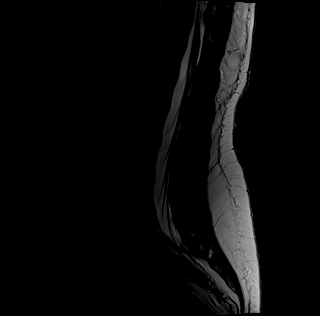
[im 10/13]
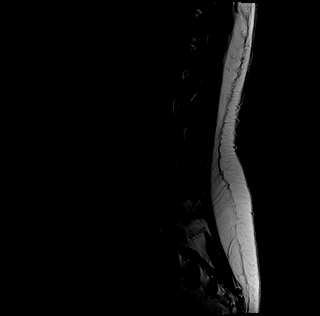
[im 13/13]
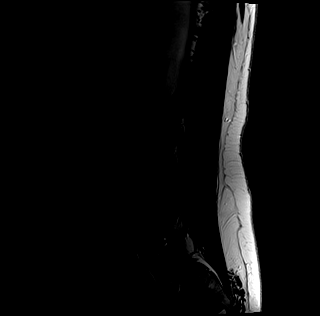

[Series 4: T1 · sagittal · 4.0mm · 0.94mm/px · 6 of 13 slices shown (1 of 2)]
[im 1/13]
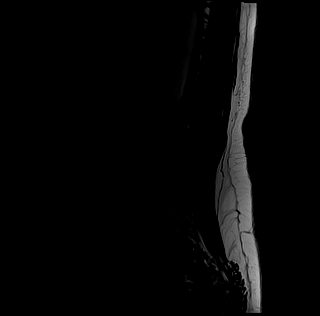
[im 3/13]
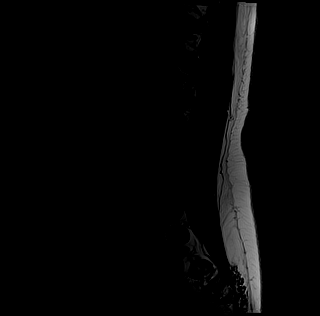
[im 5/13]
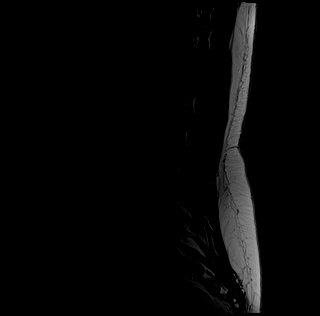
[im 8/13]
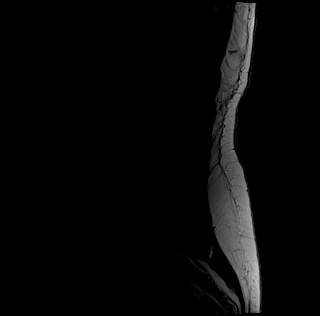
[im 10/13]
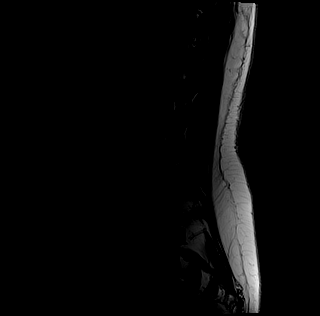
[im 13/13]
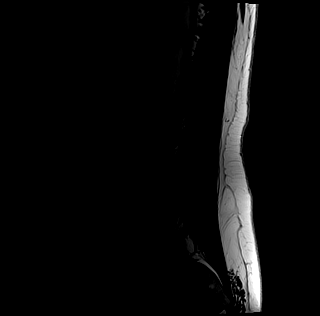

[Series 5: tirm sag · sagittal · 4.0mm · 0.59mm/px · 6 of 13 slices shown]
[im 1/13]
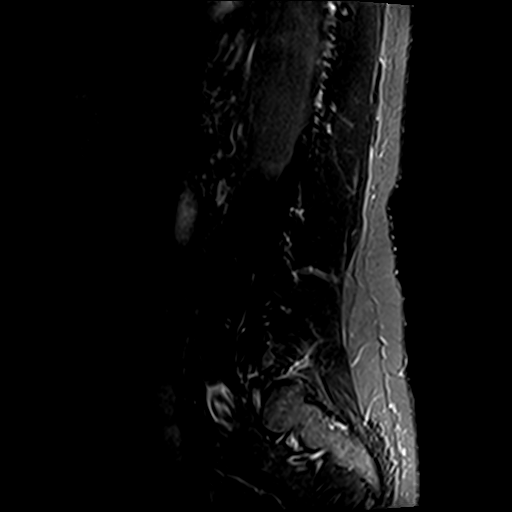
[im 3/13]
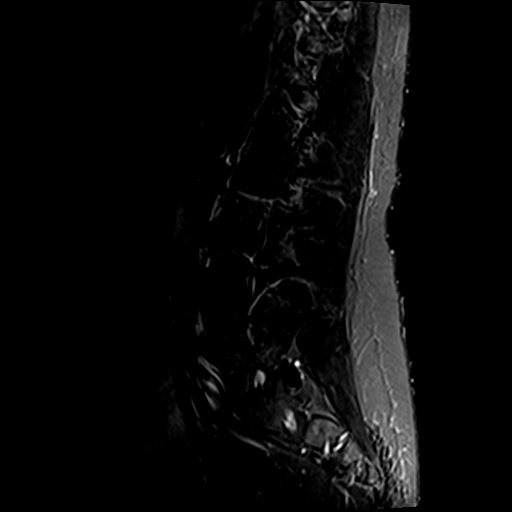
[im 5/13]
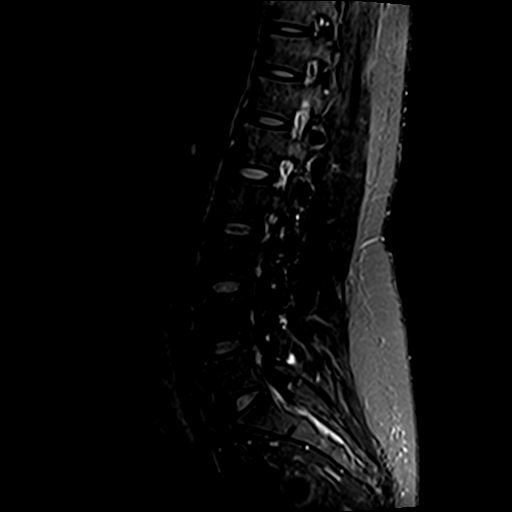
[im 8/13]
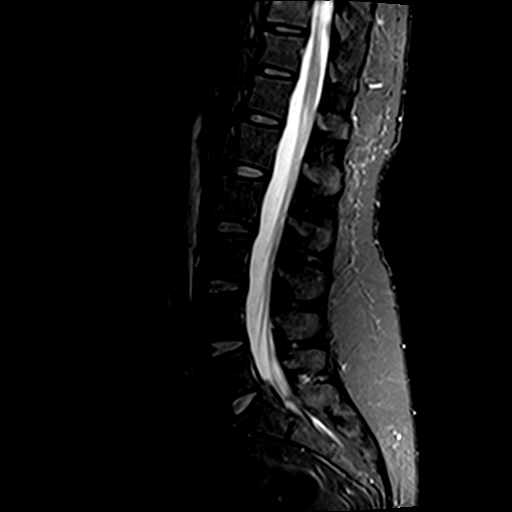
[im 10/13]
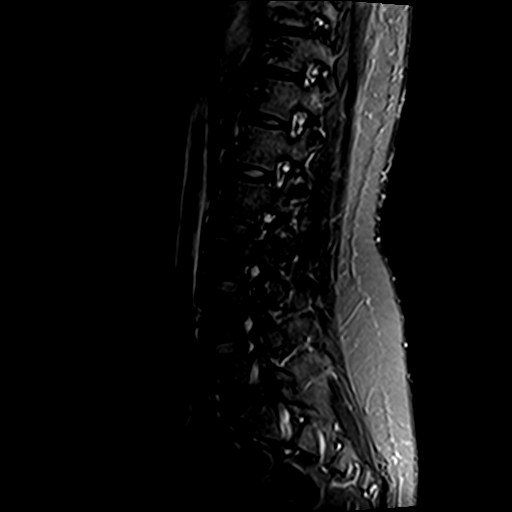
[im 13/13]
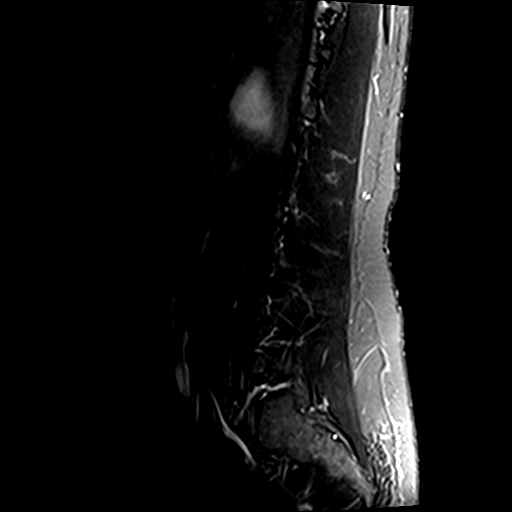

[Series 6: T1 · axial · 4.0mm · 0.78mm/px · z∈[-82,+101]mm · 12 of 32 slices shown (2 of 2)]
[im 1/32]
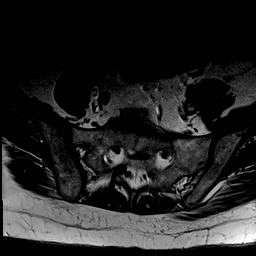
[im 3/32]
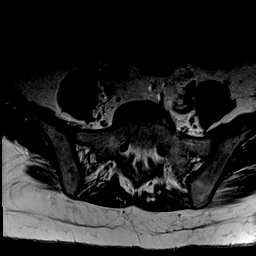
[im 5/32]
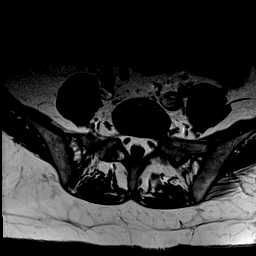
[im 7/32]
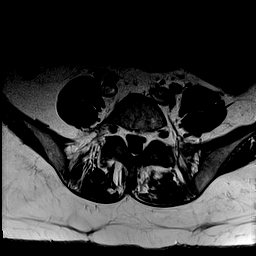
[im 9/32]
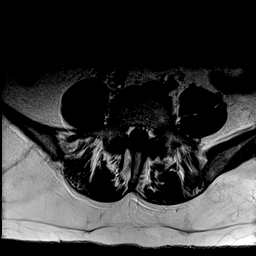
[im 12/32]
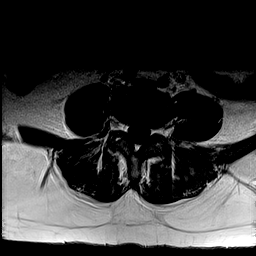
[im 14/32]
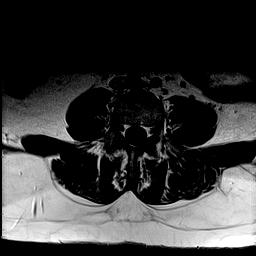
[im 16/32]
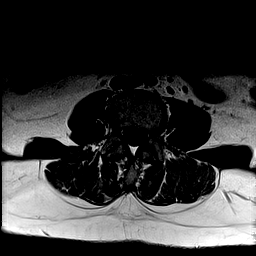
[im 18/32]
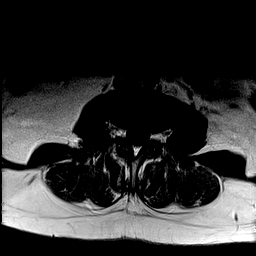
[im 23/32]
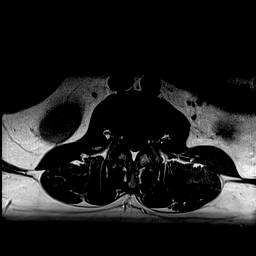
[im 27/32]
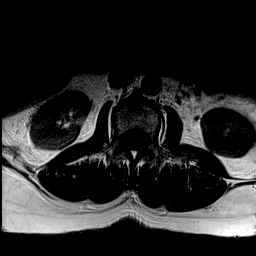
[im 32/32]
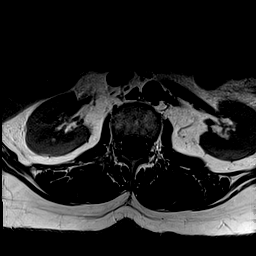

[Series 7: T2 · axial · 4.0mm · 0.78mm/px · z∈[-82,+101]mm · 15 of 32 slices shown (2 of 2)]
[im 1/32]
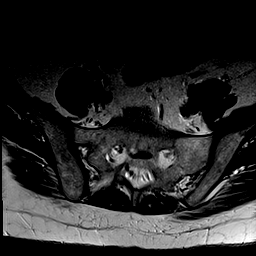
[im 3/32]
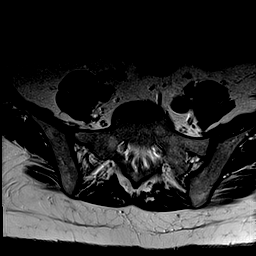
[im 5/32]
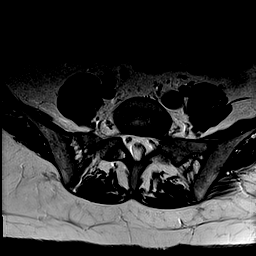
[im 7/32]
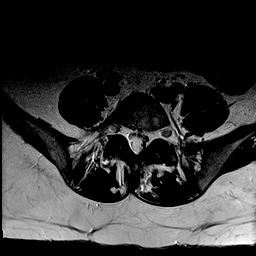
[im 9/32]
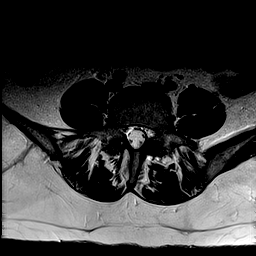
[im 12/32]
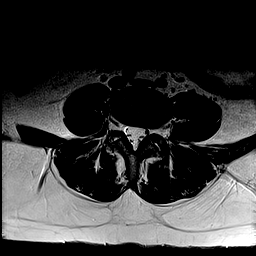
[im 14/32]
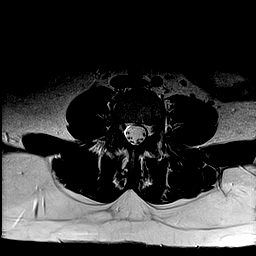
[im 16/32]
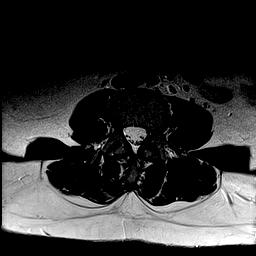
[im 18/32]
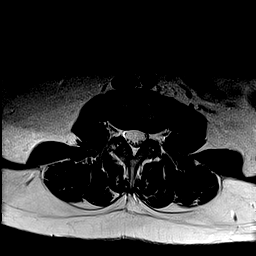
[im 20/32]
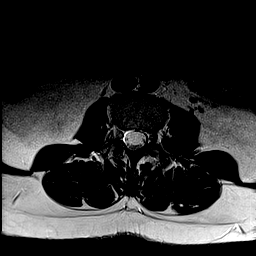
[im 23/32]
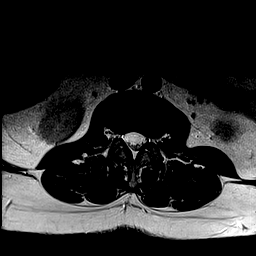
[im 25/32]
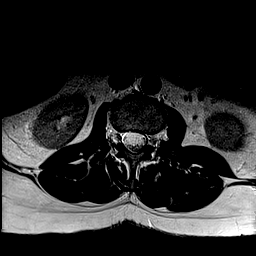
[im 27/32]
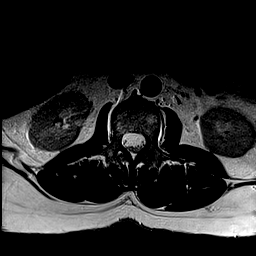
[im 29/32]
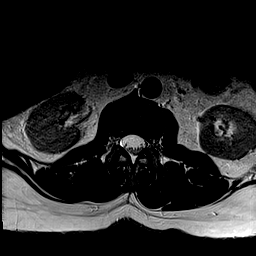
[im 32/32]
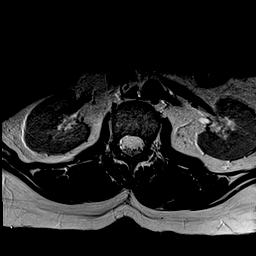

[45 of 48 positions shown; findings below may reference images not displayed]

FINDINGS: Segmentation:  Normal as seen on the comparison.

Alignment:  Stable and normal.

Vertebrae: Normal bone marrow signal. No marrow edema or evidence of
acute osseous abnormality. Normal visible sacrum and SI joints.

Conus medullaris: Extends to the T12-L1 level and appears normal.

Paraspinal and other soft tissues: Negative.

Disc levels:

T10-T11:  Mild facet hypertrophy.

T10-T11: Negative.

T12-L1:  Negative.

L1-L2:  Negative.

L2-L3:  Minimal circumferential disc bulge.  No stenosis.

L3-L4:  Negative.

L4-L5:  Negative.

L5-S1:  Negative.
IMPRESSION: Normal for age MRI appearance of the lumbar spine. No spinal
stenosis or neural impingement identified.

## 2018-10-26 ENCOUNTER — Other Ambulatory Visit: Payer: Self-pay | Admitting: Family Medicine

## 2018-10-26 DIAGNOSIS — G932 Benign intracranial hypertension: Secondary | ICD-10-CM | POA: Diagnosis not present

## 2018-10-26 DIAGNOSIS — H2513 Age-related nuclear cataract, bilateral: Secondary | ICD-10-CM | POA: Diagnosis not present

## 2018-11-04 ENCOUNTER — Other Ambulatory Visit: Payer: Self-pay | Admitting: Family Medicine

## 2018-12-24 ENCOUNTER — Other Ambulatory Visit: Payer: Self-pay | Admitting: Family Medicine

## 2019-01-13 ENCOUNTER — Other Ambulatory Visit: Payer: Self-pay | Admitting: Family Medicine

## 2019-01-22 ENCOUNTER — Other Ambulatory Visit: Payer: Self-pay | Admitting: Family Medicine

## 2019-02-02 ENCOUNTER — Other Ambulatory Visit: Payer: Self-pay | Admitting: Family Medicine

## 2019-02-08 ENCOUNTER — Encounter: Payer: Self-pay | Admitting: Family Medicine

## 2019-02-09 ENCOUNTER — Other Ambulatory Visit: Payer: Self-pay | Admitting: Family Medicine

## 2019-02-09 DIAGNOSIS — E119 Type 2 diabetes mellitus without complications: Secondary | ICD-10-CM | POA: Diagnosis not present

## 2019-02-09 DIAGNOSIS — Z7689 Persons encountering health services in other specified circumstances: Secondary | ICD-10-CM | POA: Diagnosis not present

## 2019-02-09 MED ORDER — VALACYCLOVIR HCL 500 MG PO TABS: 500.0000 mg | ORAL_TABLET | Freq: Two times a day (BID) | ORAL | 2 refills | Status: DC

## 2019-02-26 ENCOUNTER — Other Ambulatory Visit: Payer: Self-pay

## 2019-02-26 ENCOUNTER — Ambulatory Visit (INDEPENDENT_AMBULATORY_CARE_PROVIDER_SITE_OTHER): Payer: Medicare Other | Admitting: Family Medicine

## 2019-02-26 VITALS — BP 140/90 | HR 64 | Temp 97.9°F | Resp 18 | Ht 62.0 in | Wt 186.0 lb

## 2019-02-26 DIAGNOSIS — Z23 Encounter for immunization: Secondary | ICD-10-CM

## 2019-02-26 DIAGNOSIS — I10 Essential (primary) hypertension: Secondary | ICD-10-CM

## 2019-02-26 DIAGNOSIS — E1169 Type 2 diabetes mellitus with other specified complication: Secondary | ICD-10-CM

## 2019-02-26 DIAGNOSIS — E1143 Type 2 diabetes mellitus with diabetic autonomic (poly)neuropathy: Secondary | ICD-10-CM

## 2019-02-26 DIAGNOSIS — Z0000 Encounter for general adult medical examination without abnormal findings: Secondary | ICD-10-CM | POA: Diagnosis not present

## 2019-02-26 DIAGNOSIS — E0842 Diabetes mellitus due to underlying condition with diabetic polyneuropathy: Secondary | ICD-10-CM

## 2019-02-26 MED ORDER — DULOXETINE HCL 60 MG PO CPEP: 120.0000 mg | ORAL_CAPSULE | Freq: Every day | ORAL | 3 refills | Status: DC

## 2019-02-26 NOTE — Progress Notes (Signed)
Subjective:    Patient ID: Jacqueline [name], female    DOB: 1965-06-18, 53 y.o.   MRN: [id number]  HPI  Patient is here today for a complete physical exam.  She is due for fasting lab work to monitor the management of her diabetes.  Her last hemoglobin A1c was at the beginning of this year in early spring.  She has had her mammogram this year and it was normal.  Her last colonoscopy was in 2011.  This is due again in 2021.  Patient has a history of a hysterectomy and therefore does not require a Pap smear.  Immunizations are up-to-date except for a flu shot which she received today.  Since I last saw the patient, her grandchild died suddenly and tragically.  Foul play is suspected.  Her son is suing for custody of the other children however they are currently in foster care.  The child died under the care of his mom and boyfriend.  Obviously this is caused a tremendous amount of stress for the patient but she seems to be doing extremely well.  She does have severe neuropathic pain in her feet despite taking up to 1800 mg of gabapentin at night as prescribed by her previous neurologist.  She also takes 60 mg of Cymbalta and 600 mg of Trileptal.  She is interested in increasing the Cymbalta to try to help the neuropathic pain  Immunization History  Administered Date(s) Administered  . Influenza,inj,Quad PF,6+ Mos 02/02/2016, 04/29/2017, 02/06/2018  . Pneumococcal Polysaccharide-23 10/24/2014  . Tdap 12/17/2011    Past Medical History:  Diagnosis Date  . Abnormal transaminases   . Anemia   . Anxiety disorder   . Arthritis   . Asthma   . Cellulitis   . Chronic headaches   . Diverticulitis   . DM (diabetes mellitus) ([place])   . Erosive esophagitis   . Fatty liver   . GERD (gastroesophageal reflux disease)   . IBS (irritable bowel syndrome)   . Kidney stones   . Obesity   . Pneumonia   . Pseudotumor cerebri   . Sinus tachycardia    Past Surgical History:  Procedure Laterality Date  .  ANKLE SURGERY    . COLONOSCOPY  06/19/2009  . EGD  06/09/2009  . FOOT SURGERY    . NASAL SEPTUM SURGERY    . ovarian cysts     x3  . TONSILLECTOMY    . TUBAL LIGATION    . VAGINAL HYSTERECTOMY     Current Outpatient Medications on File Prior to Visit  Medication Sig Dispense Refill  . aspirin EC 81 MG tablet Take 81 mg by mouth daily.    . Blood Glucose Monitoring Suppl (ONE TOUCH ULTRA MINI) w/Device KIT USE AS DIRECTED 1 each 0  . Cinnamon 500 MG capsule Take 1,000 mg by mouth daily.    . Cyanocobalamin (VITAMIN B 12 PO) Take by mouth.    . Dulaglutide (TRULICITY) 1.5 GY/6.9SW SOPN Inject 1 pen into the skin once a week. 4 pen 11  . DULoxetine (CYMBALTA) 60 MG capsule Take 1 capsule (60 mg total) by mouth daily. 90 capsule 3  . EPINEPHrine 0.3 mg/0.3 mL IJ SOAJ injection Inject 0.3 mg into the muscle once. Bee stings    . fluticasone (FLONASE) 50 MCG/ACT nasal spray Place 2 sprays into both nostrils daily. 16 g 6  . furosemide (LASIX) 20 MG tablet TAKE 1 TABLET(20 MG) BY MOUTH TWICE DAILY 180 tablet 1  . gabapentin (  NEURONTIN) 600 MG tablet TAKE 1/2 TO 2 TABLETS(300 TO 1200 MG) BY MOUTH THREE TIMES DAILY AS NEEDED 180 tablet 3  . Insulin Pen Needle (B-D ULTRAFINE III SHORT PEN) 31G X 8 MM MISC USE DAILY WITH VICTOZA PEN 100 each 5  . Lancets (ONETOUCH DELICA PLUS [id number]) MISC USE TO TEST BLOOD SUGAR TWICE DAILY 100 each 3  . metFORMIN (GLUCOPHAGE) 1000 MG tablet TAKE 1 TABLET BY MOUTH TWICE DAILY WITH MEALS 180 tablet 1  . metoprolol succinate (TOPROL-XL) 50 MG 24 hr tablet TAKE 1 TABLET BY MOUTH EVERY DAY WITH OR IMMEDIATELY FOLLOWING A MEAL 90 tablet 1  . ONETOUCH ULTRA test strip USE TO TEST BLOOD SUGAR TWICE DAILY 100 strip 3  . oxcarbazepine (TRILEPTAL) 600 MG tablet TAKE 1 TABLET(600 MG) BY MOUTH DAILY 90 tablet 3  . pantoprazole (PROTONIX) 40 MG tablet TAKE 1 TABLET(40 MG) BY MOUTH DAILY 90 tablet 3  . rosuvastatin (CRESTOR) 20 MG tablet TAKE 1 TABLET BY MOUTH EVERY DAY 90  tablet 3  . valACYclovir (VALTREX) 500 MG tablet Take 1 tablet (500 mg total) by mouth 2 (two) times daily. 6 tablet 2   No current facility-administered medications on file prior to visit.    Allergies  Allergen Reactions  . Bee Pollen Anaphylaxis    BEE STINGS  . Naproxen Sodium Other (See Comments)    All pain meds, has fatty liver  . Reglan [Metoclopramide] Hives   Social History   Socioeconomic History  . Marital status: Married    Spouse name: Not on file  . Number of children: 3  . Years of education: Not on file  . Highest education level: Not on file  Occupational History  . Occupation: Disabled  Social Needs  . Financial resource strain: Not on file  . Food insecurity    Worry: Not on file    Inability: Not on file  . Transportation needs    Medical: Not on file    Non-medical: Not on file  Tobacco Use  . Smoking status: Former Research scientist (life sciences)  . Smokeless tobacco: Never Used  Substance and Sexual Activity  . Alcohol use: No  . Drug use: No  . Sexual activity: Not on file  Lifestyle  . Physical activity    Days per week: Not on file    Minutes per session: Not on file  . Stress: Not on file  Relationships  . Social Herbalist on phone: Not on file    Gets together: Not on file    Attends religious service: Not on file    Active member of club or organization: Not on file    Attends meetings of clubs or organizations: Not on file    Relationship status: Not on file  . Intimate partner violence    Fear of current or ex partner: Not on file    Emotionally abused: Not on file    Physically abused: Not on file    Forced sexual activity: Not on file  Other Topics Concern  . Not on file  Social History Narrative  . Not on file   Family History  Problem Relation Age of Onset  . Colitis Other   . Crohn's disease Other   . Breast cancer Other   . Ovarian cancer Other   . Celiac disease Other   . Clotting disorder Other   . Cystic fibrosis Other         pat. cousin  . Diabetes Mother   .  Heart disease Other   . Irritable bowel syndrome Other   . Kidney failure Other 31       mat. nephew  . Diabetes Maternal Aunt   . Diabetes Son   . Diabetes Other        mat. nephew  . Colon cancer Neg Hx       Review of Systems  All other systems reviewed and are negative.      Objective:   Physical Exam  Constitutional: She is oriented to person, place, and time. She appears well-developed and well-nourished. No distress.  HENT:  Head: Normocephalic and atraumatic.  Right Ear: External ear normal.  Left Ear: External ear normal.  Nose: Nose normal.  Mouth/Throat: Oropharynx is clear and moist. No oropharyngeal exudate.  Eyes: Pupils are equal, round, and reactive to light. Conjunctivae and EOM are normal. Right eye exhibits no discharge. Left eye exhibits no discharge. No scleral icterus.  Neck: Normal range of motion. Neck supple. No JVD present. No thyromegaly present.  Cardiovascular: Normal rate, regular rhythm, normal heart sounds and intact distal pulses. Exam reveals no gallop and no friction rub.  No murmur heard. Pulmonary/Chest: Effort normal and breath sounds normal. No stridor. No respiratory distress. She has no wheezes. She has no rales. She exhibits no tenderness.  Abdominal: Soft. Bowel sounds are normal. She exhibits no distension and no mass. There is no abdominal tenderness. There is no rebound and no guarding.  Musculoskeletal: Normal range of motion.        General: No tenderness or edema.  Lymphadenopathy:    She has no cervical adenopathy.  Neurological: She is alert and oriented to person, place, and time. She has normal reflexes. No cranial nerve deficit. She exhibits normal muscle tone. Coordination normal.  Skin: Skin is warm. No rash noted. She is not diaphoretic. No erythema. No pallor.  Psychiatric: She has a normal mood and affect. Her behavior is normal. Judgment and thought content normal.  Vitals  reviewed.         Assessment & Plan:  Type 2 diabetes mellitus with diabetic autonomic neuropathy, without long-term current use of insulin (HCC) - Plan: Hemoglobin A1c, CBC with Differential/Platelet, COMPLETE METABOLIC PANEL WITH GFR, Lipid panel, Microalbumin, urine  Dyslipidemia associated with type 2 diabetes mellitus ([place])  Benign essential HTN  Diabetic polyneuropathy associated with diabetes mellitus due to underlying condition [facility])  General medical exam  Physical exam today is normal except for obesity.  Her blood pressure is adequately controlled.  Check CBC, CMP, fasting lipid panel, and urine microalbumin.  Diabetic eye exam was performed in June.  Diabetic foot exam was performed today.  Patient received her flu shot.  She does not require Pap smear.  Colonoscopy is due next year.  Mammogram is up-to-date.  Regular anticipatory guidance is provided.  We will increase Cymbalta at this 120 mg daily and see if this will help manage her neuropathy better.  Monitor for side effects and/or sedation.

## 2019-02-27 LAB — CBC WITH DIFFERENTIAL/PLATELET
Absolute Monocytes: 540 cells/uL (ref 200–950)
Basophils Absolute: 23 cells/uL (ref 0–200)
Basophils Relative: 0.3 %
Eosinophils Absolute: 113 cells/uL (ref 15–500)
Eosinophils Relative: 1.5 %
HCT: 44.5 % (ref 35.0–45.0)
Hemoglobin: 14.7 g/dL (ref 11.7–15.5)
Lymphs Abs: 3173 cells/uL (ref 850–3900)
MCH: 29.2 pg (ref 27.0–33.0)
MCHC: 33 g/dL (ref 32.0–36.0)
MCV: 88.3 fL (ref 80.0–100.0)
MPV: 10.7 fL (ref 7.5–12.5)
Monocytes Relative: 7.2 %
Neutro Abs: 3653 cells/uL (ref 1500–7800)
Neutrophils Relative %: 48.7 %
Platelets: 368 10*3/uL (ref 140–400)
RBC: 5.04 10*6/uL (ref 3.80–5.10)
RDW: 12.6 % (ref 11.0–15.0)
Total Lymphocyte: 42.3 %
WBC: 7.5 10*3/uL (ref 3.8–10.8)

## 2019-02-27 LAB — COMPLETE METABOLIC PANEL WITH GFR
AG Ratio: 2 (calc) (ref 1.0–2.5)
ALT: 67 U/L — ABNORMAL HIGH (ref 6–29)
AST: 72 U/L — ABNORMAL HIGH (ref 10–35)
Albumin: 5.4 g/dL — ABNORMAL HIGH (ref 3.6–5.1)
Alkaline phosphatase (APISO): 82 U/L (ref 37–153)
BUN: 17 mg/dL (ref 7–25)
CO2: 29 mmol/L (ref 20–32)
Calcium: 10.8 mg/dL — ABNORMAL HIGH (ref 8.6–10.4)
Chloride: 94 mmol/L — ABNORMAL LOW (ref 98–110)
Creat: 0.77 mg/dL (ref 0.50–1.05)
GFR, Est African American: 104 mL/min/{1.73_m2} (ref >=60)
GFR, Est Non African American: 90 mL/min/{1.73_m2} (ref >=60)
Globulin: 2.7 g/dL (calc) (ref 1.9–3.7)
Glucose, Bld: 113 mg/dL — ABNORMAL HIGH (ref 65–99)
Potassium: 4.9 mmol/L (ref 3.5–5.3)
Sodium: 142 mmol/L (ref 135–146)
Total Bilirubin: 0.4 mg/dL (ref 0.2–1.2)
Total Protein: 8.1 g/dL (ref 6.1–8.1)

## 2019-02-27 LAB — HEMOGLOBIN A1C
Hgb A1c MFr Bld: 6.6 % of total Hgb — ABNORMAL HIGH (ref <5.7)
Mean Plasma Glucose: 143 (calc)
eAG (mmol/L): 7.9 (calc)

## 2019-02-27 LAB — MICROALBUMIN, URINE: Microalb, Ur: 1 mg/dL

## 2019-02-27 LAB — LIPID PANEL
Cholesterol: 196 mg/dL (ref <200)
HDL: 49 mg/dL — ABNORMAL LOW (ref >=50)
LDL Cholesterol (Calc): 112 mg/dL (calc) — ABNORMAL HIGH
Non-HDL Cholesterol (Calc): 147 mg/dL (calc) — ABNORMAL HIGH (ref <130)
Total CHOL/HDL Ratio: 4 (calc) (ref <5.0)
Triglycerides: 231 mg/dL — ABNORMAL HIGH (ref <150)

## 2019-03-01 ENCOUNTER — Other Ambulatory Visit: Payer: Self-pay | Admitting: Family Medicine

## 2019-03-01 ENCOUNTER — Encounter: Payer: Self-pay | Admitting: Family Medicine

## 2019-03-08 ENCOUNTER — Other Ambulatory Visit: Payer: Self-pay

## 2019-03-08 MED ORDER — EPINEPHRINE 0.3 MG/0.3ML IJ SOAJ: 0.3000 mg | Freq: Once | INTRAMUSCULAR | 0 refills | Status: AC

## 2019-03-08 MED ORDER — PREDNISONE 20 MG PO TABS: ORAL_TABLET | ORAL | 0 refills | Status: DC

## 2019-03-08 NOTE — Telephone Encounter (Signed)
Pt called and stats  -she was stung by a bee and she has some swelling around the bee sting above the buttock. She is not having any facial or tongue swelling. No SOB.   Per Dr. Dennard Schaumann  - Pred taper pack, benadryl 74m po q 4-6 hours and refill Epipen.  Meds sent to pharm and pt aware

## 2019-03-15 ENCOUNTER — Other Ambulatory Visit: Payer: Medicare Other

## 2019-03-15 ENCOUNTER — Other Ambulatory Visit: Payer: Self-pay

## 2019-03-16 LAB — CALCIUM: Calcium: 10.1 mg/dL (ref 8.6–10.4)

## 2019-03-21 ENCOUNTER — Other Ambulatory Visit: Payer: Self-pay | Admitting: Family Medicine

## 2019-03-28 ENCOUNTER — Other Ambulatory Visit: Payer: Self-pay | Admitting: Family Medicine

## 2019-04-20 ENCOUNTER — Other Ambulatory Visit: Payer: Self-pay | Admitting: Family Medicine

## 2019-04-20 MED ORDER — FUROSEMIDE 20 MG PO TABS: ORAL_TABLET | ORAL | 3 refills | Status: DC

## 2019-05-03 ENCOUNTER — Encounter: Payer: Self-pay | Admitting: Family Medicine

## 2019-05-14 ENCOUNTER — Other Ambulatory Visit: Payer: Self-pay | Admitting: Family Medicine

## 2019-05-31 ENCOUNTER — Encounter: Payer: Self-pay | Admitting: Family Medicine

## 2019-06-01 ENCOUNTER — Other Ambulatory Visit: Payer: Self-pay | Admitting: Family Medicine

## 2019-06-01 MED ORDER — TRIAMCINOLONE ACETONIDE 0.1 % EX CREA: 1.0000 "application " | TOPICAL_CREAM | Freq: Two times a day (BID) | CUTANEOUS | 0 refills | Status: DC

## 2019-06-13 ENCOUNTER — Other Ambulatory Visit: Payer: Self-pay | Admitting: Family Medicine

## 2019-06-25 ENCOUNTER — Other Ambulatory Visit: Payer: Self-pay | Admitting: Family Medicine

## 2019-06-25 MED ORDER — DULOXETINE HCL 60 MG PO CPEP: 120.0000 mg | ORAL_CAPSULE | Freq: Every day | ORAL | 3 refills | Status: DC

## 2019-07-10 ENCOUNTER — Other Ambulatory Visit: Payer: Self-pay | Admitting: Family Medicine

## 2019-07-11 ENCOUNTER — Other Ambulatory Visit: Payer: Self-pay | Admitting: Family Medicine

## 2019-08-13 ENCOUNTER — Other Ambulatory Visit: Payer: Self-pay | Admitting: Family Medicine

## 2019-09-14 ENCOUNTER — Other Ambulatory Visit: Payer: Self-pay | Admitting: Family Medicine

## 2019-09-17 ENCOUNTER — Other Ambulatory Visit: Payer: Self-pay | Admitting: Family Medicine

## 2019-09-30 ENCOUNTER — Encounter: Payer: Self-pay | Admitting: Family Medicine

## 2019-09-30 ENCOUNTER — Other Ambulatory Visit: Payer: Self-pay | Admitting: *Deleted

## 2019-09-30 MED ORDER — BLOOD GLUCOSE SYSTEM PAK KIT
PACK | 1 refills | Status: DC
Start: 2019-09-30 — End: 2023-01-27

## 2019-09-30 MED ORDER — ONETOUCH ULTRA MINI W/DEVICE KIT: PACK | 1 refills | Status: DC

## 2019-09-30 MED ORDER — BLOOD GLUCOSE TEST VI STRP
ORAL_STRIP | 1 refills | Status: DC
Start: 2019-09-30 — End: 2020-01-13

## 2019-10-05 ENCOUNTER — Other Ambulatory Visit: Payer: Self-pay | Admitting: Family Medicine

## 2019-10-11 ENCOUNTER — Other Ambulatory Visit: Payer: Self-pay | Admitting: Family Medicine

## 2019-10-30 ENCOUNTER — Other Ambulatory Visit: Payer: Self-pay | Admitting: Family Medicine

## 2019-11-16 ENCOUNTER — Other Ambulatory Visit: Payer: Self-pay

## 2019-11-16 ENCOUNTER — Ambulatory Visit (INDEPENDENT_AMBULATORY_CARE_PROVIDER_SITE_OTHER): Payer: Medicare Other | Admitting: Family Medicine

## 2019-11-16 VITALS — BP 120/90 | HR 95 | Temp 97.8°F | Ht 62.0 in | Wt 191.0 lb

## 2019-11-16 DIAGNOSIS — H6021 Malignant otitis externa, right ear: Secondary | ICD-10-CM

## 2019-11-16 DIAGNOSIS — E1143 Type 2 diabetes mellitus with diabetic autonomic (poly)neuropathy: Secondary | ICD-10-CM | POA: Diagnosis not present

## 2019-11-16 DIAGNOSIS — L739 Follicular disorder, unspecified: Secondary | ICD-10-CM

## 2019-11-16 MED ORDER — DOXYCYCLINE HYCLATE 100 MG PO TABS: 100.0000 mg | ORAL_TABLET | Freq: Two times a day (BID) | ORAL | 0 refills | Status: DC

## 2019-11-16 MED ORDER — NEOMYCIN-POLYMYXIN-HC 3.5-10000-1 OT SOLN
3.0000 [drp] | Freq: Four times a day (QID) | OTIC | 0 refills | Status: DC
Start: 2019-11-16 — End: 2020-12-15

## 2019-11-16 NOTE — Progress Notes (Signed)
Subjective:    Patient ID: Jacqueline [name], female    DOB: 02-29-1966, 54 y.o.   MRN: [id number]  HPI  Patient is a 54 year old Caucasian female who presents today complaining of 4-day history of pain in her right ear.  The ear canal is swollen and tender.  It hurts for me to insert the speculum.  The tympanic membrane itself appears completely normal.  She does have some tenderness to palpation over the tragus and also behind the ear over the mastoid process.  Examination of the oropharynx shows no swelling in the jaw and no abscessed tooth.  There is no tenderness to palpation in the mandible or in the maxilla.  Patient also has 2 large inflamed erythematous tender pustules on her posterior scalp.  These appear to be folliculitis.  Each is approximately 1 cm in diameter.  She has been applying eczema cream without relief.  She would like to check her fasting lab work while she is here. Past Medical History:  Diagnosis Date  . Abnormal transaminases   . Anemia   . Anxiety disorder   . Arthritis   . Asthma   . Cellulitis   . Chronic headaches   . Diverticulitis   . DM (diabetes mellitus) ([place])   . Erosive esophagitis   . Fatty liver   . GERD (gastroesophageal reflux disease)   . IBS (irritable bowel syndrome)   . Kidney stones   . Obesity   . Pneumonia   . Pseudotumor cerebri   . Sinus tachycardia    Past Surgical History:  Procedure Laterality Date  . ANKLE SURGERY    . COLONOSCOPY  06/19/2009  . EGD  06/09/2009  . FOOT SURGERY    . NASAL SEPTUM SURGERY    . ovarian cysts     x3  . TONSILLECTOMY    . TUBAL LIGATION    . VAGINAL HYSTERECTOMY     Current Outpatient Medications on File Prior to Visit  Medication Sig Dispense Refill  . aspirin EC 81 MG tablet Take 81 mg by mouth daily.    . Blood Glucose Monitoring Suppl (BLOOD GLUCOSE SYSTEM PAK) KIT Please dispense based on patient and insurance preference. Use as directed to monitor FSBS 2x daily. Dx: E11.9. 1 kit 1  .  Cyanocobalamin (VITAMIN B 12 PO) Take by mouth.    . DULoxetine (CYMBALTA) 60 MG capsule TAKE 2 CAPSULES(120 MG) BY MOUTH DAILY 60 capsule 3  . fluticasone (FLONASE) 50 MCG/ACT nasal spray Place 2 sprays into both nostrils daily. 16 g 6  . furosemide (LASIX) 20 MG tablet TAKE 1 TABLET(20 MG) BY MOUTH TWICE DAILY 180 tablet 3  . gabapentin (NEURONTIN) 600 MG tablet TAKE 1/2 TO 2 TABLETS(300 TO 1200 MG) BY MOUTH THREE TIMES DAILY AS NEEDED 180 tablet 3  . Glucose Blood (BLOOD GLUCOSE TEST STRIPS) STRP Please dispense based on patient and insurance preference. Use as directed to monitor FSBS 2x daily. Dx: E11.9. 100 strip 1  . metFORMIN (GLUCOPHAGE) 1000 MG tablet TAKE 1 TABLET BY MOUTH TWICE DAILY WITH MEALS 180 tablet 1  . metoprolol succinate (TOPROL-XL) 50 MG 24 hr tablet TAKE 1 TABLET BY MOUTH EVERY DAY WITH OR IMMEDIATELY FOLLOWING A MEAL 90 tablet 1  . oxcarbazepine (TRILEPTAL) 600 MG tablet TAKE 1 TABLET(600 MG) BY MOUTH DAILY 90 tablet 3  . pantoprazole (PROTONIX) 40 MG tablet TAKE 1 TABLET(40 MG) BY MOUTH DAILY 90 tablet 3  . rosuvastatin (CRESTOR) 20 MG tablet TAKE 1 TABLET  BY MOUTH EVERY DAY 90 tablet 3  . triamcinolone cream (KENALOG) 0.1 % Apply 1 application topically 2 (two) times daily. 30 g 0  . TRULICITY 1.5 EH/2.0NO SOPN INJECT 1 PEN INTO THE SKIN ONCE A WEEK 2 mL 3  . valACYclovir (VALTREX) 500 MG tablet Take 1 tablet (500 mg total) by mouth 2 (two) times daily. 6 tablet 2  . Cinnamon 500 MG capsule Take 1,000 mg by mouth daily.    . Insulin Pen Needle (B-D ULTRAFINE III SHORT PEN) 31G X 8 MM MISC USE DAILY WITH VICTOZA PEN 100 each 5  . Lancets (ONETOUCH DELICA PLUS [id number]) MISC USE TO TEST BLOOD SUGAR TWICE DAILY (Patient not taking: Reported on 11/16/2019) 100 each 3  . predniSONE (DELTASONE) 20 MG tablet 3 tabs po qd x 2 days, 2 tabs po qd x 2 days, 1 tab po qd x 2 days 12 tablet 0   No current facility-administered medications on file prior to visit.   Allergies   Allergen Reactions  . Bee Pollen Anaphylaxis    BEE STINGS  . Naproxen Sodium Other (See Comments)    All pain meds, has fatty liver  . Reglan [Metoclopramide] Hives   Social History   Socioeconomic History  . Marital status: Married    Spouse name: Not on file  . Number of children: 3  . Years of education: Not on file  . Highest education level: Not on file  Occupational History  . Occupation: Disabled  Tobacco Use  . Smoking status: Former Research scientist (life sciences)  . Smokeless tobacco: Never Used  Vaping Use  . Vaping Use: Never used  Substance and Sexual Activity  . Alcohol use: No  . Drug use: No  . Sexual activity: Not on file  Other Topics Concern  . Not on file  Social History Narrative  . Not on file   Social Determinants of Health   Financial Resource Strain:   . Difficulty of Paying Living Expenses:   Food Insecurity:   . Worried About Charity fundraiser in the Last Year:   . Arboriculturist in the Last Year:   Transportation Needs:   . Film/video editor (Medical):   Marland Kitchen Lack of Transportation (Non-Medical):   Physical Activity:   . Days of Exercise per Week:   . Minutes of Exercise per Session:   Stress:   . Feeling of Stress :   Social Connections:   . Frequency of Communication with Friends and Family:   . Frequency of Social Gatherings with Friends and Family:   . Attends Religious Services:   . Active Member of Clubs or Organizations:   . Attends Archivist Meetings:   Marland Kitchen Marital Status:   Intimate Partner Violence:   . Fear of Current or Ex-Partner:   . Emotionally Abused:   Marland Kitchen Physically Abused:   . Sexually Abused:      Review of Systems  All other systems reviewed and are negative.      Objective:   Physical Exam Vitals reviewed.  Constitutional:      General: She is not in acute distress.    Appearance: She is obese. She is not ill-appearing, toxic-appearing or diaphoretic.  HENT:     Head:      Right Ear: Swelling and  tenderness present. There is mastoid tenderness. Tympanic membrane is not injected, scarred or perforated.  Cardiovascular:     Rate and Rhythm: Normal rate and regular rhythm.  Heart sounds: No murmur heard.   Pulmonary:     Effort: Pulmonary effort is normal. No respiratory distress.     Breath sounds: Normal breath sounds. No stridor. No wheezing, rhonchi or rales.  Chest:     Chest wall: No tenderness.  Abdominal:     General: Bowel sounds are normal. There is no distension.     Palpations: Abdomen is soft.     Tenderness: There is no guarding or rebound.  Neurological:     Mental Status: She is alert.           Assessment & Plan:  Acute malignant otitis externa of right ear  Folliculitis  Type 2 diabetes mellitus with diabetic autonomic neuropathy, without long-term current use of insulin (HCC) - Plan: Hemoglobin A1c, CBC with Differential/Platelet, COMPLETE METABOLIC PANEL WITH GFR, Lipid panel I will treat her otitis externa with Cortisporin HC otic 4 drops in the right ear canal 4 times a day for 1 week.  I will treat her folliculitis with doxycycline 100 mg p.o. twice daily for 7 days.  I will check her fasting lab work to monitor her diabetes including a CMP, fasting lipid panel, and hemoglobin A1c.

## 2019-11-17 LAB — COMPLETE METABOLIC PANEL WITH GFR
AG Ratio: 1.6 (calc) (ref 1.0–2.5)
ALT: 46 U/L — ABNORMAL HIGH (ref 6–29)
AST: 42 U/L — ABNORMAL HIGH (ref 10–35)
Albumin: 4.7 g/dL (ref 3.6–5.1)
Alkaline phosphatase (APISO): 92 U/L (ref 37–153)
BUN: 8 mg/dL (ref 7–25)
CO2: 27 mmol/L (ref 20–32)
Calcium: 9.7 mg/dL (ref 8.6–10.4)
Chloride: 97 mmol/L — ABNORMAL LOW (ref 98–110)
Creat: 0.71 mg/dL (ref 0.50–1.05)
GFR, Est African American: 114 mL/min/{1.73_m2} (ref >=60)
GFR, Est Non African American: 99 mL/min/{1.73_m2} (ref >=60)
Globulin: 2.9 g/dL (calc) (ref 1.9–3.7)
Glucose, Bld: 113 mg/dL — ABNORMAL HIGH (ref 65–99)
Potassium: 4.5 mmol/L (ref 3.5–5.3)
Sodium: 141 mmol/L (ref 135–146)
Total Bilirubin: 0.3 mg/dL (ref 0.2–1.2)
Total Protein: 7.6 g/dL (ref 6.1–8.1)

## 2019-11-17 LAB — CBC WITH DIFFERENTIAL/PLATELET
Absolute Monocytes: 523 cells/uL (ref 200–950)
Basophils Absolute: 31 cells/uL (ref 0–200)
Basophils Relative: 0.4 %
Eosinophils Absolute: 133 cells/uL (ref 15–500)
Eosinophils Relative: 1.7 %
HCT: 41 % (ref 35.0–45.0)
Hemoglobin: 13.5 g/dL (ref 11.7–15.5)
Lymphs Abs: 2995 cells/uL (ref 850–3900)
MCH: 29.2 pg (ref 27.0–33.0)
MCHC: 32.9 g/dL (ref 32.0–36.0)
MCV: 88.7 fL (ref 80.0–100.0)
MPV: 10.9 fL (ref 7.5–12.5)
Monocytes Relative: 6.7 %
Neutro Abs: 4118 cells/uL (ref 1500–7800)
Neutrophils Relative %: 52.8 %
Platelets: 351 10*3/uL (ref 140–400)
RBC: 4.62 10*6/uL (ref 3.80–5.10)
RDW: 12.6 % (ref 11.0–15.0)
Total Lymphocyte: 38.4 %
WBC: 7.8 10*3/uL (ref 3.8–10.8)

## 2019-11-17 LAB — HEMOGLOBIN A1C
Hgb A1c MFr Bld: 6.6 % of total Hgb — ABNORMAL HIGH (ref <5.7)
Mean Plasma Glucose: 143 (calc)
eAG (mmol/L): 7.9 (calc)

## 2019-11-17 LAB — LIPID PANEL
Cholesterol: 196 mg/dL (ref <200)
HDL: 60 mg/dL (ref >=50)
LDL Cholesterol (Calc): 104 mg/dL (calc) — ABNORMAL HIGH
Non-HDL Cholesterol (Calc): 136 mg/dL (calc) — ABNORMAL HIGH (ref <130)
Total CHOL/HDL Ratio: 3.3 (calc) (ref <5.0)
Triglycerides: 204 mg/dL — ABNORMAL HIGH (ref <150)

## 2019-12-15 ENCOUNTER — Telehealth: Payer: Self-pay | Admitting: Family Medicine

## 2019-12-15 NOTE — Progress Notes (Signed)
  Chronic Care Management   Outreach Note  12/15/2019 Name: Jacqueline [name] MRN: [id number] DOB: 03/28/1966  Referred by: Susy Frizzle, MD Reason for referral : No chief complaint on file.   An unsuccessful telephone outreach was attempted today. The patient was referred to the pharmacist for assistance with care management and care coordination.   Follow Up Plan:   Carley Perdue [facility] Scheduler

## 2019-12-16 ENCOUNTER — Encounter: Payer: Self-pay | Admitting: Family Medicine

## 2019-12-22 ENCOUNTER — Telehealth: Payer: Self-pay | Admitting: Family Medicine

## 2019-12-22 NOTE — Progress Notes (Signed)
  Chronic Care Management   Note  12/22/2019 Name: Jacqueline [name] MRN: [id number] DOB: 11/20/1965  Jacqueline [name] is a 54 y.o. year old female who is a primary care patient of Pickard, Cammie Mcgee, MD. I reached out to Stasia Cavalier by phone today in response to a referral sent by Jacqueline [name] PCP, Susy Frizzle, MD.   Jacqueline [name] was given information about Chronic Care Management services today including:  1. CCM service includes personalized support from designated clinical staff supervised by her physician, including individualized plan of care and coordination with other care providers 2. 24/7 contact phone numbers for assistance for urgent and routine care needs. 3. Service will only be billed when office clinical staff spend 20 minutes or more in a month to coordinate care. 4. Only one practitioner may furnish and bill the service in a calendar month. 5. The patient may stop CCM services at any time (effective at the end of the month) by phone call to the office staff.   Patient agreed to services and verbal consent obtained.   Follow up plan:   Carley Perdue [facility] Scheduler

## 2019-12-26 ENCOUNTER — Other Ambulatory Visit: Payer: Self-pay | Admitting: Family Medicine

## 2020-01-08 ENCOUNTER — Other Ambulatory Visit: Payer: Self-pay | Admitting: Family Medicine

## 2020-01-10 ENCOUNTER — Other Ambulatory Visit: Payer: Self-pay | Admitting: Family Medicine

## 2020-01-10 MED ORDER — TRIAMCINOLONE ACETONIDE 0.1 % EX CREA: 1.0000 "application " | TOPICAL_CREAM | Freq: Two times a day (BID) | CUTANEOUS | 0 refills | Status: DC

## 2020-01-11 ENCOUNTER — Other Ambulatory Visit: Payer: Self-pay | Admitting: Family Medicine

## 2020-01-26 ENCOUNTER — Other Ambulatory Visit: Payer: Self-pay | Admitting: Family Medicine

## 2020-02-04 ENCOUNTER — Encounter: Payer: Self-pay | Admitting: Family Medicine

## 2020-02-14 NOTE — Chronic Care Management (AMB) (Addendum)
Chronic Care Management Pharmacy  Name: Jacqueline [name]  MRN: [id number] DOB: [birth year]  Chief Complaint/ HPI  Jacqueline [name],  54 y.o. , female presents for their Initial CCM visit with the clinical pharmacist via telephone.  PCP : Susy Frizzle, MD  Their chronic conditions include: HTN, GERD, Type II DM w/ neuropathy, HLD  Office Visits: 11/16/2019 (Pickard) -  Pain in R ear Given Cortisporin HC ear soln, monitor fasting lab work Also given doxycycline 193m bid x 7  Consult Visit:none recent  Medications: Outpatient Encounter Medications as of 02/16/2020  Medication Sig   Beta Carotene (VITAMIN A) 25000 UNIT capsule Take 25,000 Units by mouth daily.   cetirizine (ZYRTEC) 10 MG tablet Take 10 mg by mouth daily.   vitamin E 1000 UNIT capsule Take 1,000 Units by mouth daily.   zinc gluconate 50 MG tablet Take 50 mg by mouth daily.   ACCU-CHEK GUIDE test strip USE TO CHECK FASTING BLOOD SUGAR TWICE DAILY   aspirin EC 81 MG tablet Take 81 mg by mouth daily.   Blood Glucose Monitoring Suppl (BLOOD GLUCOSE SYSTEM PAK) KIT Please dispense based on patient and insurance preference. Use as directed to monitor FSBS 2x daily. Dx: E11.9.   Cinnamon 500 MG capsule Take 1,000 mg by mouth daily.   Cyanocobalamin (VITAMIN B 12 PO) Take by mouth.   doxycycline (VIBRA-TABS) 100 MG tablet Take 1 tablet (100 mg total) by mouth 2 (two) times daily. (Patient not taking: Reported on 02/16/2020)   DULoxetine (CYMBALTA) 60 MG capsule TAKE 2 CAPSULES(120 MG) BY MOUTH DAILY   fluticasone (FLONASE) 50 MCG/ACT nasal spray SHAKE LIQUID AND USE 2 SPRAYS IN EACH NOSTRIL DAILY   furosemide (LASIX) 20 MG tablet TAKE 1 TABLET(20 MG) BY MOUTH TWICE DAILY   gabapentin (NEURONTIN) 600 MG tablet TAKE 1/2 TO 2 TABLETS(300 TO 1200 MG) BY MOUTH THREE TIMES DAILY AS NEEDED   Insulin Pen Needle (B-D ULTRAFINE III SHORT PEN) 31G X 8 MM MISC USE DAILY WITH VICTOZA PEN   Lancets (ONETOUCH DELICA PLUS [id number] MISC  USE TO TEST BLOOD SUGAR TWICE DAILY (Patient not taking: Reported on 11/16/2019)   metFORMIN (GLUCOPHAGE) 1000 MG tablet TAKE 1 TABLET BY MOUTH TWICE DAILY WITH MEALS   metoprolol succinate (TOPROL-XL) 50 MG 24 hr tablet TAKE 1 TABLET BY MOUTH EVERY DAY WITH OR IMMEDIATELY FOLLOWING A MEAL   neomycin-polymyxin-hydrocortisone (CORTISPORIN) OTIC solution Place 3 drops into the right ear 4 (four) times daily. (Patient not taking: Reported on 02/16/2020)   oxcarbazepine (TRILEPTAL) 600 MG tablet TAKE 1 TABLET(600 MG) BY MOUTH DAILY   pantoprazole (PROTONIX) 40 MG tablet TAKE 1 TABLET(40 MG) BY MOUTH DAILY   predniSONE (DELTASONE) 20 MG tablet 3 tabs po qd x 2 days, 2 tabs po qd x 2 days, 1 tab po qd x 2 days   rosuvastatin (CRESTOR) 20 MG tablet TAKE 1 TABLET BY MOUTH EVERY DAY   triamcinolone cream (KENALOG) 0.1 % Apply 1 application topically 2 (two) times daily.   TRULICITY 1.5 MIW/5.8KDSOPN INJECT 1 PEN INTO THE SKIN ONCE A WEEK   valACYclovir (VALTREX) 500 MG tablet Take 1 tablet (500 mg total) by mouth 2 (two) times daily.   No facility-administered encounter medications on file as of 02/16/2020.     Current Diagnosis/Assessment:  Goals Addressed             This Visit's Progress    Pharmacy Care Plan:       CARE PLAN  ENTRY (see longitudinal plan of care for additional care plan information)  Current Barriers:  Chronic Disease Management support, education, and care coordination needs related to Hypertension, Hyperlipidemia, and Diabetes w/neuropathy   Hypertension BP Readings from Last 3 Encounters:  11/16/19 120/90  02/26/19 140/90  04/24/18 120/90   Pharmacist Clinical Goal(s): Over the next 180 days, patient will work with PharmD and providers to maintain BP goal <130/80 Current regimen:  Metoprolol XL 38m daily Interventions: Reviewed home BP monitoring Discussed recent office BP Encouraged continued physical exercise Patient self care activities - Over the next 180  days, patient will: Check BP periodically, document, and provide at future appointments Ensure daily salt intake < 2300 mg/day  Hyperlipidemia Lab Results  Component Value Date/Time   LDLCALC 104 (H) 11/16/2019 09:36 AM   Pharmacist Clinical Goal(s): Over the next 180 days, patient will work with PharmD and providers to achieve LDL goal < 100 Current regimen:  Rosuvastatin 238mInterventions: Reviewed most recent lipid panel Discussed medication adherence Patient self care activities - Over the next 180 days, patient will: Continue to focus on medication adherence Work on dietary modifications and physical activity  Diabetes Lab Results  Component Value Date/Time   HGBA1C 6.6 (H) 11/16/2019 09:36 AM   HGBA1C 6.6 (H) 02/26/2019 10:51 AM   Pharmacist Clinical Goal(s): Over the next 180 days, patient will work with PharmD and providers to maintain A1c goal <7% Current regimen:  Trulicity 1.5.4YHnce weekly Metformin 100066mwice daily with meals Gabapentin 600m38mree times per day as needed Duloxetine 60 mg two tablets daily Oxcarbazepine 600mg20mly Interventions: Reviewed home blood sugar monitoring Discussed diet and exercise Reviewed most recent A1c Patient self care activities - Over the next 180 days, patient will: Check blood sugar once daily, document, and provide at future appointments Contact provider with any episodes of hypoglycemia Contact PharmD with any cost concerns in the future.   Initial goal documentation         Hypertension   BP goal is:  <130/80  Office blood pressures are  BP Readings from Last 3 Encounters:  11/16/19 120/90  02/26/19 140/90  04/24/18 120/90   Patient checks BP at home infrequently Patient home BP readings are ranging: she reports her blood pressure is always good Patient has failed these meds in the past: none noted Patient is currently controlled on the following medications:  Metoprolol XL 50mg 74my  Patient  denies dizziness, headaches.  Takes daily.  Office BP has been well controlled.  Has no concerns with pulse.  Plan  Continue current medications     Diabetes   A1c goal <7%  Recent Relevant Labs: Lab Results  Component Value Date/Time   HGBA1C 6.6 (H) 11/16/2019 09:36 AM   HGBA1C 6.6 (H) 02/26/2019 10:51 AM   MICROALBUR 1.0 02/26/2019 10:57 AM   MICROALBUR 2.9 10/16/2017 08:40 AM    Last diabetic Eye exam: No results found for: HMDIABEYEEXA  Last diabetic Foot exam: No results found for: HMDIABFOOTEX   Checking BG: 2x per Day  Recent FBG Readings: 115-142 per patient   Patient has failed these meds in past: none ntoed Patient is currently controlled on the following medications: Trulicity 1.5mg 0.6CBweekly Metformin 1000mg b66mith meals Gabapentin 600mg ti29mn  She is checking blood sugar regularly at home.  Denies hypoglycemia, no blood sugars > 200 per patient.  She is currently riding bike and getting about 7,500 steps in per day.  Diet is well balanced, does not  eat many friend foods, limits carbohydrates, and beverages are either water or crystal light.  A1c well controlled.  She wants to lose weight, just encouraged her to continue lifestyle mods and it will come.  Plan  Continue current medications Hyperlipidemia   LDL goal < 100  Lipid Panel     Component Value Date/Time   CHOL 196 11/16/2019 0936   TRIG 204 (H) 11/16/2019 0936   HDL 60 11/16/2019 0936   LDLCALC 104 (H) 11/16/2019 0936    Hepatic Function Latest Ref Rng & Units 11/16/2019 02/26/2019 04/24/2018  Total Protein 6.1 - 8.1 g/dL 7.6 8.1 7.6  Albumin 3.6 - 5.1 g/dL - - -  AST 10 - 35 U/L 42(H) 72(H) 57(H)  ALT 6 - 29 U/L 46(H) 67(H) 76(H)  Alk Phosphatase 33 - 115 U/L - - -  Total Bilirubin 0.2 - 1.2 mg/dL 0.3 0.4 0.3  Bilirubin, Direct 0.0 - 0.3 mg/dL - - -     The 10-year ASCVD risk score Mikey Bussing DC Jr., et al., 2013) is: 2.7%   Values used to calculate the score:     Age: 31 years      Sex: Female     Is Non-Hispanic African American: No     Diabetic: Yes     Tobacco smoker: No     Systolic Blood Pressure: 500 mmHg     Is BP treated: Yes     HDL Cholesterol: 60 mg/dL     Total Cholesterol: 196 mg/dL   Patient has failed these meds in past: none noted Patient is currently controlled on the following medications:  Rosuvastatin 60m  Takes daily, denies myalgias.  LDL borderline, just continue meds and work on diet and exercise.  Plan  Continue current medications  GERD   Patient has failed these meds in past: none noted Patient is currently controlled on the following medications:  Pantoprazole 436mdaily  She denies symptoms of acid reflux.  She is currently working on limiting her trigger foods.  Takes appropriately and will have symptoms without.  Plan  Continue current medications  Vaccines   Reviewed and discussed patient's vaccination history.    Immunization History  Administered Date(s) Administered   Influenza,inj,Quad PF,6+ Mos 02/02/2016, 04/29/2017, 02/06/2018, 02/26/2019   PFIZER SARS-COV-2 Vaccination 07/31/2019, 08/21/2019   Pneumococcal Polysaccharide-23 10/24/2014   Tdap 12/17/2011    Plan  Recommended patient receive Shingrix, COVID booster and flu vaccine. Medication Management   Miscellaneous medications:  Furosemide 2032mwice daily Oxcarbazepine 600m66mily Valacyclovir 500mg26m OTC's:  Biotin, Vit a, Zince, Vit E, Zyrtec [facility]  [phone] reports using pill box method to organize medications and promote adherence. Patient denies missed doses of medication. She does mention many trips to the pharmacy.  Interested in [facility] synchronization and delivery.  Verbal consent obtained for [facility] enhanced pharmacy services (medication synchronization, adherence packaging, delivery coordination). A medication sync plan was created to allow patient to get all  medications delivered once every 30 to 90 days per patient preference. Patient understands they have freedom to choose pharmacy and clinical pharmacist will coordinate care between all prescribers and [facility].   ChrisBeverly MilchrmD Clinical Pharmacist [place])[phone] collaborated with the care management provider regarding care management and care coordination activities outlined in this encounter and have reviewed this encounter including documentation in the note and care plan. I am certifying that I agree with the  content of this note and encounter as supervising physician.

## 2020-02-16 ENCOUNTER — Ambulatory Visit: Payer: Medicare Other | Admitting: Pharmacist

## 2020-02-16 DIAGNOSIS — E0842 Diabetes mellitus due to underlying condition with diabetic polyneuropathy: Secondary | ICD-10-CM

## 2020-02-16 DIAGNOSIS — E1143 Type 2 diabetes mellitus with diabetic autonomic (poly)neuropathy: Secondary | ICD-10-CM

## 2020-02-16 DIAGNOSIS — I10 Essential (primary) hypertension: Secondary | ICD-10-CM

## 2020-02-16 DIAGNOSIS — E785 Hyperlipidemia, unspecified: Secondary | ICD-10-CM

## 2020-02-16 NOTE — Patient Instructions (Addendum)
Visit Information Thank you for meeting with me today!  I look forward to working with you to help you meet all of your healthcare goals and answer any questions you may have.  Feel free to contact me anytime!  Goals Addressed            This Visit's Progress    Pharmacy Care Plan:       CARE PLAN ENTRY (see longitudinal plan of care for additional care plan information)  Current Barriers:   Chronic Disease Management support, education, and care coordination needs related to Hypertension, Hyperlipidemia, and Diabetes w/neuropathy   Hypertension BP Readings from Last 3 Encounters:  11/16/19 120/90  02/26/19 140/90  04/24/18 120/90    Pharmacist Clinical Goal(s): o Over the next 180 days, patient will work with PharmD and providers to maintain BP goal <130/80  Current regimen:  o Metoprolol XL 19m daily  Interventions: o Reviewed home BP monitoring o Discussed recent office BP o Encouraged continued physical exercise  Patient self care activities - Over the next 180 days, patient will: o Check BP periodically, document, and provide at future appointments o Ensure daily salt intake < 2300 mg/day  Hyperlipidemia Lab Results  Component Value Date/Time   LDLCALC 104 (H) 11/16/2019 09:36 AM    Pharmacist Clinical Goal(s): o Over the next 180 days, patient will work with PharmD and providers to achieve LDL goal < 100  Current regimen:  o Rosuvastatin 265m Interventions: o Reviewed most recent lipid panel o Discussed medication adherence  Patient self care activities - Over the next 180 days, patient will: o Continue to focus on medication adherence o Work on dietary modifications and physical activity  Diabetes Lab Results  Component Value Date/Time   HGBA1C 6.6 (H) 11/16/2019 09:36 AM   HGBA1C 6.6 (H) 02/26/2019 10:51 AM    Pharmacist Clinical Goal(s): o Over the next 180 days, patient will work with PharmD and providers to maintain A1c goal  <7%  Current regimen:   Trulicity 1.9.4WHnce weekly  Metformin 100061mwice daily with meals  Gabapentin 600m51mree times per day as needed  Duloxetine 60 mg two tablets daily  Oxcarbazepine 600mg67mly  Interventions: o Reviewed home blood sugar monitoring o Discussed diet and exercise o Reviewed most recent A1c  Patient self care activities - Over the next 180 days, patient will: o Check blood sugar once daily, document, and provide at future appointments o Contact provider with any episodes of hypoglycemia o Contact PharmD with any cost concerns in the future.   Initial goal documentation        [name] information about Chronic Care Management services today including:  1. CCM service includes personalized support from designated clinical staff supervised by her physician, including individualized plan of care and coordination with other care providers 2. 24/7 contact phone numbers for assistance for urgent and routine care needs. 3. Standard insurance, coinsurance, copays and deductibles apply for chronic care management only during months in which we provide at least 20 minutes of these services. Most insurances cover these services at 100%, however patients may be responsible for any copay, coinsurance and/or deductible if applicable. This service may help you avoid the need for more expensive face-to-face services. 4. Only one practitioner may furnish and bill the service in a calendar month. 5. The patient may stop CCM services at any time (effective at the end of the month) by phone call to the office staff.  Patient agreed to  services and verbal consent obtained.   The patient verbalized understanding of instructions provided today and agreed to receive a mailed copy of patient instruction and/or educational materials. Telephone follow up appointment with pharmacy team member scheduled for: 45 months  Jacqueline [name], PharmD Clinical  Pharmacist [facility] [phone]   Preventing High Cholesterol Cholesterol is a white, waxy substance similar to fat that the human body needs to help build cells. The liver makes all the cholesterol that a person's body needs. Having high cholesterol (hypercholesterolemia) increases a person's risk for heart disease and stroke. Extra (excess) cholesterol comes from the food the person eats. High cholesterol can often be prevented with diet and lifestyle changes. If you already have high cholesterol, you can control it with diet and lifestyle changes and with medicine. How can high cholesterol affect me? If you have high cholesterol, deposits (plaques) may build up on the walls of your arteries. The arteries are the blood vessels that carry blood away from your heart. Plaques make the arteries narrower and stiffer. This can limit or block blood flow and cause blood clots to form. Blood clots:  Are tiny balls of cells that form in your blood.  Can move to the heart or brain, causing a heart attack or stroke. Plaques in arteries greatly increase your risk for heart attack and stroke.Making diet and lifestyle changes can reduce your risk for these conditions that may threaten your life. What can increase my risk? This condition is more likely to develop in people who:  Eat foods that are high in saturated fat or cholesterol. Saturated fat is mostly found in: ? Foods that contain animal fat, such as red meat and some dairy products. ? Certain fatty foods made from plants, such as tropical oils.  Are overweight.  Are not getting enough exercise.  Have a family history of high cholesterol. What actions can I take to prevent this? Nutrition   Eat less saturated fat.  Avoid trans fats (partially hydrogenated oils). These are often found in margarine and in some baked goods, fried foods, and snacks bought in packages.  Avoid precooked or cured meat, such as sausages  or meat loaves.  Avoid foods and drinks that have added sugars.  Eat more fruits, vegetables, and whole grains.  Choose healthy sources of protein, such as fish, poultry, lean cuts of red meat, beans, peas, lentils, and nuts.  Choose healthy sources of fat, such as: ? Nuts. ? Vegetable oils, especially olive oil. ? Fish that have healthy fats (omega-3 fatty acids), such as mackerel or salmon. The items listed above may not be a complete list of recommended foods and beverages. Contact a dietitian for more information. Lifestyle  Lose weight if you are overweight. Losing 5-10 lb (2.3-4.5 kg) can help prevent or control high cholesterol. It can also lower your risk for diabetes and high blood pressure. Ask your health care provider to help you with a diet and exercise plan to lose weight safely.  Do not use any products that contain nicotine or tobacco, such as cigarettes, e-cigarettes, and chewing tobacco. If you need help quitting, ask your health care provider.  Limit your alcohol intake. ? Do not drink alcohol if:  Your health care provider tells you not to drink.  You are pregnant, may be pregnant, or are planning to become pregnant. ? If you drink alcohol:  Limit how much you use to:  0-1 drink a day for women.  0-2 drinks a day  for men.  Be aware of how much alcohol is in your drink. In the [place] one drink equals one 12 oz bottle of beer (355 mL), one 5 oz glass of wine (148 mL), or one 1 oz glass of hard liquor (44 mL). Activity   Get enough exercise. Each week, do at least 150 minutes of exercise that takes a medium level of effort (moderate-intensity exercise). ? This is exercise that:  Makes your heart beat faster and makes you breathe harder than usual.  Allows you to still be able to talk. ? You could exercise in short sessions several times a day or longer sessions a few times a week. For example, on 5 days each week, you could walk fast or ride your bike 3  times a day for 10 minutes each time.  Do exercises as told by your health care provider. Medicines  In addition to diet and lifestyle changes, your health care provider may recommend medicines to help lower cholesterol. This may be a medicine to lower the amount of cholesterol your liver makes. You may need medicine if: ? Diet and lifestyle changes do not lower your cholesterol enough. ? You have high cholesterol and other risk factors for heart disease or stroke.  Take over-the-counter and prescription medicines only as told by your health care provider. General information  Manage your risk factors for high cholesterol. Talk with your health care provider about all your risk factors and how to lower your risk.  Manage other conditions that you have, such as diabetes or high blood pressure (hypertension).  Have blood tests to check your cholesterol levels at regular points in time as told by your health care provider.  Keep all follow-up visits as told by your health care provider. This is important. Where to find more information  American Heart Association: www.heart.org  National Heart, Lung, and Blood Institute: https://wilson-eaton.com/ Summary  High cholesterol increases your risk for heart disease and stroke. By keeping your cholesterol level low, you can reduce your risk for these conditions.  High cholesterol can often be prevented with diet and lifestyle changes.  Work with your health care provider to manage your risk factors, and have your blood tested regularly. This information is not intended to replace advice given to you by your health care provider. Make sure you discuss any questions you have with your health care provider. Document Revised: 08/21/2018 Document Reviewed: 01/06/2016 Elsevier Patient Education  2020 [employer].

## 2020-02-23 ENCOUNTER — Other Ambulatory Visit: Payer: Self-pay | Admitting: *Deleted

## 2020-02-23 DIAGNOSIS — E0842 Diabetes mellitus due to underlying condition with diabetic polyneuropathy: Secondary | ICD-10-CM

## 2020-02-23 DIAGNOSIS — E785 Hyperlipidemia, unspecified: Secondary | ICD-10-CM

## 2020-02-23 DIAGNOSIS — E1169 Type 2 diabetes mellitus with other specified complication: Secondary | ICD-10-CM

## 2020-02-23 DIAGNOSIS — E1143 Type 2 diabetes mellitus with diabetic autonomic (poly)neuropathy: Secondary | ICD-10-CM

## 2020-02-23 DIAGNOSIS — I10 Essential (primary) hypertension: Secondary | ICD-10-CM

## 2020-02-24 ENCOUNTER — Ambulatory Visit: Payer: Medicare Other | Attending: Internal Medicine

## 2020-02-24 DIAGNOSIS — Z23 Encounter for immunization: Secondary | ICD-10-CM

## 2020-02-24 NOTE — Progress Notes (Signed)
   Covid-[facility] Vaccination [facility]  Name:  Jacqueline [name]    MRN: [id number] DOB: 1965-11-07  02/24/2020  Jacqueline [name] was observed post Covid-19 immunization for 15 minutes without incident. She was provided with Vaccine Information Sheet and instruction to access the V-Safe system.   Jacqueline [name] was instructed to call 911 with any severe reactions post vaccine: Marland Kitchen Difficulty breathing  . Swelling of face and throat  . A fast heartbeat  . A bad rash all over body  . Dizziness and weakness

## 2020-02-25 DIAGNOSIS — I10 Essential (primary) hypertension: Secondary | ICD-10-CM | POA: Diagnosis not present

## 2020-02-25 DIAGNOSIS — H2513 Age-related nuclear cataract, bilateral: Secondary | ICD-10-CM | POA: Diagnosis not present

## 2020-02-25 DIAGNOSIS — G932 Benign intracranial hypertension: Secondary | ICD-10-CM | POA: Diagnosis not present

## 2020-02-28 ENCOUNTER — Other Ambulatory Visit: Payer: Self-pay

## 2020-02-28 ENCOUNTER — Other Ambulatory Visit: Payer: Medicare Other

## 2020-02-28 DIAGNOSIS — E1169 Type 2 diabetes mellitus with other specified complication: Secondary | ICD-10-CM | POA: Diagnosis not present

## 2020-02-28 DIAGNOSIS — Z1159 Encounter for screening for other viral diseases: Secondary | ICD-10-CM

## 2020-02-28 DIAGNOSIS — E785 Hyperlipidemia, unspecified: Secondary | ICD-10-CM | POA: Diagnosis not present

## 2020-02-28 DIAGNOSIS — I10 Essential (primary) hypertension: Secondary | ICD-10-CM | POA: Diagnosis not present

## 2020-02-28 DIAGNOSIS — E1143 Type 2 diabetes mellitus with diabetic autonomic (poly)neuropathy: Secondary | ICD-10-CM | POA: Diagnosis not present

## 2020-02-29 LAB — CBC WITH DIFFERENTIAL/PLATELET
Absolute Monocytes: 469 cells/uL (ref 200–950)
Basophils Absolute: 7 cells/uL (ref 0–200)
Basophils Relative: 0.1 %
Eosinophils Absolute: 173 cells/uL (ref 15–500)
Eosinophils Relative: 2.5 %
HCT: 39.9 % (ref 35.0–45.0)
Hemoglobin: 13.4 g/dL (ref 11.7–15.5)
Lymphs Abs: 2732 cells/uL (ref 850–3900)
MCH: 30 pg (ref 27.0–33.0)
MCHC: 33.6 g/dL (ref 32.0–36.0)
MCV: 89.3 fL (ref 80.0–100.0)
MPV: 10.6 fL (ref 7.5–12.5)
Monocytes Relative: 6.8 %
Neutro Abs: 3519 cells/uL (ref 1500–7800)
Neutrophils Relative %: 51 %
Platelets: 339 10*3/uL (ref 140–400)
RBC: 4.47 10*6/uL (ref 3.80–5.10)
RDW: 12.9 % (ref 11.0–15.0)
Total Lymphocyte: 39.6 %
WBC: 6.9 10*3/uL (ref 3.8–10.8)

## 2020-02-29 LAB — COMPLETE METABOLIC PANEL WITH GFR
AG Ratio: 1.8 (calc) (ref 1.0–2.5)
ALT: 53 U/L — ABNORMAL HIGH (ref 6–29)
AST: 51 U/L — ABNORMAL HIGH (ref 10–35)
Albumin: 4.5 g/dL (ref 3.6–5.1)
Alkaline phosphatase (APISO): 77 U/L (ref 37–153)
BUN: 12 mg/dL (ref 7–25)
CO2: 26 mmol/L (ref 20–32)
Calcium: 10.3 mg/dL (ref 8.6–10.4)
Chloride: 97 mmol/L — ABNORMAL LOW (ref 98–110)
Creat: 0.71 mg/dL (ref 0.50–1.05)
GFR, Est African American: 114 mL/min/{1.73_m2} (ref >=60)
GFR, Est Non African American: 99 mL/min/{1.73_m2} (ref >=60)
Globulin: 2.5 g/dL (calc) (ref 1.9–3.7)
Glucose, Bld: 112 mg/dL — ABNORMAL HIGH (ref 65–99)
Potassium: 4.8 mmol/L (ref 3.5–5.3)
Sodium: 139 mmol/L (ref 135–146)
Total Bilirubin: 0.4 mg/dL (ref 0.2–1.2)
Total Protein: 7 g/dL (ref 6.1–8.1)

## 2020-02-29 LAB — LIPID PANEL
Cholesterol: 182 mg/dL (ref <200)
HDL: 46 mg/dL — ABNORMAL LOW (ref >=50)
LDL Cholesterol (Calc): 103 mg/dL (calc) — ABNORMAL HIGH
Non-HDL Cholesterol (Calc): 136 mg/dL (calc) — ABNORMAL HIGH (ref <130)
Total CHOL/HDL Ratio: 4 (calc) (ref <5.0)
Triglycerides: 209 mg/dL — ABNORMAL HIGH (ref <150)

## 2020-02-29 LAB — HEPATITIS C ANTIBODY
Hepatitis C Ab: NONREACTIVE
SIGNAL TO CUT-OFF: 0.01 (ref <1.00)

## 2020-02-29 LAB — HEMOGLOBIN A1C
Hgb A1c MFr Bld: 6.5 % of total Hgb — ABNORMAL HIGH (ref <5.7)
Mean Plasma Glucose: 140 (calc)
eAG (mmol/L): 7.7 (calc)

## 2020-02-29 LAB — MICROALBUMIN / CREATININE URINE RATIO
Creatinine, Urine: 128 mg/dL (ref 20–275)
Microalb Creat Ratio: 21 mcg/mg creat (ref <30)
Microalb, Ur: 2.7 mg/dL

## 2020-03-01 ENCOUNTER — Other Ambulatory Visit: Payer: Self-pay

## 2020-03-01 MED ORDER — PANTOPRAZOLE SODIUM 40 MG PO TBEC
DELAYED_RELEASE_TABLET | ORAL | 3 refills | Status: DC
Start: 2020-03-01 — End: 2021-04-19

## 2020-03-01 MED ORDER — TRULICITY 1.5 MG/0.5ML ~~LOC~~ SOAJ
SUBCUTANEOUS | 3 refills | Status: DC
Start: 2020-03-01 — End: 2020-03-31

## 2020-03-01 MED ORDER — OXCARBAZEPINE 600 MG PO TABS
ORAL_TABLET | ORAL | 3 refills | Status: DC
Start: 2020-03-01 — End: 2021-01-22

## 2020-03-01 MED ORDER — ROSUVASTATIN CALCIUM 20 MG PO TABS
20.0000 mg | ORAL_TABLET | Freq: Every day | ORAL | 3 refills | Status: DC
Start: 2020-03-01 — End: 2021-01-22

## 2020-03-01 MED ORDER — ONETOUCH DELICA PLUS LANCET33G MISC: 3 refills | Status: DC

## 2020-03-01 MED ORDER — METFORMIN HCL 1000 MG PO TABS
1000.0000 mg | ORAL_TABLET | Freq: Two times a day (BID) | ORAL | 2 refills | Status: DC
Start: 2020-03-01 — End: 2020-10-27

## 2020-03-01 MED ORDER — DULOXETINE HCL 60 MG PO CPEP: ORAL_CAPSULE | ORAL | 3 refills | Status: DC

## 2020-03-01 MED ORDER — FLUTICASONE PROPIONATE 50 MCG/ACT NA SUSP: NASAL | 6 refills | Status: DC

## 2020-03-01 MED ORDER — FUROSEMIDE 20 MG PO TABS
ORAL_TABLET | ORAL | 3 refills | Status: DC
Start: 2020-03-01 — End: 2020-04-10

## 2020-03-01 MED ORDER — METOPROLOL SUCCINATE ER 50 MG PO TB24
ORAL_TABLET | ORAL | 1 refills | Status: DC
Start: 2020-03-01 — End: 2020-07-31

## 2020-03-01 MED ORDER — GABAPENTIN 600 MG PO TABS
ORAL_TABLET | ORAL | 3 refills | Status: DC
Start: 2020-03-01 — End: 2020-07-31

## 2020-03-01 MED ORDER — ACCU-CHEK GUIDE VI STRP: ORAL_STRIP | 1 refills | Status: DC

## 2020-03-02 ENCOUNTER — Ambulatory Visit (INDEPENDENT_AMBULATORY_CARE_PROVIDER_SITE_OTHER): Payer: Medicare Other | Admitting: Family Medicine

## 2020-03-02 ENCOUNTER — Encounter (INDEPENDENT_AMBULATORY_CARE_PROVIDER_SITE_OTHER): Payer: Self-pay | Admitting: *Deleted

## 2020-03-02 ENCOUNTER — Other Ambulatory Visit: Payer: Self-pay

## 2020-03-02 ENCOUNTER — Encounter: Payer: Self-pay | Admitting: Family Medicine

## 2020-03-02 VITALS — BP 126/80 | HR 91 | Temp 97.9°F | Resp 12 | Ht 62.0 in | Wt 190.0 lb

## 2020-03-02 DIAGNOSIS — Z23 Encounter for immunization: Secondary | ICD-10-CM | POA: Diagnosis not present

## 2020-03-02 DIAGNOSIS — E0842 Diabetes mellitus due to underlying condition with diabetic polyneuropathy: Secondary | ICD-10-CM

## 2020-03-02 DIAGNOSIS — E785 Hyperlipidemia, unspecified: Secondary | ICD-10-CM

## 2020-03-02 DIAGNOSIS — Z1211 Encounter for screening for malignant neoplasm of colon: Secondary | ICD-10-CM

## 2020-03-02 DIAGNOSIS — I10 Essential (primary) hypertension: Secondary | ICD-10-CM

## 2020-03-02 DIAGNOSIS — E1143 Type 2 diabetes mellitus with diabetic autonomic (poly)neuropathy: Secondary | ICD-10-CM

## 2020-03-02 DIAGNOSIS — Z780 Asymptomatic menopausal state: Secondary | ICD-10-CM | POA: Diagnosis not present

## 2020-03-02 DIAGNOSIS — K7581 Nonalcoholic steatohepatitis (NASH): Secondary | ICD-10-CM

## 2020-03-02 DIAGNOSIS — E1169 Type 2 diabetes mellitus with other specified complication: Secondary | ICD-10-CM | POA: Diagnosis not present

## 2020-03-02 DIAGNOSIS — Z1231 Encounter for screening mammogram for malignant neoplasm of breast: Secondary | ICD-10-CM

## 2020-03-02 NOTE — Progress Notes (Signed)
Subjective:    Patient ID: Jacqueline [name], female    DOB: Feb 13, 1966, 54 y.o.   MRN: [id number]  HPI  Patient is a very sweet 54 year old Caucasian female here today for complete physical exam.  Past medical history is significant for type 2 diabetes mellitus, nonalcoholic steatohepatitis, dyslipidemia.  Overall she is doing well.  She denies any concerns today.  Reviewing her most recent immunizations, she is due for a flu shot today which she received here.  She is also due for a booster on Pneumovax 23 as her last dose was 5 years ago.  She defers this at the present time.  She is already had her third Covid booster.  She is due for a shingles vaccine but she defers this at the present time as well.  Regarding her cancer screening, the patient had a colonoscopy in 2011.  She is due again.  She request that I schedule this.  Her last mammogram was in 2020.  She would like me to schedule this as well.  She does have a history of a total abdominal hysterectomy.  This was performed at 54 years old due to endometriosis and ovarian cyst.  Therefore she has a longstanding history of premature menopause.  She has not had a bone density test.  This is overdue.  Her most recent lab work is listed below and does show mild chronic underlying liver inflammation but otherwise her A1c and cholesterol look pretty good.  A1c is acceptable at 6.5.  Cholesterol slightly elevated.  Ideally like her LDL cholesterol to be below 100 and her HDL cholesterol to be above 50.  She admits that she is not exercising. Immunization History  Administered Date(s) Administered  . Influenza,inj,Quad PF,6+ Mos 02/02/2016, 04/29/2017, 02/06/2018, 02/26/2019, 03/02/2020  . PFIZER SARS-COV-2 Vaccination 07/31/2019, 08/21/2019, 02/24/2020  . Pneumococcal Polysaccharide-23 10/24/2014  . Tdap 12/17/2011   Lab on 02/28/2020  Component Date Value Ref Range Status  . Glucose, Bld 02/28/2020 112* 65 - 99 mg/dL Final   Comment: .             Fasting reference interval . For someone without known diabetes, a glucose value between 100 and 125 mg/dL is consistent with prediabetes and should be confirmed with a follow-up test. .   . BUN 02/28/2020 12  7 - 25 mg/dL Final  . Creat 02/28/2020 0.71  0.50 - 1.05 mg/dL Final   Comment: For patients >4 years of age, the reference limit for Creatinine is approximately 13% higher for people identified as African-American. .   . GFR, Est Non African American 02/28/2020 99  > OR = 60 mL/min/1.40m Final  . GFR, Est African American 02/28/2020 114  > OR = 60 mL/min/1.768mFinal  . BUN/Creatinine Ratio 1001/74/9449OT APPLICABLE  6 - 22 (calc) Final  . Sodium 02/28/2020 139  135 - 146 mmol/L Final  . Potassium 02/28/2020 4.8  3.5 - 5.3 mmol/L Final  . Chloride 02/28/2020 97* 98 - 110 mmol/L Final  . CO2 02/28/2020 26  20 - 32 mmol/L Final  . Calcium 02/28/2020 10.3  8.6 - 10.4 mg/dL Final  . Total Protein 02/28/2020 7.0  6.1 - 8.1 g/dL Final  . Albumin 02/28/2020 4.5  3.6 - 5.1 g/dL Final  . Globulin 02/28/2020 2.5  1.9 - 3.7 g/dL (calc) Final  . AG Ratio 02/28/2020 1.8  1.0 - 2.5 (calc) Final  . Total Bilirubin 02/28/2020 0.4  0.2 - 1.2 mg/dL Final  . Alkaline phosphatase (APISO)  02/28/2020 77  37 - 153 U/L Final  . AST 02/28/2020 51* 10 - 35 U/L Final  . ALT 02/28/2020 53* 6 - 29 U/L Final  . WBC 02/28/2020 6.9  3.8 - 10.8 Thousand/uL Final  . RBC 02/28/2020 4.47  3.80 - 5.10 Million/uL Final  . Hemoglobin 02/28/2020 13.4  11.7 - 15.5 g/dL Final  . HCT 02/28/2020 39.9  35 - 45 % Final  . MCV 02/28/2020 89.3  80.0 - 100.0 fL Final  . MCH 02/28/2020 30.0  27.0 - 33.0 pg Final  . MCHC 02/28/2020 33.6  32.0 - 36.0 g/dL Final  . RDW 02/28/2020 12.9  11.0 - 15.0 % Final  . Platelets 02/28/2020 339  140 - 400 Thousand/uL Final  . MPV 02/28/2020 10.6  7.5 - 12.5 fL Final  . Neutro Abs 02/28/2020 3,519  1,500 - 7,800 cells/uL Final  . Lymphs Abs 02/28/2020 2,732  850 - 3,900 cells/uL  Final  . Absolute Monocytes 02/28/2020 469  200 - 950 cells/uL Final  . Eosinophils Absolute 02/28/2020 173  15.0 - 500.0 cells/uL Final  . Basophils Absolute 02/28/2020 7  0.0 - 200.0 cells/uL Final  . Neutrophils Relative % 02/28/2020 51  % Final  . Total Lymphocyte 02/28/2020 39.6  % Final  . Monocytes Relative 02/28/2020 6.8  % Final  . Eosinophils Relative 02/28/2020 2.5  % Final  . Basophils Relative 02/28/2020 0.1  % Final  . Hgb A1c MFr Bld 02/28/2020 6.5* <5.7 % of total Hgb Final   Comment: For someone without known diabetes, a hemoglobin A1c value of 6.5% or greater indicates that they may have  diabetes and this should be confirmed with a follow-up  test. . For someone with known diabetes, a value <7% indicates  that their diabetes is well controlled and a value  greater than or equal to 7% indicates suboptimal  control. A1c targets should be individualized based on  duration of diabetes, age, comorbid conditions, and  other considerations. . Currently, no consensus exists regarding use of hemoglobin A1c for diagnosis of diabetes for children. .   . Mean Plasma Glucose 02/28/2020 140  (calc) Final  . eAG (mmol/L) 02/28/2020 7.7  (calc) Final  . Cholesterol 02/28/2020 182  <200 mg/dL Final  . HDL 02/28/2020 46* > OR = 50 mg/dL Final  . Triglycerides 02/28/2020 209* <150 mg/dL Final   Comment: . If a non-fasting specimen was collected, consider repeat triglyceride testing on a fasting specimen if clinically indicated.  Yates Decamp et al. J. of Clin. Lipidol. 1610;9:604-540. .   . LDL Cholesterol (Calc) 02/28/2020 103* mg/dL (calc) Final   Comment: Reference range: <100 . Desirable range <100 mg/dL for primary prevention;   <70 mg/dL for patients with CHD or diabetic patients  with > or = 2 CHD risk factors. Marland Kitchen LDL-C is now calculated using the Martin-Hopkins  calculation, which is a validated novel method providing  better accuracy than the Friedewald equation in  the  estimation of LDL-C.  Cresenciano Genre et al. Annamaria Helling. 9811;914(78): 2061-2068  (http://education.[facility].com/faq/FAQ164)   . Total CHOL/HDL Ratio 02/28/2020 4.0  <5.0 (calc) Final  . Non-HDL Cholesterol (Calc) 02/28/2020 136* <130 mg/dL (calc) Final   Comment: For patients with diabetes plus 1 major ASCVD risk  factor, treating to a non-HDL-C goal of <100 mg/dL  (LDL-C of <70 mg/dL) is considered a therapeutic  option.   . Hepatitis C Ab 02/28/2020 NON-REACTIVE  NON-REACTI Final  . SIGNAL TO CUT-OFF 02/28/2020 0.01  <1.00 Final  Comment: . HCV antibody was non-reactive. There is no laboratory  evidence of HCV infection. . In most cases, no further action is required. However, if recent HCV exposure is suspected, a test for HCV RNA (test code [phone]) is suggested. . For additional information please refer to http://education.[facility].com/faq/FAQ22v1 (This link is being provided for informational/ educational purposes only.) .   Marland Kitchen Creatinine, Urine 02/28/2020 128  20 - 275 mg/dL Final  . Microalb, Ur 02/28/2020 2.7  mg/dL Final   Comment: Reference Range Not established   . Microalb Creat Ratio 02/28/2020 21  <30 mcg/mg creat Final   Comment: . The ADA defines abnormalities in albumin excretion as follows: Marland Kitchen Albuminuria Category        Result (mcg/mg creatinine) . Normal to Mildly increased   <30 Moderately increased         30-299  Severely increased           > OR = 300 . The ADA recommends that at least two of three specimens collected within a 3-6 month period be abnormal before considering a patient to be within a diagnostic category.     Past Medical History:  Diagnosis Date  . Abnormal transaminases   . Allergy    [facility]reesia 03/01/2020  . Anemia   . Anxiety disorder   . Arthritis   . Asthma   . Cancer ([place])    [facility]reesia 03/01/2020  . Cellulitis   . Chronic headaches   . Diabetes mellitus without complication ([place])    [facility]reesia 03/01/2020   . Diverticulitis   . DM (diabetes mellitus) ([place])   . Erosive esophagitis   . Fatty liver   . GERD (gastroesophageal reflux disease)   . Hyperlipidemia    [facility]ia 03/01/2020  . IBS (irritable bowel syndrome)   . Kidney stones   . Neuromuscular disorder ([place])    [facility]reesia 03/01/2020  . Obesity   . Pneumonia   . Pseudotumor cerebri   . Sinus tachycardia    Past Surgical History:  Procedure Laterality Date  . ABDOMINAL HYSTERECTOMY N/A    [facility] 03/01/2020  . ANKLE SURGERY    . COLONOSCOPY  06/19/2009  . EGD  06/09/2009  . FOOT SURGERY    . NASAL SEPTUM SURGERY    . ovarian cysts     x3  . TONSILLECTOMY    . TUBAL LIGATION    . VAGINAL HYSTERECTOMY     Current Outpatient Medications on File Prior to Visit  Medication Sig Dispense Refill  . aspirin EC 81 MG tablet Take 81 mg by mouth daily.    . Beta Carotene (VITAMIN A) 25000 UNIT capsule Take 25,000 Units by mouth daily.    . Blood Glucose Monitoring Suppl (BLOOD GLUCOSE SYSTEM PAK) KIT Please dispense based on patient and insurance preference. Use as directed to monitor FSBS 2x daily. Dx: E11.9. 1 kit 1  . cetirizine (ZYRTEC) 10 MG tablet Take 10 mg by mouth daily.    . Cyanocobalamin (VITAMIN B 12 PO) Take by mouth.    . doxycycline (VIBRA-TABS) 100 MG tablet Take 1 tablet (100 mg total) by mouth 2 (two) times daily. 14 tablet 0  . Dulaglutide (TRULICITY) 1.5 UE/4.5WU SOPN INJECT 1 PEN INTO THE SKIN ONCE A WEEK 2 mL 3  . DULoxetine (CYMBALTA) 60 MG capsule TAKE 2 CAPSULES(120 MG) BY MOUTH DAILY 60 capsule 3  . fluticasone (FLONASE) 50 MCG/ACT nasal spray SHAKE LIQUID AND USE 2 SPRAYS IN EACH NOSTRIL DAILY 16 g 6  .  furosemide (LASIX) 20 MG tablet TAKE 1 TABLET(20 MG) BY MOUTH TWICE DAILY 180 tablet 3  . gabapentin (NEURONTIN) 600 MG tablet TAKE 1/2 TO 2 TABLETS(300 TO 1200 MG) BY MOUTH THREE TIMES DAILY AS NEEDED 180 tablet 3  . glucose blood (ACCU-CHEK GUIDE) test strip USE TO CHECK FASTING BLOOD SUGAR TWICE DAILY 100  strip 1  . Lancets (ONETOUCH DELICA PLUS [id number]) MISC USE TO TEST BLOOD SUGAR TWICE DAILY 100 each 3  . metFORMIN (GLUCOPHAGE) 1000 MG tablet Take 1 tablet (1,000 mg total) by mouth 2 (two) times daily with a meal. 180 tablet 2  . metoprolol succinate (TOPROL-XL) 50 MG 24 hr tablet Take with or immediately following a meal. 90 tablet 1  . neomycin-polymyxin-hydrocortisone (CORTISPORIN) OTIC solution Place 3 drops into the right ear 4 (four) times daily. 10 mL 0  . oxcarbazepine (TRILEPTAL) 600 MG tablet TAKE 1 TABLET(600 MG) BY MOUTH DAILY 90 tablet 3  . pantoprazole (PROTONIX) 40 MG tablet TAKE 1 TABLET(40 MG) BY MOUTH DAILY 90 tablet 3  . rosuvastatin (CRESTOR) 20 MG tablet Take 1 tablet (20 mg total) by mouth daily. 90 tablet 3  . triamcinolone cream (KENALOG) 0.1 % Apply 1 application topically 2 (two) times daily. 30 g 0  . valACYclovir (VALTREX) 500 MG tablet Take 1 tablet (500 mg total) by mouth 2 (two) times daily. 6 tablet 2  . vitamin E 1000 UNIT capsule Take 1,000 Units by mouth daily.    Marland Kitchen zinc gluconate 50 MG tablet Take 50 mg by mouth daily.    . Cinnamon 500 MG capsule Take 1,000 mg by mouth daily.    . Insulin Pen Needle (B-D ULTRAFINE III SHORT PEN) 31G X 8 MM MISC USE DAILY WITH VICTOZA PEN 100 each 5  . predniSONE (DELTASONE) 20 MG tablet 3 tabs po qd x 2 days, 2 tabs po qd x 2 days, 1 tab po qd x 2 days 12 tablet 0   No current facility-administered medications on file prior to visit.   Allergies  Allergen Reactions  . Bee Pollen Anaphylaxis    BEE STINGS  . Other Hives  . Aspirin Hives, Itching and Swelling  . Naproxen Sodium Other (See Comments)    All pain meds, has fatty liver  . Reglan [Metoclopramide] Hives   Social History   Socioeconomic History  . Marital status: Married    Spouse name: Not on file  . Number of children: 3  . Years of education: Not on file  . Highest education level: Not on file  Occupational History  . Occupation: Disabled   Tobacco Use  . Smoking status: Former Research scientist (life sciences)  . Smokeless tobacco: Never Used  Vaping Use  . Vaping Use: Never used  Substance and Sexual Activity  . Alcohol use: No  . Drug use: No  . Sexual activity: Not on file  Other Topics Concern  . Not on file  Social History Narrative  . Not on file   Social Determinants of Health   Financial Resource Strain: Low Risk   . Difficulty of Paying Living Expenses: Not very hard  Food Insecurity:   . Worried About Charity fundraiser in the Last Year: Not on file  . Ran Out of Food in the Last Year: Not on file  Transportation Needs:   . Lack of Transportation (Medical): Not on file  . Lack of Transportation (Non-Medical): Not on file  Physical Activity:   . Days of Exercise per Week: Not on  file  . Minutes of Exercise per Session: Not on file  Stress:   . Feeling of Stress : Not on file  Social Connections:   . Frequency of Communication with Friends and Family: Not on file  . Frequency of Social Gatherings with Friends and Family: Not on file  . Attends Religious Services: Not on file  . Active Member of Clubs or Organizations: Not on file  . Attends Archivist Meetings: Not on file  . Marital Status: Not on file  Intimate Partner Violence:   . Fear of Current or Ex-Partner: Not on file  . Emotionally Abused: Not on file  . Physically Abused: Not on file  . Sexually Abused: Not on file   Family History  Problem Relation Age of Onset  . Colitis Other   . Crohn's disease Other   . Breast cancer Other   . Ovarian cancer Other   . Celiac disease Other   . Clotting disorder Other   . Cystic fibrosis Other        pat. cousin  . Diabetes Mother   . Heart disease Other   . Irritable bowel syndrome Other   . Kidney failure Other 31       mat. nephew  . Diabetes Maternal Aunt   . Diabetes Son   . Diabetes Other        mat. nephew  . Colon cancer Neg Hx       Review of Systems  All other systems reviewed and are  negative.      Objective:   Physical Exam Vitals reviewed.  Constitutional:      General: She is not in acute distress.    Appearance: She is well-developed. She is not diaphoretic.  HENT:     Head: Normocephalic and atraumatic.     Right Ear: External ear normal.     Left Ear: External ear normal.     Nose: Nose normal.     Mouth/Throat:     Pharynx: No oropharyngeal exudate.  Eyes:     General: No scleral icterus.       Right eye: No discharge.        Left eye: No discharge.     Conjunctiva/sclera: Conjunctivae normal.     Pupils: Pupils are equal, round, and reactive to light.  Neck:     Thyroid: No thyromegaly.     Vascular: No JVD.  Cardiovascular:     Rate and Rhythm: Normal rate and regular rhythm.     Heart sounds: Normal heart sounds. No murmur heard.  No friction rub. No gallop.   Pulmonary:     Effort: Pulmonary effort is normal. No respiratory distress.     Breath sounds: Normal breath sounds. No stridor. No wheezing or rales.  Chest:     Chest wall: No tenderness.  Abdominal:     General: Bowel sounds are normal. There is no distension.     Palpations: Abdomen is soft. There is no mass.     Tenderness: There is no abdominal tenderness. There is no guarding or rebound.  Musculoskeletal:        General: No tenderness. Normal range of motion.     Cervical back: Normal range of motion and neck supple.  Lymphadenopathy:     Cervical: No cervical adenopathy.  Skin:    General: Skin is warm.     Coloration: Skin is not pale.     Findings: No erythema or rash.  Neurological:  Mental Status: She is alert and oriented to person, place, and time.     Cranial Nerves: No cranial nerve deficit.     Motor: No abnormal muscle tone.     Coordination: Coordination normal.     Deep Tendon Reflexes: Reflexes are normal and symmetric.  Psychiatric:        Behavior: Behavior normal.        Thought Content: Thought content normal.        Judgment: Judgment normal.            Assessment & Plan:  Need for immunization against influenza - Plan: Flu Vaccine QUAD 36+ mos IM  Encounter for screening mammogram for malignant neoplasm of breast - Plan: MM Digital Screening  Colon cancer screening - Plan: Ambulatory referral to Gastroenterology  Postmenopausal estrogen deficiency - Plan: DG Bone Density  Benign essential HTN  Dyslipidemia associated with type 2 diabetes mellitus ([place])  Diabetic polyneuropathy associated with diabetes mellitus due to underlying condition ([place])  Type 2 diabetes mellitus with diabetic autonomic neuropathy, without long-term current use of insulin ([facility])  NASH (nonalcoholic steatohepatitis)  Diabetic foot exam was performed today and is normal.  Diabetic eye exam was performed earlier this month and was normal.  Patient received her flu shot today.  Covid vaccine is up-to-date.  I did recommend a shingles shot as well as a booster on Pneumovax 23 but she politely declines this at the present time.  She is overdue for colonoscopy so I will schedule her for this.  She also has a history of premature menopause at age 57 therefore she requires a bone density test.  I will schedule this for the patient as well.  Lab work is significant for mild elevations in her AST and ALT consistent with nonalcoholic steatohepatitis.  Her HDL cholesterol is slightly low and her LDL cholesterol slightly high.  I feel that all these issues could be addressed with 30 minutes a day of aerobic exercise and 15 to 20 pounds of weight loss which I politely encouraged her to attempt to achieve.  Diabetes is well controlled with an A1c of 6.5.  Regular anticipatory guidance is provided.  Blood pressure is outstanding.

## 2020-03-03 ENCOUNTER — Other Ambulatory Visit: Payer: Self-pay | Admitting: Family Medicine

## 2020-03-09 ENCOUNTER — Telehealth: Payer: Self-pay | Admitting: Pharmacist

## 2020-03-09 NOTE — Chronic Care Management (AMB) (Signed)
Patient called to say they are running out of Cymbalta.  [facility] will [facility] that today as her first delivery to begin the synchronization process.  Beverly Milch, PharmD Clinical Pharmacist [place] [phone]

## 2020-03-23 ENCOUNTER — Telehealth (INDEPENDENT_AMBULATORY_CARE_PROVIDER_SITE_OTHER): Payer: Self-pay

## 2020-03-23 ENCOUNTER — Other Ambulatory Visit (INDEPENDENT_AMBULATORY_CARE_PROVIDER_SITE_OTHER): Payer: Self-pay

## 2020-03-23 ENCOUNTER — Encounter (INDEPENDENT_AMBULATORY_CARE_PROVIDER_SITE_OTHER): Payer: Self-pay

## 2020-03-23 DIAGNOSIS — Z1211 Encounter for screening for malignant neoplasm of colon: Secondary | ICD-10-CM

## 2020-03-23 NOTE — Telephone Encounter (Signed)
Referring MD/PCP: Pickard   Procedure: Tcs w/mac  Reason/Indication:  Screening  Has patient had this procedure before? Yes, 10 yrs ago  If so, when, by whom and where?    Is there a family history of colon cancer?  no  Who?  What age when diagnosed?    Is patient diabetic?   yes      Does patient have prosthetic heart valve or mechanical valve?  no  Do you have a pacemaker/defibrillator?  no  Has patient ever had endocarditis/atrial fibrillation? no  Does patient use oxygen? no  Has patient had joint replacement within last 12 months?  no  Is patient constipated or do they take laxatives? no  Does patient have a history of alcohol/drug use?  no  Is patient on blood thinner such as Coumadin, Plavix and/or Aspirin? yes  Medications: Trulicity 1.5 mg once weekly, Metoprolol 43m daily, Oxcarazepine 600 mg daily, Rosuvastatin 262mdaily, Fluticasone 5025maily, Triamcinolone Cream prn, Metformin 1000m2md, duloxetine 60 mg bid, Gabapentin 600 mg TID, Pantoprazole 40 mg daily, Furosemide 20mg82m, Valacyclovir 500 mg bid, Bit B12 daily, ASA 81 mg daily, Vit D 3 daily, Epipen prn, Flax seed oil daily, Vit E daily, Biotin Daily, Zinc Daily, vit C daily, Zyrtec Daily  Allergies: Naproxen, Reglan  Medication Adjustment per Dr CastaJenetta Downer Diabetic Medication the evening before and the morning of procedure  Procedure date & time: 03/29/20 at 9:00

## 2020-03-24 MED ORDER — SUPREP BOWEL PREP KIT 17.5-3.13-1.6 GM/177ML PO SOLN: 1.0000 | ORAL | 0 refills | Status: DC

## 2020-03-24 NOTE — Telephone Encounter (Signed)
Ok to schedule.  Thanks,  Harvel Quale, MD Gastroenterology and Hepatology [facility]

## 2020-03-24 NOTE — Telephone Encounter (Signed)
Suprep prep

## 2020-03-27 ENCOUNTER — Other Ambulatory Visit (HOSPITAL_COMMUNITY)
Admission: RE | Admit: 2020-03-27 | Discharge: 2020-03-27 | Disposition: A | Discharge status: Home / Self Care | Payer: Medicare Other | Source: Ambulatory Visit | Attending: Gastroenterology | Admitting: Gastroenterology

## 2020-03-27 ENCOUNTER — Other Ambulatory Visit: Payer: Self-pay

## 2020-03-27 DIAGNOSIS — Z01812 Encounter for preprocedural laboratory examination: Secondary | ICD-10-CM | POA: Insufficient documentation

## 2020-03-27 DIAGNOSIS — Z20822 Contact with and (suspected) exposure to covid-19: Secondary | ICD-10-CM | POA: Insufficient documentation

## 2020-03-27 LAB — SARS CORONAVIRUS 2 (TAT 6-24 HRS): SARS Coronavirus 2: NEGATIVE

## 2020-03-29 ENCOUNTER — Ambulatory Visit (HOSPITAL_COMMUNITY)
Admission: RE | Admit: 2020-03-29 | Discharge: 2020-03-29 | Disposition: A | Discharge status: Home / Self Care | Payer: Medicare Other | Attending: Gastroenterology | Admitting: Gastroenterology

## 2020-03-29 ENCOUNTER — Other Ambulatory Visit: Payer: Self-pay

## 2020-03-29 ENCOUNTER — Encounter (HOSPITAL_COMMUNITY)
Admission: RE | Disposition: A | Discharge status: Home / Self Care | Payer: Self-pay | Source: Home / Self Care | Attending: Gastroenterology

## 2020-03-29 ENCOUNTER — Encounter (HOSPITAL_COMMUNITY): Payer: Self-pay | Admitting: Gastroenterology

## 2020-03-29 ENCOUNTER — Ambulatory Visit (HOSPITAL_COMMUNITY): Payer: Medicare Other | Admitting: Anesthesiology

## 2020-03-29 DIAGNOSIS — Z79899 Other long term (current) drug therapy: Secondary | ICD-10-CM | POA: Insufficient documentation

## 2020-03-29 DIAGNOSIS — K5730 Diverticulosis of large intestine without perforation or abscess without bleeding: Secondary | ICD-10-CM | POA: Insufficient documentation

## 2020-03-29 DIAGNOSIS — Z7984 Long term (current) use of oral hypoglycemic drugs: Secondary | ICD-10-CM | POA: Insufficient documentation

## 2020-03-29 DIAGNOSIS — Z87891 Personal history of nicotine dependence: Secondary | ICD-10-CM | POA: Diagnosis not present

## 2020-03-29 DIAGNOSIS — Z1211 Encounter for screening for malignant neoplasm of colon: Secondary | ICD-10-CM | POA: Diagnosis not present

## 2020-03-29 DIAGNOSIS — E119 Type 2 diabetes mellitus without complications: Secondary | ICD-10-CM | POA: Diagnosis not present

## 2020-03-29 DIAGNOSIS — I10 Essential (primary) hypertension: Secondary | ICD-10-CM | POA: Diagnosis not present

## 2020-03-29 DIAGNOSIS — Z7982 Long term (current) use of aspirin: Secondary | ICD-10-CM | POA: Insufficient documentation

## 2020-03-29 HISTORY — PX: COLONOSCOPY WITH PROPOFOL: SHX5780

## 2020-03-29 LAB — GLUCOSE, CAPILLARY: Glucose-Capillary: 130 mg/dL — ABNORMAL HIGH (ref 70–99)

## 2020-03-29 LAB — HM COLONOSCOPY

## 2020-03-29 SURGERY — COLONOSCOPY WITH PROPOFOL
Anesthesia: General

## 2020-03-29 MED ORDER — CHLORHEXIDINE GLUCONATE CLOTH 2 % EX PADS: 6.0000 | MEDICATED_PAD | Freq: Once | CUTANEOUS | Status: DC

## 2020-03-29 MED ORDER — LIDOCAINE HCL (CARDIAC) PF 100 MG/5ML IV SOSY
PREFILLED_SYRINGE | INTRAVENOUS | Status: DC | PRN
  Administered 2020-03-29: 50 mg via INTRATRACHEAL

## 2020-03-29 MED ORDER — KETAMINE HCL 50 MG/5ML IJ SOSY
PREFILLED_SYRINGE | INTRAMUSCULAR | Status: AC
  Filled 2020-03-29: qty 5

## 2020-03-29 MED ORDER — KETAMINE HCL 10 MG/ML IJ SOLN
INTRAMUSCULAR | Status: DC | PRN
  Administered 2020-03-29: 20 mg via INTRAVENOUS

## 2020-03-29 MED ORDER — LACTATED RINGERS IV SOLN: INTRAVENOUS | Status: DC | PRN

## 2020-03-29 MED ORDER — LACTATED RINGERS IV SOLN: Freq: Once | INTRAVENOUS | Status: AC

## 2020-03-29 MED ORDER — PROPOFOL 500 MG/50ML IV EMUL
INTRAVENOUS | Status: DC | PRN
  Administered 2020-03-29: 125 ug/kg/min via INTRAVENOUS

## 2020-03-29 NOTE — Op Note (Signed)
[facility] Patient Name: Jacqueline [name] Procedure Date: 03/29/2020 8:45 AM MRN: [id number] Date of Birth: 10-15-1965 Attending MD: Maylon Peppers ,  CSN: [id number] Age: 54 Admit Type: Outpatient Procedure:                Colonoscopy Indications:              Screening for colorectal malignant neoplasm Providers:                Maylon Peppers, Randlett Sharon Seller, RN, Dereck Leep, Technician, Nelma Rothman, Technician Referring MD:              Medicines:                Monitored Anesthesia Care Complications:            No immediate complications. Estimated Blood Loss:     Estimated blood loss: none. Procedure:                Pre-Anesthesia Assessment:                           - Prior to the procedure, a History and Physical                            was performed, and patient medications, allergies                            and sensitivities were reviewed. The patient's                            tolerance of previous anesthesia was reviewed.                           - The risks and benefits of the procedure and the                            sedation options and risks were discussed with the                            patient. All questions were answered and informed                            consent was obtained.                           - ASA Grade Assessment: II - A patient with mild                            systemic disease.                           After obtaining informed consent, the colonoscope                            was passed under direct  vision. Throughout the                            procedure, the patient's blood pressure, pulse, and                            oxygen saturations were monitored continuously. The                            [id number]) was introduced through the anus                            and advanced to the the cecum, identified by                            appendiceal orifice and  ileocecal valve. The                            colonoscopy was performed without difficulty. The                            patient tolerated the procedure well. The quality                            of the bowel preparation was good. Scope withdrawal                            time was 11 minutes. Scope In: 9:00:17 AM Scope Out: 9:19:19 AM Scope Withdrawal Time: 0 hours 11 minutes 32 seconds  Total Procedure Duration: 0 hours 19 minutes 2 seconds  Findings:      The perianal and digital rectal examinations were normal.      A few large-mouthed diverticula were found in the sigmoid colon and       ascending colon.      The retroflexed view of the distal rectum and anal verge was normal and       showed no anal or rectal abnormalities. Impression:               - Diverticulosis in the sigmoid colon and in the                            ascending colon.                           - The distal rectum and anal verge are normal on                            retroflexion view.                           - No specimens collected. Moderate Sedation:      Per Anesthesia Care Recommendation:           - Discharge patient to home (ambulatory).                           -  High fiber diet.                           - Repeat colonoscopy in 10 years for screening                            purposes. Procedure Code(s):        --- Professional ---                           H0388, GC, Colorectal cancer screening; colonoscopy                            on individual not meeting criteria for high risk Diagnosis Code(s):        --- Professional ---                           Z12.11, Encounter for screening for malignant                            neoplasm of colon                           K57.30, Diverticulosis of large intestine without                            perforation or abscess without bleeding CPT copyright 2019 American Medical Association. All rights reserved. The codes documented in this  report are preliminary and upon coder review may  be revised to meet current compliance requirements. Maylon Peppers, MD Maylon Peppers,  03/29/2020 9:24:13 AM This report has been signed electronically. Number of Addenda: 0

## 2020-03-29 NOTE — Anesthesia Postprocedure Evaluation (Signed)
Anesthesia Post Note  Patient: Jacqueline [name]  Procedure(s) Performed: COLONOSCOPY WITH PROPOFOL (N/A )  Patient location during evaluation: PACU Anesthesia Type: General Level of consciousness: awake and awake and alert Pain management: pain level controlled Vital Signs Assessment: post-procedure vital signs reviewed and stable Respiratory status: spontaneous breathing, nonlabored ventilation and respiratory function stable Cardiovascular status: stable Postop Assessment: no apparent nausea or vomiting Anesthetic complications: no   No complications documented.   Last Vitals:  Vitals:   03/29/20 0759 03/29/20 0923  BP: (!) 160/100 104/70  Pulse: (!) 103   Resp: 15 (!) 23  Temp: 36.7 C 36.5 C  SpO2: 95% 95%    Last Pain:  Vitals:   03/29/20 0923  TempSrc: Oral  PainSc: 0-No pain                 Arijana Narayan Hristova

## 2020-03-29 NOTE — Discharge Instructions (Signed)
You are being discharged to home.  Eat a high fiber diet.  Your physician has recommended a repeat colonoscopy in 10 years for screening purposes.    Diverticulosis  Diverticulosis is a condition that develops when small pouches (diverticula) form in the wall of the large intestine (colon). The colon is where water is absorbed and stool (feces) is formed. The pouches form when the inside layer of the colon pushes through weak spots in the outer layers of the colon. You may have a few pouches or many of them. The pouches usually do not cause problems unless they become inflamed or infected. When this happens, the condition is called diverticulitis. What are the causes? The cause of this condition is not known. What increases the risk? The following factors may make you more likely to develop this condition:  Being older than age 52. Your risk for this condition increases with age. Diverticulosis is rare among people younger than age 52. By age 52, many people have it.  Eating a low-fiber diet.  Having frequent constipation.  Being overweight.  Not getting enough exercise.  Smoking.  Taking over-the-counter pain medicines, like aspirin and ibuprofen.  Having a family history of diverticulosis. What are the signs or symptoms? In most people, there are no symptoms of this condition. If you do have symptoms, they may include:  Bloating.  Cramps in the abdomen.  Constipation or diarrhea.  Pain in the lower left side of the abdomen. How is this diagnosed? Because diverticulosis usually has no symptoms, it is most often diagnosed during an exam for other colon problems. The condition may be diagnosed by:  Using a flexible scope to examine the colon (colonoscopy).  Taking an X-ray of the colon after dye has been put into the colon (barium enema).  Having a CT scan. How is this treated? You may not need treatment for this condition. Your health care provider may recommend  treatment to prevent problems. You may need treatment if you have symptoms or if you previously had diverticulitis. Treatment may include:  Eating a high-fiber diet.  Taking a fiber supplement.  Taking a live bacteria supplement (probiotic).  Taking medicine to relax your colon. Follow these instructions at home: Medicines  Take over-the-counter and prescription medicines only as told by your health care provider.  If told by your health care provider, take a fiber supplement or probiotic. Constipation prevention Your condition may cause constipation. To prevent or treat constipation, you may need to:  Drink enough fluid to keep your urine pale yellow.  Take over-the-counter or prescription medicines.  Eat foods that are high in fiber, such as beans, whole grains, and fresh fruits and vegetables.  Limit foods that are high in fat and processed sugars, such as fried or sweet foods.  General instructions  Try not to strain when you have a bowel movement.  Keep all follow-up visits as told by your health care provider. This is important. Contact a health care provider if you:  Have pain in your abdomen.  Have bloating.  Have cramps.  Have not had a bowel movement in 3 days. Get help right away if:  Your pain gets worse.  Your bloating becomes very bad.  You have a fever or chills, and your symptoms suddenly get worse.  You vomit.  You have bowel movements that are bloody or black.  You have bleeding from your rectum. Summary  Diverticulosis is a condition that develops when small pouches (diverticula) form in the wall  of the large intestine (colon).  You may have a few pouches or many of them.  This condition is most often diagnosed during an exam for other colon problems.  Treatment may include increasing the fiber in your diet, taking supplements, or taking medicines. This information is not intended to replace advice given to you by your health care  provider. Make sure you discuss any questions you have with your health care provider. Document Revised: 11/26/2018 Document Reviewed: 11/26/2018 Elsevier Patient Education  [place].  Colonoscopy, Adult, Care After This sheet gives you information about how to care for yourself after your procedure. Your doctor may also give you more specific instructions. If you have problems or questions, call your doctor. What can I expect after the procedure? After the procedure, it is common to have:  A small amount of blood in your poop (stool) for 24 hours.  Some gas.  Mild cramping or bloating in your belly (abdomen). Follow these instructions at home: Eating and drinking   Drink enough fluid to keep your pee (urine) pale yellow.  Follow instructions from your doctor about what you cannot eat or drink.  Return to your normal diet as told by your doctor. Avoid heavy or fried foods that are hard to digest. Activity  Rest as told by your doctor.  Do not sit for a long time without moving. Get up to take short walks every 1-2 hours. This is important. Ask for help if you feel weak or unsteady.  Return to your normal activities as told by your doctor. Ask your doctor what activities are safe for you. To help cramping and bloating:   Try walking around.  Put heat on your belly as told by your doctor. Use the heat source that your doctor recommends, such as a moist heat pack or a heating pad. ? Put a towel between your skin and the heat source. ? Leave the heat on for 20-30 minutes. ? Remove the heat if your skin turns bright red. This is very important if you are unable to feel pain, heat, or cold. You may have a greater risk of getting burned. General instructions  For the first 24 hours after the procedure: ? Do not drive or use machinery. ? Do not sign important documents. ? Do not drink alcohol. ? Do your daily activities more slowly than normal. ? Eat foods that are  soft and easy to digest.  Take over-the-counter or prescription medicines only as told by your doctor.  Keep all follow-up visits as told by your doctor. This is important. Contact a doctor if:  You have blood in your poop 2-3 days after the procedure. Get help right away if:  You have more than a small amount of blood in your poop.  You see large clumps of tissue (blood clots) in your poop.  Your belly is swollen.  You feel like you may vomit (nauseous).  You vomit.  You have a fever.  You have belly pain that gets worse, and medicine does not help your pain. Summary  After the procedure, it is common to have a small amount of blood in your poop. You may also have mild cramping and bloating in your belly.  For the first 24 hours after the procedure, do not drive or use machinery, do not sign important documents, and do not drink alcohol.  Get help right away if you have a lot of blood in your poop, feel like you may vomit, have  a fever, or have more belly pain. This information is not intended to replace advice given to you by your health care provider. Make sure you discuss any questions you have with your health care provider. Document Revised: 11/23/2018 Document Reviewed: 11/23/2018 Elsevier Patient Education  [place].

## 2020-03-29 NOTE — Anesthesia Preprocedure Evaluation (Addendum)
Anesthesia Evaluation  Patient identified by MRN, date of birth, ID band Patient awake    Reviewed: Allergy & Precautions, NPO status , Patient's Chart, lab work & pertinent test results  History of Anesthesia Complications Negative for: history of anesthetic complications  Airway Mallampati: II  TM Distance: >3 FB Neck ROM: Full    Dental  (+) Dental Advisory Given, Chipped, Missing   Pulmonary asthma , pneumonia, resolved, former smoker,    Pulmonary exam normal breath sounds clear to auscultation       Cardiovascular Exercise Tolerance: Good hypertension, Pt. on medications Normal cardiovascular exam+ dysrhythmias (tachycardia)  Rhythm:Regular Rate:Normal     Neuro/Psych  Headaches, PSYCHIATRIC DISORDERS Anxiety Pseudomotor cerebri   Neuromuscular disease    GI/Hepatic PUD, GERD  Medicated and Controlled,Fatty liver, high LFTs   Endo/Other  diabetes, Well Controlled, Type 2  Renal/GU Renal disease (stones )   cervical cancer     Musculoskeletal  (+) Arthritis ,   Abdominal   Peds  Hematology  (+) anemia ,   Anesthesia Other Findings   Reproductive/Obstetrics                           Anesthesia Physical Anesthesia Plan  ASA: III  Anesthesia Plan: General   Post-op Pain Management:    Induction: Intravenous  PONV Risk Score and Plan: TIVA  Airway Management Planned: Nasal Cannula and Natural Airway  Additional Equipment:   Intra-op Plan:   Post-operative Plan:   Informed Consent: I have reviewed the patients History and Physical, chart, labs and discussed the procedure including the risks, benefits and alternatives for the proposed anesthesia with the patient or authorized representative who has indicated his/her understanding and acceptance.     Dental advisory given  Plan Discussed with: CRNA and Surgeon  Anesthesia Plan Comments: (Risk of liver function getting  worse after anesthesia was explained)       Anesthesia Quick Evaluation

## 2020-03-29 NOTE — H&P (Signed)
Jacqueline [name] is an 54 y.o. female.   Chief Complaint: Screening colonoscopy HPI: 54 year old female with past medical history of asthma, arthritis, diabetes, fatty liver, GERD, hyperlipidemia, IBS, obesity, who comes to the hospital to undergo screening colonoscopy.  The patient Had her last colonoscopy 10 years ago, which was remarkable for diverticulosis in sigmoid.  She denies having any complaints such as melena, hematochezia, abdominal pain or distention, change in her bowel movement consistency or frequency, no changes in her weight recently.  No family history of colorectal cancer.   Past Medical History:  Diagnosis Date  . Abnormal transaminases   . Allergy    [facility]reesia 03/01/2020  . Anemia   . Anxiety disorder   . Arthritis   . Asthma   . Cancer ([place])    [facility]reesia 03/01/2020  . Cellulitis   . Chronic headaches   . Diabetes mellitus without complication ([place])    [facility]reesia 03/01/2020  . Diverticulitis   . DM (diabetes mellitus) ([place])   . Erosive esophagitis   . Fatty liver   . GERD (gastroesophageal reflux disease)   . Hyperlipidemia    [facility]reesia 03/01/2020  . IBS (irritable bowel syndrome)   . Kidney stones   . Neuromuscular disorder ([place])    [facility]reesia 03/01/2020  . Obesity   . Pneumonia   . Pseudotumor cerebri   . Sinus tachycardia     Past Surgical History:  Procedure Laterality Date  . ABDOMINAL HYSTERECTOMY N/A    [facility]ia 03/01/2020  . ANKLE SURGERY    . COLONOSCOPY  06/19/2009  . EGD  06/09/2009  . FOOT SURGERY    . NASAL SEPTUM SURGERY    . ovarian cysts     x3  . TONSILLECTOMY    . TUBAL LIGATION    . VAGINAL HYSTERECTOMY      Family History  Problem Relation Age of Onset  . Colitis Other   . Crohn's disease Other   . Breast cancer Other   . Ovarian cancer Other   . Celiac disease Other   . Clotting disorder Other   . Cystic fibrosis Other        pat. cousin  . Diabetes Mother   . Heart disease Other   . Irritable bowel syndrome Other   .  Kidney failure Other 31       mat. nephew  . Diabetes Maternal Aunt   . Diabetes Son   . Diabetes Other        mat. nephew  . Colon cancer Neg Hx    Social History:  reports that she has quit smoking. She has never used smokeless tobacco. She reports that she does not drink alcohol and does not use drugs.  Allergies:  Allergies  Allergen Reactions  . Bee Venom Anaphylaxis  . Aspirin Hives, Itching and Swelling  . Codeine     Increases liver enzymes   . Eql Antibacterial Hand Soap [Benzalkonium Chloride] Hives  . Nsaids     Hallucinations   . Reglan [Metoclopramide] Hives    Medications Prior to Admission  Medication Sig Dispense Refill  . Ascorbic Acid (VITAMIN C) 1000 MG tablet Take 1,000 mg by mouth daily.    Marland Kitchen aspirin EC 81 MG tablet Take 81 mg by mouth daily.    . Biotin (BIOTIN 5000) 5 MG CAPS Take 5 mg by mouth daily.    . cetirizine (ZYRTEC) 10 MG tablet Take 10 mg by mouth daily.    . Cholecalciferol (VITAMIN D3) 125 MCG (  5000 UT) CAPS Take 5,000 Units by mouth daily.    . Cyanocobalamin (B-12) 2500 MCG TABS Take 2,500 mcg by mouth daily.    . Dulaglutide (TRULICITY) 1.5 GG/8.3MO SOPN INJECT 1 PEN INTO THE SKIN ONCE A WEEK (Patient taking differently: Inject 1.5 mg as directed every Tuesday. ) 2 mL 3  . DULoxetine (CYMBALTA) 60 MG capsule TAKE TWO CAPSULES BY MOUTH ONCE DAILY (Patient taking differently: Take 120 mg by mouth daily. ) 60 capsule 3  . EPINEPHrine (EPIPEN 2-PAK) 0.3 mg/0.3 mL IJ SOAJ injection Inject 0.3 mg into the muscle as needed for anaphylaxis.    . Flaxseed, Linseed, (FLAXSEED OIL PO) Take 1 capsule by mouth daily.    . fluticasone (FLONASE) 50 MCG/ACT nasal spray SHAKE LIQUID AND USE 2 SPRAYS IN EACH NOSTRIL DAILY (Patient taking differently: Place 2 sprays into both nostrils daily. ) 16 g 6  . furosemide (LASIX) 20 MG tablet TAKE 1 TABLET(20 MG) BY MOUTH TWICE DAILY (Patient taking differently: Take 40 mg by mouth daily. ) 180 tablet 3  . gabapentin  (NEURONTIN) 600 MG tablet TAKE 1/2 TO 2 TABLETS(300 TO 1200 MG) BY MOUTH THREE TIMES DAILY AS NEEDED (Patient taking differently: Take 1,800 mg by mouth at bedtime. ) 180 tablet 3  . metFORMIN (GLUCOPHAGE) 1000 MG tablet Take 1 tablet (1,000 mg total) by mouth 2 (two) times daily with a meal. 180 tablet 2  . metoprolol succinate (TOPROL-XL) 50 MG 24 hr tablet Take with or immediately following a meal. (Patient taking differently: Take by mouth daily. ) 90 tablet 1  . oxcarbazepine (TRILEPTAL) 600 MG tablet TAKE 1 TABLET(600 MG) BY MOUTH DAILY (Patient taking differently: Take 600 mg by mouth daily. ) 90 tablet 3  . pantoprazole (PROTONIX) 40 MG tablet TAKE 1 TABLET(40 MG) BY MOUTH DAILY (Patient taking differently: Take 40 mg by mouth daily. ) 90 tablet 3  . Polyethyl Glycol-Propyl Glycol (SYSTANE OP) Place 1 drop into both eyes daily as needed (dry eyes).    . rosuvastatin (CRESTOR) 20 MG tablet Take 1 tablet (20 mg total) by mouth daily. 90 tablet 3  . triamcinolone cream (KENALOG) 0.1 % Apply 1 application topically 2 (two) times daily. (Patient taking differently: Apply 1 application topically 2 (two) times daily as needed (eczema). ) 30 g 0  . Vitamin A 2400 MCG (8000 UT) CAPS Take 2,400 mcg by mouth daily.    . vitamin E 180 MG (400 UNITS) capsule Take 400 Units by mouth daily.    Marland Kitchen zinc gluconate 50 MG tablet Take 50 mg by mouth daily.    . Blood Glucose Monitoring Suppl (BLOOD GLUCOSE SYSTEM PAK) KIT Please dispense based on patient and insurance preference. Use as directed to monitor FSBS 2x daily. Dx: E11.9. 1 kit 1  . doxycycline (VIBRA-TABS) 100 MG tablet Take 1 tablet (100 mg total) by mouth 2 (two) times daily. (Patient not taking: Reported on 03/24/2020) 14 tablet 0  . glucose blood (ACCU-CHEK GUIDE) test strip USE TO CHECK FASTING BLOOD SUGAR TWICE DAILY 100 strip 1  . Lancets (ONETOUCH DELICA PLUS [id number]) MISC USE TO TEST BLOOD SUGAR TWICE DAILY 100 each 3  . Na Sulfate-K  Sulfate-Mg Sulf (SUPREP BOWEL PREP KIT) 17.5-3.13-1.6 GM/177ML SOLN Take 1 kit by mouth as directed. 177 mL 0  . neomycin-polymyxin-hydrocortisone (CORTISPORIN) OTIC solution Place 3 drops into the right ear 4 (four) times daily. (Patient not taking: Reported on 03/24/2020) 10 mL 0  . valACYclovir (VALTREX) 500 MG tablet  Take 1 tablet (500 mg total) by mouth 2 (two) times daily. (Patient taking differently: Take 500 mg by mouth 2 (two) times daily as needed (fever blisters). ) 6 tablet 2    Results for orders placed or performed during the hospital encounter of 03/27/20 (from the past 48 hour(s))  SARS CORONAVIRUS 2 (TAT 6-24 HRS) Nasopharyngeal Nasopharyngeal Swab     Status: None   Collection Time: 03/27/20  8:45 AM   Specimen: Nasopharyngeal Swab  Result Value Ref Range   SARS Coronavirus 2 NEGATIVE NEGATIVE    Comment: (NOTE) SARS-CoV-2 target nucleic acids are NOT DETECTED.  The SARS-CoV-2 RNA is generally detectable in upper and lower respiratory specimens during the acute phase of infection. Negative results do not preclude SARS-CoV-2 infection, do not rule out co-infections with other pathogens, and should not be used as the sole basis for treatment or other patient management decisions. Negative results must be combined with clinical observations, patient history, and epidemiological information. The expected result is Negative.  Fact Sheet for Patients: SugarRoll.be  Fact Sheet for Healthcare Providers: https://www.woods-mathews.com/  This test is not yet approved or cleared by the [place] FDA and  has been authorized for detection and/or diagnosis of SARS-CoV-2 by FDA under an Emergency Use Authorization (EUA). This EUA will remain  in effect (meaning this test can be used) for the duration of the COVID-19 declaration under Se ction 564(b)(1) of the Act, 21 U.S.C. section 360bbb-3(b)(1), unless the authorization is terminated  or revoked sooner.  Performed at [facility], [place] [address], [place], [place] [address]    No results found.  Review of Systems  Constitutional: Negative.   HENT: Negative.   Eyes: Negative.   Respiratory: Negative.   Cardiovascular: Negative.   Gastrointestinal: Negative.   Endocrine: Negative.   Genitourinary: Negative.   Musculoskeletal: Negative.   Skin: Negative.   Allergic/Immunologic: Negative.   Neurological: Negative.   Hematological: Negative.   Psychiatric/Behavioral: Negative.     Blood pressure (!) 160/100, pulse (!) 103, temperature 98.1 F (36.7 C), temperature source Oral, resp. rate 15, height 5' 2.5" (1.588 m), weight 85.5 kg, SpO2 95 %. Physical Exam  GENERAL: The patient is AO x3, in no acute distress. HEENT: Head is normocephalic and atraumatic. EOMI are intact. Mouth is well hydrated and without lesions. NECK: Supple. No masses LUNGS: Clear to auscultation. No presence of rhonchi/wheezing/rales. Adequate chest expansion HEART: RRR, normal s1 and s2. ABDOMEN: Soft, nontender, no guarding, no peritoneal signs, and nondistended. BS +. No masses. EXTREMITIES: Without any cyanosis, clubbing, rash, lesions or edema. NEUROLOGIC: AOx3, no focal motor deficit. SKIN: no jaundice, no rashes  Assessment/Plan 54 year old female with past medical history of asthma, arthritis, diabetes, fatty liver, GERD, hyperlipidemia, IBS, obesity, who comes to the hospital to undergo screening colonoscopy.  The patient is at average risk for colorectal cancer.  We will proceed with colonoscopy today.   Harvel Quale, MD 03/29/2020, 8:05 AM

## 2020-03-29 NOTE — Transfer of Care (Signed)
Immediate Anesthesia Transfer of Care Note  Patient: Jacqueline [name]  Procedure(s) Performed: COLONOSCOPY WITH PROPOFOL (N/A )  Patient Location: PACU  Anesthesia Type:General  Level of Consciousness: awake  Airway & Oxygen Therapy: Patient Spontanous Breathing  Post-op Assessment: Report given to RN and Post -op Vital signs reviewed and stable  Post vital signs: Reviewed and stable  Last Vitals:  Vitals Value Taken Time  BP 104/70 03/29/20 0923  Temp 36.5 C 03/29/20 0923  Pulse    Resp 23 03/29/20 0923  SpO2 95 % 03/29/20 0923    Last Pain:  Vitals:   03/29/20 0923  TempSrc: Oral  PainSc: 0-No pain      Patients Stated Pain Goal: 10 (41/96/22 2979)  Complications: No complications documented.

## 2020-03-31 ENCOUNTER — Other Ambulatory Visit: Payer: Self-pay | Admitting: Family Medicine

## 2020-04-03 ENCOUNTER — Encounter (HOSPITAL_COMMUNITY): Payer: Self-pay | Admitting: Gastroenterology

## 2020-04-05 ENCOUNTER — Encounter (INDEPENDENT_AMBULATORY_CARE_PROVIDER_SITE_OTHER): Payer: Self-pay | Admitting: *Deleted

## 2020-04-07 ENCOUNTER — Other Ambulatory Visit: Payer: Self-pay | Admitting: Family Medicine

## 2020-04-29 ENCOUNTER — Other Ambulatory Visit: Payer: Self-pay | Admitting: Family Medicine

## 2020-05-01 ENCOUNTER — Encounter: Payer: Self-pay | Admitting: Family Medicine

## 2020-05-02 ENCOUNTER — Telehealth (INDEPENDENT_AMBULATORY_CARE_PROVIDER_SITE_OTHER): Payer: Medicare Other | Admitting: Nurse Practitioner

## 2020-05-02 DIAGNOSIS — J069 Acute upper respiratory infection, unspecified: Secondary | ICD-10-CM | POA: Insufficient documentation

## 2020-05-02 MED ORDER — GUAIFENESIN ER 600 MG PO TB12: 600.0000 mg | ORAL_TABLET | Freq: Two times a day (BID) | ORAL | 0 refills | Status: DC | PRN

## 2020-05-02 NOTE — Assessment & Plan Note (Signed)
Acute, ongoing x 1 day.  Declines COVID testing, has been vaccinated and has received booster for COVID-19.  Reassured patient that symptoms and exam findings are most consistent with a viral upper respiratory infection and explained lack of efficacy of antibiotics against viruses.  Discussed expected course and features suggestive of secondary bacterial infection.  Continue supportive care. Increase fluid intake with water or electrolyte solution like pedialyte. Encouraged acetaminophen as needed for fever/pain. Encouraged salt water gargling, chloraseptic spray and throat lozenges. Encouraged OTC guaifenesin. Encouraged saline sinus flushes and/or neti with humidified air.

## 2020-05-02 NOTE — Progress Notes (Signed)
Subjective:    Patient ID: Jacqueline [name], female    DOB: [birth year], 54 y.o.   MRN: [id number]  HPI: Jacqueline [name] is a 54 y.o. female presenting for sore throat and congestion.  Chief Complaint  Patient presents with   Illness    Sinus issues for years, lots of productive coughing, feels like throat is raw. Does not feel she's been exposed to corona virus. Taking robitussin and vitamins or the sx.   UPPER RESPIRATORY TRACT INFECTION Onset: yesterday 05/01/2020 Worst symptom: throat on fire Fever: 99.0 highest Cough: yes; productive - thick Shortness of breath: no Wheezing: no; only when about to cough Chest pain: no Chest tightness: no Chest congestion: no Nasal congestion: yes Runny nose: yes Post nasal drip: yes Sneezing: no Sore throat: yes; back of throat to right below vocal cords Swollen glands: no Sinus pressure: yes Headache: no Face pain: no Toothache: no Ear pain: no  Ear pressure: no  Eyes red/itching:yes Eye drainage/crusting: no  Nausea: no Vomiting: no Diarrhea: no Change in appetite: no change Loss of taste/smell: yes Rash: no Fatigue: no Sick contacts: yes; husband has been sick for about 1 week Strep contacts: no  Context: stable Recurrent sinusitis: no Treatments attempted: Robitussin, vitamins, salt water gargle Relief with OTC medications: no  Allergies  Allergen Reactions   Bee Venom Anaphylaxis   Aspirin Hives, Itching and Swelling   Codeine     Increases liver enzymes    Eql Antibacterial Hand Soap [Benzalkonium Chloride] Hives   Nsaids     Hallucinations    Reglan [Metoclopramide] Hives    Outpatient Encounter Medications as of 05/02/2020  Medication Sig   Ascorbic Acid (VITAMIN C) 1000 MG tablet Take 1,000 mg by mouth daily.   aspirin EC 81 MG tablet Take 81 mg by mouth daily.   Biotin 5 MG CAPS Take 5 mg by mouth daily.   Blood Glucose Monitoring Suppl (BLOOD GLUCOSE SYSTEM PAK) KIT Please dispense based on  patient and insurance preference. Use as directed to monitor FSBS 2x daily. Dx: E11.9.   cetirizine (ZYRTEC) 10 MG tablet Take 10 mg by mouth daily.   Cholecalciferol (VITAMIN D3) 125 MCG (5000 UT) CAPS Take 5,000 Units by mouth daily.   Cyanocobalamin (B-12) 2500 MCG TABS Take 2,500 mcg by mouth daily.   DULoxetine (CYMBALTA) 60 MG capsule TAKE TWO CAPSULES BY MOUTH ONCE DAILY   EPINEPHrine 0.3 mg/0.3 mL IJ SOAJ injection Inject 0.3 mg into the muscle as needed for anaphylaxis.   Flaxseed, Linseed, (FLAXSEED OIL PO) Take 1 capsule by mouth daily.   fluticasone (FLONASE) 50 MCG/ACT nasal spray SHAKE LIQUID AND USE 2 SPRAYS IN EACH NOSTRIL DAILY (Patient taking differently: Place 2 sprays into both nostrils daily.)   furosemide (LASIX) 20 MG tablet TAKE 1 TABLET(20 MG) BY MOUTH TWICE DAILY   gabapentin (NEURONTIN) 600 MG tablet TAKE 1/2 TO 2 TABLETS(300 TO 1200 MG) BY MOUTH THREE TIMES DAILY AS NEEDED (Patient taking differently: Take 1,800 mg by mouth at bedtime.)   glucose blood (ACCU-CHEK GUIDE) test strip USE TO CHECK FASTING BLOOD SUGAR TWICE DAILY   Lancets (ONETOUCH DELICA PLUS [id number]) MISC USE TO TEST BLOOD SUGAR TWICE DAILY   metFORMIN (GLUCOPHAGE) 1000 MG tablet Take 1 tablet (1,000 mg total) by mouth 2 (two) times daily with a meal.   metoprolol succinate (TOPROL-XL) 50 MG 24 hr tablet Take with or immediately following a meal. (Patient taking differently: Take by mouth daily.)   neomycin-polymyxin-hydrocortisone (  CORTISPORIN) OTIC solution Place 3 drops into the right ear 4 (four) times daily.   oxcarbazepine (TRILEPTAL) 600 MG tablet TAKE 1 TABLET(600 MG) BY MOUTH DAILY (Patient taking differently: Take 600 mg by mouth daily.)   pantoprazole (PROTONIX) 40 MG tablet TAKE 1 TABLET(40 MG) BY MOUTH DAILY (Patient taking differently: Take 40 mg by mouth daily.)   Polyethyl Glycol-Propyl Glycol (SYSTANE OP) Place 1 drop into both eyes daily as needed (dry eyes).    rosuvastatin (CRESTOR) 20 MG tablet Take 1 tablet (20 mg total) by mouth daily.   triamcinolone cream (KENALOG) 0.1 % Apply 1 application topically 2 (two) times daily. (Patient taking differently: Apply 1 application topically 2 (two) times daily as needed (eczema).)   TRULICITY 1.5 WS/5.6CL SOPN INJECT 1 PEN INTO THE SKIN ONCE A WEEK   valACYclovir (VALTREX) 500 MG tablet Take 1 tablet (500 mg total) by mouth 2 (two) times daily. (Patient taking differently: Take 500 mg by mouth 2 (two) times daily as needed (fever blisters).)   Vitamin A 2400 MCG (8000 UT) CAPS Take 2,400 mcg by mouth daily.   vitamin E 180 MG (400 UNITS) capsule Take 400 Units by mouth daily.   zinc gluconate 50 MG tablet Take 50 mg by mouth daily.   [DISCONTINUED] doxycycline (VIBRA-TABS) 100 MG tablet Take 1 tablet (100 mg total) by mouth 2 (two) times daily.   guaiFENesin (MUCINEX) 600 MG 12 hr tablet Take 1 tablet (600 mg total) by mouth 2 (two) times daily as needed for cough or to loosen phlegm.   No facility-administered encounter medications on file as of 05/02/2020.    Patient Active Problem List   Diagnosis Date Noted   Upper respiratory tract infection 05/02/2020   Chest pain 01/31/2014   Dyslipidemia associated with type 2 diabetes mellitus ([place]) 01/31/2014   Diabetic neuropathy associated with diabetes mellitus due to underlying condition ([place]) 01/31/2014   Pseudotumor cerebri 01/31/2014   Essential hypertension, benign 08/05/2012   Other and unspecified hyperlipidemia 08/05/2012   Obesity (BMI 30.0-34.9) 11/18/2011   Diabetes mellitus, type II ([place]) 11/18/2011   Chronic migraine 11/18/2011   GERD 05/29/2009   IRRITABLE BOWEL SYNDROME 05/29/2009   FATTY LIVER DISEASE 05/29/2009    Past Medical History:  Diagnosis Date   Abnormal transaminases    Allergy    Phreesia 03/01/2020   Anemia    Anxiety disorder    Arthritis    Asthma    Cancer ([place])    [employer]reesia 03/01/2020    Cellulitis    Chronic headaches    Diabetes mellitus without complication ([place])    [employer]reesia 03/01/2020   Diverticulitis    DM (diabetes mellitus) ([facility])    Erosive esophagitis    Fatty liver    GERD (gastroesophageal reflux disease)    Hyperlipidemia    [employer]reesia 03/01/2020   IBS (irritable bowel syndrome)    Kidney stones    Neuromuscular disorder ([place])    Phreesia 03/01/2020   Obesity    Pneumonia    Pseudotumor cerebri    Sinus tachycardia     Relevant past medical, surgical, family and social history reviewed and updated as indicated. Interim medical history since our last visit reviewed.  Review of Systems  Constitutional: Positive for fever (low grade). Negative for appetite change, chills, diaphoresis and fatigue.  HENT: Positive for congestion, postnasal drip, rhinorrhea, sinus pressure and sore throat. Negative for dental problem, ear discharge, ear pain, hearing loss, sinus pain, sneezing and trouble swallowing.   Eyes:  Positive for redness and itching. Negative for pain and discharge.  Respiratory: Positive for cough. Negative for chest tightness, shortness of breath and wheezing.   Cardiovascular: Negative.  Negative for chest pain.  Gastrointestinal: Negative.  Negative for constipation, diarrhea, nausea and vomiting.  Skin: Negative.  Negative for rash.  Neurological: Negative.  Negative for headaches.  Hematological: Negative.  Negative for adenopathy.  Psychiatric/Behavioral: Negative.     Per HPI unless specifically indicated above     Objective:    There were no vitals taken for this visit.  Wt Readings from Last 3 Encounters:  03/29/20 188 lb 9.6 oz (85.5 kg)  03/02/20 190 lb (86.2 kg)  11/16/19 191 lb (86.6 kg)    Physical Exam Vitals and nursing note reviewed.  Constitutional:      General: She is not in acute distress.    Appearance: Normal appearance. She is not ill-appearing or toxic-appearing.  HENT:     Head:  Normocephalic and atraumatic.     Nose: Nose normal. No congestion or rhinorrhea.     Mouth/Throat:     Mouth: Mucous membranes are moist.     Pharynx: Oropharynx is clear.  Eyes:     General: No scleral icterus.       Right eye: No discharge.        Left eye: No discharge.     Extraocular Movements: Extraocular movements intact.  Cardiovascular:     Comments: Unable to assess heart sounds via virtual visit. Pulmonary:     Effort: Pulmonary effort is normal. No respiratory distress.     Comments: Unable to assess lung sounds via virtual visit; patient talking in complete sentences without accessory muscle use. Abdominal:     Comments: Unable to assess bowel sounds via virtual visit  Skin:    Coloration: Skin is not jaundiced or pale.     Findings: No erythema.  Neurological:     Mental Status: She is alert and oriented to person, place, and time.  Psychiatric:        Mood and Affect: Mood normal.        Behavior: Behavior normal.        Thought Content: Thought content normal.        Judgment: Judgment normal.        Assessment & Plan:   Problem List Items Addressed This Visit      Respiratory   Upper respiratory tract infection - Primary    Acute, ongoing x 1 day.  Declines COVID testing, has been vaccinated and has received booster for COVID-19.  Reassured patient that symptoms and exam findings are most consistent with a viral upper respiratory infection and explained lack of efficacy of antibiotics against viruses.  Discussed expected course and features suggestive of secondary bacterial infection.  Continue supportive care. Increase fluid intake with water or electrolyte solution like pedialyte. Encouraged acetaminophen as needed for fever/pain. Encouraged salt water gargling, chloraseptic spray and throat lozenges. Encouraged OTC guaifenesin. Encouraged saline sinus flushes and/or neti with humidified air.            Follow up plan: Return if symptoms worsen or fail  to improve.  Due to the catastrophic nature of the COVID-19 pandemic, this visit was completed via audio and visual contact via [employer] due to the restrictions of the COVID-19 pandemic. All issues as above were discussed and addressed. Physical exam was done as above through visual confirmation on Careg[employer]. If it was felt that the patient should be evaluated  in the office, they were directed there. The patient verbally consented to this visit."}  Location of the patient: home  Location of the provider: work  Those involved with this call:   Provider: Carnella Gu[place], DNP  CMA: Annabelle Harman, [place] Desk/Registration: Jeralene Peters   Time spent on call: 15 minutes with patient face to face via video conference. More than 50% of this time was spent in counseling and coordination of care. 30 minutes total spent in review of patient's record and preparation of their chart.  I verified patient identity using two factors (patient name and date of birth). Patient consents verbally to being seen via telemedicine visit today.

## 2020-05-03 ENCOUNTER — Ambulatory Visit: Payer: Self-pay | Admitting: Pharmacist

## 2020-05-03 NOTE — Chronic Care Management (AMB) (Signed)
Chronic Care Management   Follow Up Note   05/03/2020 Name: Jacqueline [name] MRN: [id number] DOB: 12-Mar-1966  Referred by: Susy Frizzle, MD Reason for referral : No chief complaint on file.   Jacqueline [name] is a 54 y.o. year old female who is a primary care patient of Pickard, Cammie Mcgee, MD. The CCM team was consulted for assistance with chronic disease management and care coordination needs.    Review of patient status, including review of consultants reports, relevant laboratory and other test results, and collaboration with appropriate care team members and the patient's provider was performed as part of comprehensive patient evaluation and provision of chronic care management services.    SDOH (Social Determinants of Health) assessments performed: No See Care Plan activities for detailed interventions related to [facility])     Outpatient Encounter Medications as of 05/03/2020  Medication Sig  . Ascorbic Acid (VITAMIN C) 1000 MG tablet Take 1,000 mg by mouth daily.  Marland Kitchen aspirin EC 81 MG tablet Take 81 mg by mouth daily.  . Biotin 5 MG CAPS Take 5 mg by mouth daily.  . Blood Glucose Monitoring Suppl (BLOOD GLUCOSE SYSTEM PAK) KIT Please dispense based on patient and insurance preference. Use as directed to monitor FSBS 2x daily. Dx: E11.9.  Marland Kitchen cetirizine (ZYRTEC) 10 MG tablet Take 10 mg by mouth daily.  . Cholecalciferol (VITAMIN D3) 125 MCG (5000 UT) CAPS Take 5,000 Units by mouth daily.  . Cyanocobalamin (B-12) 2500 MCG TABS Take 2,500 mcg by mouth daily.  . DULoxetine (CYMBALTA) 60 MG capsule TAKE TWO CAPSULES BY MOUTH ONCE DAILY  . EPINEPHrine 0.3 mg/0.3 mL IJ SOAJ injection Inject 0.3 mg into the muscle as needed for anaphylaxis.  . Flaxseed, Linseed, (FLAXSEED OIL PO) Take 1 capsule by mouth daily.  . fluticasone (FLONASE) 50 MCG/ACT nasal spray SHAKE LIQUID AND USE 2 SPRAYS IN EACH NOSTRIL DAILY (Patient taking differently: Place 2 sprays into both nostrils daily.)  . furosemide  (LASIX) 20 MG tablet TAKE 1 TABLET(20 MG) BY MOUTH TWICE DAILY  . gabapentin (NEURONTIN) 600 MG tablet TAKE 1/2 TO 2 TABLETS(300 TO 1200 MG) BY MOUTH THREE TIMES DAILY AS NEEDED (Patient taking differently: Take 1,800 mg by mouth at bedtime.)  . glucose blood (ACCU-CHEK GUIDE) test strip USE TO CHECK FASTING BLOOD SUGAR TWICE DAILY  . guaiFENesin (MUCINEX) 600 MG 12 hr tablet Take 1 tablet (600 mg total) by mouth 2 (two) times daily as needed for cough or to loosen phlegm.  . Lancets (ONETOUCH DELICA PLUS [id number]) MISC USE TO TEST BLOOD SUGAR TWICE DAILY  . metFORMIN (GLUCOPHAGE) 1000 MG tablet Take 1 tablet (1,000 mg total) by mouth 2 (two) times daily with a meal.  . metoprolol succinate (TOPROL-XL) 50 MG 24 hr tablet Take with or immediately following a meal. (Patient taking differently: Take by mouth daily.)  . neomycin-polymyxin-hydrocortisone (CORTISPORIN) OTIC solution Place 3 drops into the right ear 4 (four) times daily.  Marland Kitchen oxcarbazepine (TRILEPTAL) 600 MG tablet TAKE 1 TABLET(600 MG) BY MOUTH DAILY (Patient taking differently: Take 600 mg by mouth daily.)  . pantoprazole (PROTONIX) 40 MG tablet TAKE 1 TABLET(40 MG) BY MOUTH DAILY (Patient taking differently: Take 40 mg by mouth daily.)  . Polyethyl Glycol-Propyl Glycol (SYSTANE OP) Place 1 drop into both eyes daily as needed (dry eyes).  . rosuvastatin (CRESTOR) 20 MG tablet Take 1 tablet (20 mg total) by mouth daily.  Marland Kitchen triamcinolone cream (KENALOG) 0.1 % Apply 1 application topically 2 (two)  times daily. (Patient taking differently: Apply 1 application topically 2 (two) times daily as needed (eczema).)  . TRULICITY 1.5 ER/8.4XQ SOPN INJECT 1 PEN INTO THE SKIN ONCE A WEEK  . valACYclovir (VALTREX) 500 MG tablet Take 1 tablet (500 mg total) by mouth 2 (two) times daily. (Patient taking differently: Take 500 mg by mouth 2 (two) times daily as needed (fever blisters).)  . Vitamin A 2400 MCG (8000 UT) CAPS Take 2,400 mcg by mouth daily.  .  vitamin E 180 MG (400 UNITS) capsule Take 400 Units by mouth daily.  Marland Kitchen zinc gluconate 50 MG tablet Take 50 mg by mouth daily.   No facility-administered encounter medications on file as of 05/03/2020.    Reviewed chart for medication changes ahead of medication coordination call.  05/02/2020 Edsel Petrin, NP) - video visit for congestion and sore throat.  Her husband has been at home dealing with the same issues and now she is starting to have symptoms.  Only a few days into symptoms so likely viral and she was tole to take Mucinex for congestion and treat sore throat with chloraseptic spray and lozenges.  No medication changes indicated.  BP Readings from Last 3 Encounters:  03/29/20 104/70  03/02/20 126/80  11/16/19 120/90    Lab Results  Component Value Date   HGBA1C 6.5 (H) 02/28/2020     Patient obtains medications through Vials  90 Days   Last adherence delivery included: On 11/26 patient received  Furosemide 39m  Gabapentin 6033m  Fluticasone 5019m  Metoprolol XL 51m82KSne touch Delica 33g13Gncets  Oxcarbazepine 600m44mosuvastatin 20mg87JLulicity 1.5 - 90 day supply received on 03-31-20    Patient is due for next adherence delivery on:  05/13/2020. Called patient and reviewed medications and coordinated delivery.  This delivery to include: Accu-Check Guide test Strips Duloxetine 60mg3mticasone 51mcg70mal spray Furosemide 20mg  56mpentin 600mg Me17mmin 1000mg Met22mlol XL 51mg Onet59DI Delica 33g lance71EOxcarbazepine 600mg  Pan6mazole 40mg daily13muvastatin 20mg   Pati81mdeclined the following medications:  Trulicity 1.5mg/0.5ml 5.5MZ/5.8EWiving a 90 day supply on 03/31/2020  No refills needed at this time patient has enough supply on hand  Confirmed delivery date of 05/11/20 due to the upcoming holiday schedule, advised patient that pharmacy will contact them the morning of delivery.  We are delivering this before then end of the  calendar year as patient is still on the Extra Help through medicare to make sure she gets another delivery of 90 days supply before the year ends while copays are still under that program.  Amritha Yorke DaBeverly Milchnical Pharmacist [place] [phone]

## 2020-05-04 ENCOUNTER — Encounter: Payer: Self-pay | Admitting: Family Medicine

## 2020-05-04 ENCOUNTER — Other Ambulatory Visit: Payer: Self-pay

## 2020-05-04 ENCOUNTER — Ambulatory Visit (INDEPENDENT_AMBULATORY_CARE_PROVIDER_SITE_OTHER): Payer: Medicare Other | Admitting: Family Medicine

## 2020-05-04 VITALS — BP 108/72 | HR 96 | Temp 96.4°F | Ht 62.5 in | Wt 187.0 lb

## 2020-05-04 DIAGNOSIS — J069 Acute upper respiratory infection, unspecified: Secondary | ICD-10-CM

## 2020-05-04 DIAGNOSIS — R059 Cough, unspecified: Secondary | ICD-10-CM

## 2020-05-04 MED ORDER — AMOXICILLIN 875 MG PO TABS: 875.0000 mg | ORAL_TABLET | Freq: Two times a day (BID) | ORAL | 0 refills | Status: DC

## 2020-05-04 NOTE — Progress Notes (Signed)
 Subjective:    Patient ID: Jacqueline [name], female    DOB: 08/15/1965, 53 y.o.   MRN: [id number]  HPI Symptoms began Monday.  Symptoms include head congestion, rhinorrhea, frontal and maxillary sinus pain, sore throat, and an occasional cough.  She has had all 3 doses of her Covid vaccine.  Her husband has similar symptoms.  I suspect a viral upper respiratory infection/viral sinusitis Past Medical History:  Diagnosis Date  . Abnormal transaminases   . Allergy    Phreesia 03/01/2020  . Anemia   . Anxiety disorder   . Arthritis   . Asthma   . Cancer (HCC)    Phreesia 03/01/2020  . Cellulitis   . Chronic headaches   . Diabetes mellitus without complication (HCC)    Phreesia 03/01/2020  . Diverticulitis   . DM (diabetes mellitus) (HCC)   . Erosive esophagitis   . Fatty liver   . GERD (gastroesophageal reflux disease)   . Hyperlipidemia    [facility]reesia 03/01/2020  . IBS (irritable bowel syndrome)   . Kidney stones   . Neuromuscular disorder (HCC)    [facility]reesia 03/01/2020  . Obesity   . Pneumonia   . Pseudotumor cerebri   . Sinus tachycardia    Past Surgical History:  Procedure Laterality Date  . ABDOMINAL HYSTERECTOMY N/A    [facility]reesia 03/01/2020  . ANKLE SURGERY    . COLONOSCOPY  06/19/2009  . COLONOSCOPY WITH PROPOFOL N/A 03/29/2020   Procedure: COLONOSCOPY WITH PROPOFOL;  Surgeon: Castaneda Mayorga, Daniel, MD;  Location: [facility];  Service: Gastroenterology;  Laterality: N/A;  9:00  . EGD  06/09/2009  . FOOT SURGERY    . NASAL SEPTUM SURGERY    . ovarian cysts     x3  . TONSILLECTOMY    . TUBAL LIGATION    . VAGINAL HYSTERECTOMY     Current Outpatient Medications on File Prior to Visit  Medication Sig Dispense Refill  . Ascorbic Acid (VITAMIN C) 1000 MG tablet Take 1,000 mg by mouth daily.    . aspirin EC 81 MG tablet Take 81 mg by mouth daily.    . Biotin 5 MG CAPS Take 5 mg by mouth daily.    . Blood Glucose Monitoring Suppl (BLOOD GLUCOSE SYSTEM PAK) KIT  Please dispense based on patient and insurance preference. Use as directed to monitor FSBS 2x daily. Dx: E11.9. 1 kit 1  . cetirizine (ZYRTEC) 10 MG tablet Take 10 mg by mouth daily.    . Cholecalciferol (VITAMIN D3) 125 MCG (5000 UT) CAPS Take 5,000 Units by mouth daily.    . Cyanocobalamin (B-12) 2500 MCG TABS Take 2,500 mcg by mouth daily.    . DULoxetine (CYMBALTA) 60 MG capsule TAKE TWO CAPSULES BY MOUTH ONCE DAILY 60 capsule 3  . EPINEPHrine 0.3 mg/0.3 mL IJ SOAJ injection Inject 0.3 mg into the muscle as needed for anaphylaxis.    . Flaxseed, Linseed, (FLAXSEED OIL PO) Take 1 capsule by mouth daily.    . fluticasone (FLONASE) 50 MCG/ACT nasal spray SHAKE LIQUID AND USE 2 SPRAYS IN EACH NOSTRIL DAILY (Patient taking differently: Place 2 sprays into both nostrils daily.) 16 g 6  . furosemide (LASIX) 20 MG tablet TAKE 1 TABLET(20 MG) BY MOUTH TWICE DAILY 180 tablet 3  . gabapentin (NEURONTIN) 600 MG tablet TAKE 1/2 TO 2 TABLETS(300 TO 1200 MG) BY MOUTH THREE TIMES DAILY AS NEEDED (Patient taking differently: Take 1,800 mg by mouth at bedtime.) 180 tablet 3  . glucose   blood (ACCU-CHEK GUIDE) test strip USE TO CHECK FASTING BLOOD SUGAR TWICE DAILY 100 strip 1  . guaiFENesin (MUCINEX) 600 MG 12 hr tablet Take 1 tablet (600 mg total) by mouth 2 (two) times daily as needed for cough or to loosen phlegm. 30 tablet 0  . Lancets (ONETOUCH DELICA PLUS LANCET33G) MISC USE TO TEST BLOOD SUGAR TWICE DAILY 100 each 3  . metFORMIN (GLUCOPHAGE) 1000 MG tablet Take 1 tablet (1,000 mg total) by mouth 2 (two) times daily with a meal. 180 tablet 2  . metoprolol succinate (TOPROL-XL) 50 MG 24 hr tablet Take with or immediately following a meal. (Patient taking differently: Take by mouth daily.) 90 tablet 1  . neomycin-polymyxin-hydrocortisone (CORTISPORIN) OTIC solution Place 3 drops into the right ear 4 (four) times daily. 10 mL 0  . oxcarbazepine (TRILEPTAL) 600 MG tablet TAKE 1 TABLET(600 MG) BY MOUTH DAILY  (Patient taking differently: Take 600 mg by mouth daily.) 90 tablet 3  . pantoprazole (PROTONIX) 40 MG tablet TAKE 1 TABLET(40 MG) BY MOUTH DAILY (Patient taking differently: Take 40 mg by mouth daily.) 90 tablet 3  . Polyethyl Glycol-Propyl Glycol (SYSTANE OP) Place 1 drop into both eyes daily as needed (dry eyes).    . rosuvastatin (CRESTOR) 20 MG tablet Take 1 tablet (20 mg total) by mouth daily. 90 tablet 3  . triamcinolone cream (KENALOG) 0.1 % Apply 1 application topically 2 (two) times daily. (Patient taking differently: Apply 1 application topically 2 (two) times daily as needed (eczema).) 30 g 0  . TRULICITY 1.5 MG/0.5ML SOPN INJECT 1 PEN INTO THE SKIN ONCE A WEEK 2 mL 3  . valACYclovir (VALTREX) 500 MG tablet Take 1 tablet (500 mg total) by mouth 2 (two) times daily. (Patient taking differently: Take 500 mg by mouth 2 (two) times daily as needed (fever blisters).) 6 tablet 2  . Vitamin A 2400 MCG (8000 UT) CAPS Take 2,400 mcg by mouth daily.    . vitamin E 180 MG (400 UNITS) capsule Take 400 Units by mouth daily.    . zinc gluconate 50 MG tablet Take 50 mg by mouth daily.     No current facility-administered medications on file prior to visit.   Allergies  Allergen Reactions  . Bee Venom Anaphylaxis  . Aspirin Hives, Itching and Swelling  . Codeine     Increases liver enzymes   . Eql Antibacterial Hand Soap [Benzalkonium Chloride] Hives  . Nsaids     Hallucinations   . Reglan [Metoclopramide] Hives   Social History   Socioeconomic History  . Marital status: Married    Spouse name: Not on file  . Number of children: 3  . Years of education: Not on file  . Highest education level: Not on file  Occupational History  . Occupation: Disabled  Tobacco Use  . Smoking status: Former Smoker  . Smokeless tobacco: Never Used  Vaping Use  . Vaping Use: Never used  Substance and Sexual Activity  . Alcohol use: No  . Drug use: No  . Sexual activity: Not on file  Other Topics  Concern  . Not on file  Social History Narrative  . Not on file   Social Determinants of Health   Financial Resource Strain: Low Risk   . Difficulty of Paying Living Expenses: Not very hard  Food Insecurity: Not on file  Transportation Needs: Not on file  Physical Activity: Not on file  Stress: Not on file  Social Connections: Not on file  Intimate   Partner Violence: Not on file      Review of Systems  All other systems reviewed and are negative.      Objective:   Physical Exam Vitals reviewed.  Constitutional:      General: She is not in acute distress.    Appearance: She is well-developed. She is not diaphoretic.  HENT:     Right Ear: External ear normal. A middle ear effusion is present. Tympanic membrane is erythematous and bulging.     Left Ear: Tympanic membrane and external ear normal.     Nose: Mucosal edema and rhinorrhea present.     Right Sinus: Maxillary sinus tenderness and frontal sinus tenderness present.     Left Sinus: Maxillary sinus tenderness and frontal sinus tenderness present.     Mouth/Throat:     Pharynx: No oropharyngeal exudate.  Eyes:     Conjunctiva/sclera: Conjunctivae normal.     Pupils: Pupils are equal, round, and reactive to light.  Cardiovascular:     Rate and Rhythm: Normal rate and regular rhythm.     Heart sounds: Normal heart sounds. No murmur heard. No friction rub. No gallop.   Pulmonary:     Effort: Pulmonary effort is normal. No respiratory distress.     Breath sounds: Normal breath sounds. No wheezing or rales.  Chest:     Chest wall: No tenderness.  Musculoskeletal:     Cervical back: Neck supple.  Lymphadenopathy:     Cervical: No cervical adenopathy.           Assessment & Plan:  Cough - Plan: SARS-COV-2 RNA,(COVID-19) QUAL NAAT  Upper respiratory tract infection, unspecified type  I will screen the patient for Covid.  I recommended quarantining until symptoms improve and test returns negative.  Meanwhile  I recommended tincture of time.  I explained that I believe this is a virus and that antibiotics will not be effective against a virus.  However over Christmas if she develops fever or worsening sinus pain I did give her a prescription for amoxicillin 875 mg p.o. twice daily for 10 days

## 2020-05-07 LAB — SARS-COV-2 RNA,(COVID-19) QUALITATIVE NAAT: SARS CoV2 RNA: NOT DETECTED

## 2020-05-08 ENCOUNTER — Encounter: Payer: Self-pay | Admitting: Family Medicine

## 2020-05-09 ENCOUNTER — Other Ambulatory Visit: Payer: Self-pay | Admitting: Family Medicine

## 2020-05-31 ENCOUNTER — Encounter: Payer: Self-pay | Admitting: Family Medicine

## 2020-06-02 ENCOUNTER — Telehealth: Payer: Self-pay | Admitting: Pharmacist

## 2020-06-02 NOTE — Progress Notes (Addendum)
  Chronic Care Management Pharmacy Assistant   Name: Jacqueline [name]  MRN: [id number] DOB: 01/03/1966  Reason for Encounter: Medication Review   PCP : Pickard, Warren T, MD  Allergies:   Allergies  Allergen Reactions   Bee Venom Anaphylaxis   Aspirin Hives, Itching and Swelling   Codeine     Increases liver enzymes    Eql Antibacterial Hand Soap [Benzalkonium Chloride] Hives   Nsaids     Hallucinations    Reglan [Metoclopramide] Hives    Medications: Outpatient Encounter Medications as of 06/02/2020  Medication Sig   amoxicillin (AMOXIL) 875 MG tablet Take 1 tablet (875 mg total) by mouth 2 (two) times daily.   Ascorbic Acid (VITAMIN C) 1000 MG tablet Take 1,000 mg by mouth daily.   aspirin EC 81 MG tablet Take 81 mg by mouth daily.   Biotin 5 MG CAPS Take 5 mg by mouth daily.   Blood Glucose Monitoring Suppl (BLOOD GLUCOSE SYSTEM PAK) KIT Please dispense based on patient and insurance preference. Use as directed to monitor FSBS 2x daily. Dx: E11.9.   cetirizine (ZYRTEC) 10 MG tablet Take 10 mg by mouth daily.   Cholecalciferol (VITAMIN D3) 125 MCG (5000 UT) CAPS Take 5,000 Units by mouth daily.   Cyanocobalamin (B-12) 2500 MCG TABS Take 2,500 mcg by mouth daily.   DULoxetine (CYMBALTA) 60 MG capsule TAKE TWO CAPSULES BY MOUTH ONCE DAILY   EPINEPHrine 0.3 mg/0.3 mL IJ SOAJ injection Inject 0.3 mg into the muscle as needed for anaphylaxis.   Flaxseed, Linseed, (FLAXSEED OIL PO) Take 1 capsule by mouth daily.   fluticasone (FLONASE) 50 MCG/ACT nasal spray SHAKE LIQUID AND USE 2 SPRAYS IN EACH NOSTRIL DAILY (Patient taking differently: Place 2 sprays into both nostrils daily.)   furosemide (LASIX) 20 MG tablet TAKE 1 TABLET(20 MG) BY MOUTH TWICE DAILY   gabapentin (NEURONTIN) 600 MG tablet TAKE 1/2 TO 2 TABLETS(300 TO 1200 MG) BY MOUTH THREE TIMES DAILY AS NEEDED (Patient taking differently: Take 1,800 mg by mouth at bedtime.)   glucose blood (ACCU-CHEK GUIDE) test strip USE TO  CHECK FASTING BLOOD SUGAR TWICE DAILY   guaiFENesin (MUCINEX) 600 MG 12 hr tablet Take 1 tablet (600 mg total) by mouth 2 (two) times daily as needed for cough or to loosen phlegm.   Lancets (ONETOUCH DELICA PLUS LANCET33G) MISC USE TO TEST BLOOD SUGAR TWICE DAILY   metFORMIN (GLUCOPHAGE) 1000 MG tablet Take 1 tablet (1,000 mg total) by mouth 2 (two) times daily with a meal.   metoprolol succinate (TOPROL-XL) 50 MG 24 hr tablet Take with or immediately following a meal. (Patient taking differently: Take by mouth daily.)   neomycin-polymyxin-hydrocortisone (CORTISPORIN) OTIC solution Place 3 drops into the right ear 4 (four) times daily.   oxcarbazepine (TRILEPTAL) 600 MG tablet TAKE 1 TABLET(600 MG) BY MOUTH DAILY (Patient taking differently: Take 600 mg by mouth daily.)   pantoprazole (PROTONIX) 40 MG tablet TAKE 1 TABLET(40 MG) BY MOUTH DAILY (Patient taking differently: Take 40 mg by mouth daily.)   Polyethyl Glycol-Propyl Glycol (SYSTANE OP) Place 1 drop into both eyes daily as needed (dry eyes).   rosuvastatin (CRESTOR) 20 MG tablet Take 1 tablet (20 mg total) by mouth daily.   triamcinolone cream (KENALOG) 0.1 % Apply 1 application topically 2 (two) times daily. (Patient taking differently: Apply 1 application topically 2 (two) times daily as needed (eczema).)   TRULICITY 1.5 MG/0.5ML SOPN INJECT 1 PEN INTO THE SKIN ONCE A WEEK     valACYclovir (VALTREX) 500 MG tablet Take 1 tablet (500 mg total) by mouth 2 (two) times daily. (Patient taking differently: Take 500 mg by mouth 2 (two) times daily as needed (fever blisters).)   Vitamin A 2400 MCG (8000 UT) CAPS Take 2,400 mcg by mouth daily.   vitamin E 180 MG (400 UNITS) capsule Take 400 Units by mouth daily.   zinc gluconate 50 MG tablet Take 50 mg by mouth daily.   No facility-administered encounter medications on file as of 06/02/2020.    Current Diagnosis: Patient Active Problem List   Diagnosis Date Noted   Upper respiratory tract  infection 05/02/2020   Chest pain 01/31/2014   Dyslipidemia associated with type 2 diabetes mellitus ([place]) 01/31/2014   Diabetic neuropathy associated with diabetes mellitus due to underlying condition ([place]) 01/31/2014   Pseudotumor cerebri 01/31/2014   Essential hypertension, benign 08/05/2012   Other and unspecified hyperlipidemia 08/05/2012   Obesity (BMI 30.0-34.9) 11/18/2011   Diabetes mellitus, type II ([place]) 11/18/2011   Chronic migraine 11/18/2011   GERD 05/29/2009   IRRITABLE BOWEL SYNDROME 05/29/2009   FATTY LIVER DISEASE 05/29/2009    Goals Addressed   None    Reviewed chart for medication changes ahead of medication coordination call.  No Consults, or hospital visits since last care coordination call.  Office Visits: 05/04/20 Dr. Dennard Schaumann. For cough. STARTED Amoxicillin 875 mg 2 times daily for 10 days.  Medication changes( Amoxicillin 875 mg 2 times daily for 10 days.)   BP Readings from Last 3 Encounters:  05/04/20 108/72  03/29/20 104/70  03/02/20 126/80    Lab Results  Component Value Date   HGBA1C 6.5 (H) 02/28/2020     Patient obtains medications through Vials  90 Days   Last adherence delivery included:  Accu-Check Guide test Strips Duloxetine 66m Fluticasone 534m nasal spray Furosemide 2043mGabapentin 600m70mtformin 1000mg42moprolol XL 50mg 86LJouch Delica 33g l44Bets Oxcarbazepine 600mg 63mtoprazole 40mg d21m Rosuvastatin 20mg  P86mnt declined the following medications last month: Trulicity 1.5mg/0.2.0FE/0.7HQreceiving a 90 day supply on 03/31/2020  Patient is not due for an adherence delivery at this time.  Called patient and reviewed medications and coordinated delivery.  This delivery to include: None  Patient declined the following medications: Accu-Check Guide test Strips (90 day supply on 05/11/20) Duloxetine 60mg (9086m supply on 05/11/20) Fluticasone 50mcg nas11mpray (90 day supply on 05/11/20) Furosemide 20mg  (90 36msupply on 05/11/20) Gabapentin 600mg (90 da2mpply on 05/11/20) Metformin 1000mg (90 day71mply on 05/11/20) Metoprolol XL 50mg (90 day 34mly on 05/11/20) Onet19/75/88ica 33g lancets (932Pay on supply 05/11/20) Oxcarbazepine 600mg  (90 day 42mly on 05/11/20) Pantoprazole 40mg daily (90 71msupply on 05/11/20) Rosuvastatin 20mg (90 day sup16mon 05/11/20) Trulici49/82/64/0.5ml (90 d1.5AX/0.9MMn 03/31/2020)  Patient does not need any refills at this time.   Follow-Up:  Coordination of Enhanced Pharmacy Services and Pharmacist Review   Veronica MclemoreCharlann LangearBridgevilletant [phone]  [phone] in review, coordination, and documentation.  Reviewed by: Christian Davis, Beverly Milch Pharmacist [place] [phone]

## 2020-06-18 ENCOUNTER — Other Ambulatory Visit: Payer: Self-pay | Admitting: Family Medicine

## 2020-06-26 ENCOUNTER — Encounter: Payer: Self-pay | Admitting: Family Medicine

## 2020-06-26 MED ORDER — VALACYCLOVIR HCL 500 MG PO TABS
500.0000 mg | ORAL_TABLET | Freq: Two times a day (BID) | ORAL | 3 refills | Status: DC | PRN
Start: 2020-06-26 — End: 2021-08-29

## 2020-07-03 ENCOUNTER — Telehealth: Payer: Self-pay | Admitting: Pharmacist

## 2020-07-03 NOTE — Progress Notes (Addendum)
Chronic Care Management Pharmacy Assistant   Name: Jacqueline [name]  MRN: [id number] DOB: [birth year]  Reason for Encounter: Medication Review  PCP : Susy Frizzle, MD  Allergies:   Allergies  Allergen Reactions   Bee Venom Anaphylaxis   Aspirin Hives, Itching and Swelling   Codeine     Increases liver enzymes    Eql Antibacterial Hand Soap [Benzalkonium Chloride] Hives   Nsaids     Hallucinations    Reglan [Metoclopramide] Hives    Medications: Outpatient Encounter Medications as of 07/03/2020  Medication Sig   amoxicillin (AMOXIL) 875 MG tablet Take 1 tablet (875 mg total) by mouth 2 (two) times daily.   Ascorbic Acid (VITAMIN C) 1000 MG tablet Take 1,000 mg by mouth daily.   aspirin EC 81 MG tablet Take 81 mg by mouth daily.   Biotin 5 MG CAPS Take 5 mg by mouth daily.   Blood Glucose Monitoring Suppl (BLOOD GLUCOSE SYSTEM PAK) KIT Please dispense based on patient and insurance preference. Use as directed to monitor FSBS 2x daily. Dx: E11.9.   cetirizine (ZYRTEC) 10 MG tablet Take 10 mg by mouth daily.   Cholecalciferol (VITAMIN D3) 125 MCG (5000 UT) CAPS Take 5,000 Units by mouth daily.   Cyanocobalamin (B-12) 2500 MCG TABS Take 2,500 mcg by mouth daily.   DULoxetine (CYMBALTA) 60 MG capsule TAKE TWO CAPSULES BY MOUTH ONCE DAILY   EPINEPHrine 0.3 mg/0.3 mL IJ SOAJ injection Inject 0.3 mg into the muscle as needed for anaphylaxis.   Flaxseed, Linseed, (FLAXSEED OIL PO) Take 1 capsule by mouth daily.   fluticasone (FLONASE) 50 MCG/ACT nasal spray SHAKE LIQUID AND USE 2 SPRAYS IN EACH NOSTRIL DAILY (Patient taking differently: Place 2 sprays into both nostrils daily.)   furosemide (LASIX) 20 MG tablet TAKE 1 TABLET(20 MG) BY MOUTH TWICE DAILY   gabapentin (NEURONTIN) 600 MG tablet TAKE 1/2 TO 2 TABLETS(300 TO 1200 MG) BY MOUTH THREE TIMES DAILY AS NEEDED (Patient taking differently: Take 1,800 mg by mouth at bedtime.)   glucose blood (ACCU-CHEK GUIDE) test strip USE TO  check blood sugar TWICE DAILY   guaiFENesin (MUCINEX) 600 MG 12 hr tablet Take 1 tablet (600 mg total) by mouth 2 (two) times daily as needed for cough or to loosen phlegm.   Lancets (ONETOUCH DELICA PLUS [id number]) MISC USE TO TEST BLOOD SUGAR TWICE DAILY   metFORMIN (GLUCOPHAGE) 1000 MG tablet Take 1 tablet (1,000 mg total) by mouth 2 (two) times daily with a meal.   metoprolol succinate (TOPROL-XL) 50 MG 24 hr tablet Take with or immediately following a meal. (Patient taking differently: Take by mouth daily.)   neomycin-polymyxin-hydrocortisone (CORTISPORIN) OTIC solution Place 3 drops into the right ear 4 (four) times daily.   oxcarbazepine (TRILEPTAL) 600 MG tablet TAKE 1 TABLET(600 MG) BY MOUTH DAILY (Patient taking differently: Take 600 mg by mouth daily.)   pantoprazole (PROTONIX) 40 MG tablet TAKE 1 TABLET(40 MG) BY MOUTH DAILY (Patient taking differently: Take 40 mg by mouth daily.)   Polyethyl Glycol-Propyl Glycol (SYSTANE OP) Place 1 drop into both eyes daily as needed (dry eyes).   rosuvastatin (CRESTOR) 20 MG tablet Take 1 tablet (20 mg total) by mouth daily.   triamcinolone cream (KENALOG) 0.1 % Apply 1 application topically 2 (two) times daily. (Patient taking differently: Apply 1 application topically 2 (two) times daily as needed (eczema).)   TRULICITY 1.5 PQ/9.8YM SOPN INJECT 1 PEN INTO THE SKIN ONCE A WEEK   valACYclovir (VALTREX)  500 MG tablet Take 1 tablet (500 mg total) by mouth 2 (two) times daily as needed (fever blisters).   Vitamin A 2400 MCG (8000 UT) CAPS Take 2,400 mcg by mouth daily.   vitamin E 180 MG (400 UNITS) capsule Take 400 Units by mouth daily.   zinc gluconate 50 MG tablet Take 50 mg by mouth daily.   No facility-administered encounter medications on file as of 07/03/2020.    Current Diagnosis: Patient Active Problem List   Diagnosis Date Noted   Upper respiratory tract infection 05/02/2020   Chest pain 01/31/2014   Dyslipidemia associated with type 2  diabetes mellitus ([place]) 01/31/2014   Diabetic neuropathy associated with diabetes mellitus due to underlying condition ([place]) 01/31/2014   Pseudotumor cerebri 01/31/2014   Essential hypertension, benign 08/05/2012   Other and unspecified hyperlipidemia 08/05/2012   Obesity (BMI 30.0-34.9) 11/18/2011   Diabetes mellitus, type II ([place]) 11/18/2011   Chronic migraine 11/18/2011   GERD 05/29/2009   IRRITABLE BOWEL SYNDROME 05/29/2009   FATTY LIVER DISEASE 05/29/2009    Goals Addressed   None    Reviewed chart for medication changes ahead of medication coordination call.  No OVs, Consults, or hospital visits since last care coordination call.  No medication changes indicated.  BP Readings from Last 3 Encounters:  05/04/20 108/72  03/29/20 104/70  03/02/20 126/80    Lab Results  Component Value Date   HGBA1C 6.5 (H) 02/28/2020     Patient obtains medications through Vials  90 Days   Last adherence delivery included: 90 DS on 05/11/20 Accu-Check Guide test Strips Duloxetine 9m Fluticasone 569m nasal spray Furosemide 2081mGabapentin 600m17mtformin 1000mg48moprolol XL 50mg 51VOouch Delica 33g l16Wets Oxcarbazepine 600mg 60mtoprazole 40mg d67m Rosuvastatin 20mg  P4mnt declined (meds) last month: Accu-Check Guide test Strips (90 day supply on 05/11/20) Duloxetine 60mg (9075m supply on 05/11/20) Fluticasone 50mcg nas62mpray (90 day supply on 05/11/20) Furosemide 20mg (90 d8mupply on 05/11/20) Gabapentin 600mg (90 da18mpply on 05/11/20) Metformin 1000mg (90 day71mply on 05/11/20) Metoprolol XL 50mg (90 day 20mly on 05/11/20) Onet73/71/06ica 33g lancets (926Ray on supply 05/11/20) Oxcarbazepine 600mg  (90 day 64mly on 05/11/20) Pantoprazole 40mg daily (90 50msupply on 05/11/20) Rosuvastatin 20mg (90 day sup42mon 05/11/20) Trulici48/54/62/0.5ml (90 d7.0JJ/0.0XFn 03/31/2020) due to PRN use/additional supply on hand.  Patient is not due a adherence  delivery at this time.  Called patient and reviewed medications and coordinated delivery.  This delivery to include: None  Patient declined the following medications: Trulicity 1.5mg/0.5ml (due 8.1WE/9.9BZCheck Guide test Strips (90 day supply on 05/11/20) Duloxetine 60mg (90 day supp57mn 05/11/20) Fluticasone 50mcg nasal spray 13mday supply on 05/11/20) Furosemide 20mg (90 day supply67m12/30/21) Gabapentin 600mg (90 day supply 3m2/30/21) Metformin 1000mg (90 day supply o57m/30/21) Metoprolol XL 50mg (90 day supply on39m30/21) Onetouch Deli16/96/78ancets (90 day on 93Yply 05/11/20) Oxcarbazepine 600mg  (90 day supply on47m30/21) Pantoprazole 40mg daily (90 day suppl40m 05/11/20) Rosuvastatin 20mg (90 day supply on 1296m21)  Patient does not need any refills at this time.   Follow-Up:  Coordination of Enhanced Pharmacy Services and Pharmacist Review   Veronica Mclemore, RMA CliCharlann LangeAsJemez [phone]  7 minutes [phone], coordination, and documentation.  Reviewed by: Christian Davis, PharmD ClBeverly Milchst [place] [facility] MediciYarmouth [phone]

## 2020-07-11 ENCOUNTER — Emergency Department (HOSPITAL_COMMUNITY)
Admission: EM | Admit: 2020-07-11 | Discharge: 2020-07-11 | Disposition: A | Discharge status: Home / Self Care | Payer: Medicare Other | Attending: Emergency Medicine | Admitting: Emergency Medicine

## 2020-07-11 ENCOUNTER — Encounter: Payer: Self-pay | Admitting: Family Medicine

## 2020-07-11 ENCOUNTER — Other Ambulatory Visit: Payer: Self-pay

## 2020-07-11 ENCOUNTER — Ambulatory Visit (INDEPENDENT_AMBULATORY_CARE_PROVIDER_SITE_OTHER): Payer: Medicare Other | Admitting: Family Medicine

## 2020-07-11 ENCOUNTER — Encounter (HOSPITAL_COMMUNITY): Payer: Self-pay | Admitting: Emergency Medicine

## 2020-07-11 VITALS — BP 136/82 | HR 104 | Temp 96.9°F | Ht 62.0 in | Wt 193.0 lb

## 2020-07-11 DIAGNOSIS — R1031 Right lower quadrant pain: Secondary | ICD-10-CM | POA: Diagnosis not present

## 2020-07-11 DIAGNOSIS — Z5321 Procedure and treatment not carried out due to patient leaving prior to being seen by health care provider: Secondary | ICD-10-CM | POA: Insufficient documentation

## 2020-07-11 MED ORDER — METRONIDAZOLE 500 MG PO TABS: 500.0000 mg | ORAL_TABLET | Freq: Two times a day (BID) | ORAL | 0 refills | Status: DC

## 2020-07-11 MED ORDER — CIPROFLOXACIN HCL 500 MG PO TABS: 500.0000 mg | ORAL_TABLET | Freq: Two times a day (BID) | ORAL | 0 refills | Status: DC

## 2020-07-11 NOTE — Progress Notes (Signed)
Subjective:    Patient ID: Jacqueline [name], female    DOB: Jun 29, 1965, 55 y.o.   MRN: [id number]  HPI  Patient is a 55 year old Caucasian female with a history of fatty liver disease, type 2 diabetes, metabolic syndrome who presents with a 53-hour history of right lower quadrant abdominal pain.  She reports it is a constant sharp pain.  Movement makes it worse.  It hurts when she walks.  Any vibration or pressure in the abdomen intensifies the pain.  She states that she felt exactly like this when she had diverticulitis 10 years ago.  She had a CAT scan of the abdomen and pelvis in 2012 that confirmed diverticulitis.  She states she felt exactly the same pain in the exact same area when she had diverticulitis before.  However she still has her appendix.  Today on exam she is tender to palpation in the right lower quadrant.  She has diminished bowel sounds.  There is no guarding.  There is no rebound.  However the patient reports 8 out of 10 pain.  She denies any nausea or vomiting.  She denies any objective fevers however she does report feeling hot and sweaty and clammy all day [name]. Past Medical History:  Diagnosis Date  . Abnormal transaminases   . Allergy    [employer] 03/01/2020  . Anemia   . Anxiety disorder   . Arthritis   . Asthma   . Cancer ([place])    [employer] 03/01/2020  . Cellulitis   . Chronic headaches   . Diabetes mellitus without complication ([place])    [employer] 03/01/2020  . Diverticulitis   . DM (diabetes mellitus) ([place])   . Erosive esophagitis   . Fatty liver   . GERD (gastroesophageal reflux disease)   . Hyperlipidemia    [employer] 03/01/2020  . IBS (irritable bowel syndrome)   . Kidney stones   . Neuromuscular disorder ([place])    [employer] 03/01/2020  . Obesity   . Pneumonia   . Pseudotumor cerebri   . Sinus tachycardia    Past Surgical History:  Procedure Laterality Date  . ABDOMINAL HYSTERECTOMY N/A    [employer] 03/01/2020  . ANKLE SURGERY    . COLONOSCOPY   06/19/2009  . COLONOSCOPY WITH PROPOFOL N/A 03/29/2020   Procedure: COLONOSCOPY WITH PROPOFOL;  Surgeon: Harvel Quale, MD;  Location: [facility];  Service: Gastroenterology;  Laterality: N/A;  9:00  . EGD  06/09/2009  . FOOT SURGERY    . NASAL SEPTUM SURGERY    . ovarian cysts     x3  . TONSILLECTOMY    . TUBAL LIGATION    . VAGINAL HYSTERECTOMY     Current Outpatient Medications on File Prior to Visit  Medication Sig Dispense Refill  . amoxicillin (AMOXIL) 875 MG tablet Take 1 tablet (875 mg total) by mouth 2 (two) times daily. 20 tablet 0  . Ascorbic Acid (VITAMIN C) 1000 MG tablet Take 1,000 mg by mouth daily.    Marland Kitchen aspirin EC 81 MG tablet Take 81 mg by mouth daily.    . Biotin 5 MG CAPS Take 5 mg by mouth daily.    . Blood Glucose Monitoring Suppl (BLOOD GLUCOSE SYSTEM PAK) KIT Please dispense based on patient and insurance preference. Use as directed to monitor FSBS 2x daily. Dx: E11.9. 1 kit 1  . cetirizine (ZYRTEC) 10 MG tablet Take 10 mg by mouth daily.    . Cholecalciferol (VITAMIN D3) 125 MCG (5000 UT) CAPS Take  5,000 Units by mouth daily.    . Cyanocobalamin (B-12) 2500 MCG TABS Take 2,500 mcg by mouth daily.    . DULoxetine (CYMBALTA) 60 MG capsule TAKE TWO CAPSULES BY MOUTH ONCE DAILY 60 capsule 3  . EPINEPHrine 0.3 mg/0.3 mL IJ SOAJ injection Inject 0.3 mg into the muscle as needed for anaphylaxis.    . Flaxseed, Linseed, (FLAXSEED OIL PO) Take 1 capsule by mouth daily.    . fluticasone (FLONASE) 50 MCG/ACT nasal spray SHAKE LIQUID AND USE 2 SPRAYS IN EACH NOSTRIL DAILY (Patient taking differently: Place 2 sprays into both nostrils daily.) 16 g 6  . furosemide (LASIX) 20 MG tablet TAKE 1 TABLET(20 MG) BY MOUTH TWICE DAILY 180 tablet 3  . gabapentin (NEURONTIN) 600 MG tablet TAKE 1/2 TO 2 TABLETS(300 TO 1200 MG) BY MOUTH THREE TIMES DAILY AS NEEDED (Patient taking differently: Take 1,800 mg by mouth at bedtime.) 180 tablet 3  . glucose blood (ACCU-CHEK GUIDE) test  strip USE TO check blood sugar TWICE DAILY 100 strip 1  . guaiFENesin (MUCINEX) 600 MG 12 hr tablet Take 1 tablet (600 mg total) by mouth 2 (two) times daily as needed for cough or to loosen phlegm. 30 tablet 0  . Lancets (ONETOUCH DELICA PLUS [id number]) MISC USE TO TEST BLOOD SUGAR TWICE DAILY 100 each 3  . metFORMIN (GLUCOPHAGE) 1000 MG tablet Take 1 tablet (1,000 mg total) by mouth 2 (two) times daily with a meal. 180 tablet 2  . metoprolol succinate (TOPROL-XL) 50 MG 24 hr tablet Take with or immediately following a meal. (Patient taking differently: Take by mouth daily.) 90 tablet 1  . neomycin-polymyxin-hydrocortisone (CORTISPORIN) OTIC solution Place 3 drops into the right ear 4 (four) times daily. 10 mL 0  . oxcarbazepine (TRILEPTAL) 600 MG tablet TAKE 1 TABLET(600 MG) BY MOUTH DAILY (Patient taking differently: Take 600 mg by mouth daily.) 90 tablet 3  . pantoprazole (PROTONIX) 40 MG tablet TAKE 1 TABLET(40 MG) BY MOUTH DAILY (Patient taking differently: Take 40 mg by mouth daily.) 90 tablet 3  . Polyethyl Glycol-Propyl Glycol (SYSTANE OP) Place 1 drop into both eyes daily as needed (dry eyes).    . rosuvastatin (CRESTOR) 20 MG tablet Take 1 tablet (20 mg total) by mouth daily. 90 tablet 3  . triamcinolone cream (KENALOG) 0.1 % Apply 1 application topically 2 (two) times daily. (Patient taking differently: Apply 1 application topically 2 (two) times daily as needed (eczema).) 30 g 0  . TRULICITY 1.5 LT/5.3UY SOPN INJECT 1 PEN INTO THE SKIN ONCE A WEEK 2 mL 3  . valACYclovir (VALTREX) 500 MG tablet Take 1 tablet (500 mg total) by mouth 2 (two) times daily as needed (fever blisters). 6 tablet 3  . Vitamin A 2400 MCG (8000 UT) CAPS Take 2,400 mcg by mouth daily.    . vitamin E 180 MG (400 UNITS) capsule Take 400 Units by mouth daily.    Marland Kitchen zinc gluconate 50 MG tablet Take 50 mg by mouth daily.     No current facility-administered medications on file prior to visit.   Allergies  Allergen  Reactions  . Bee Venom Anaphylaxis  . Aspirin Hives, Itching and Swelling  . Codeine     Increases liver enzymes   . Eql Antibacterial Hand Soap [Benzalkonium Chloride] Hives  . Nsaids     Hallucinations   . Reglan [Metoclopramide] Hives   Social History   Socioeconomic History  . Marital status: Married    Spouse name: Not on  file  . Number of children: 3  . Years of education: Not on file  . Highest education level: Not on file  Occupational History  . Occupation: Disabled  Tobacco Use  . Smoking status: Former Research scientist (life sciences)  . Smokeless tobacco: Never Used  Vaping Use  . Vaping Use: Never used  Substance and Sexual Activity  . Alcohol use: No  . Drug use: No  . Sexual activity: Not on file  Other Topics Concern  . Not on file  Social History Narrative  . Not on file   Social Determinants of Health   Financial Resource Strain: Low Risk   . Difficulty of Paying Living Expenses: Not very hard  Food Insecurity: Not on file  Transportation Needs: Not on file  Physical Activity: Not on file  Stress: Not on file  Social Connections: Not on file  Intimate Partner Violence: Not on file     Review of Systems  Gastrointestinal: Positive for abdominal pain.  All other systems reviewed and are negative.      Objective:   Physical Exam Vitals reviewed.  Constitutional:      General: She is not in acute distress.    Appearance: She is obese. She is not ill-appearing, toxic-appearing or diaphoretic.  Cardiovascular:     Rate and Rhythm: Normal rate and regular rhythm.     Heart sounds: No murmur heard.   Pulmonary:     Effort: Pulmonary effort is normal. No respiratory distress.     Breath sounds: Normal breath sounds. No stridor. No wheezing, rhonchi or rales.  Chest:     Chest wall: No tenderness.  Abdominal:     General: Bowel sounds are decreased. There is no distension.     Palpations: Abdomen is soft.     Tenderness: There is abdominal tenderness in the right  lower quadrant. There is no guarding or rebound. Positive signs include McBurney's sign.     Hernia: No hernia is present.    Neurological:     Mental Status: She is alert.           Assessment & Plan:  .RLQ abdominal pain  Patient has a 24-hour history of 8 out of 10 right lower quadrant abdominal pain.  I explained to the patient that this is the classic area for appendicitis.  I do not feel comfortable calling this diverticulitis without a CT scan.  Unfortunately the only way to get a CT scan urgently this afternoon will be to go the emergency room.  I recommended that she go to the ER immediately.  Patient would like to try antibiotics for diverticulitis first.  She states that this exactly how it felt when she had diverticulitis several years ago.  I explained to the patient that if this is appendicitis, antibiotics will not help the pain.  Its only going to worsen and potentially make her sicker.  However after discussing the situation with her she would like to start the antibiotics and then drive to the emergency room and wait to be seen.  She realizes it is likely going to take several hours before she is seen.  If the pain worsens she will wait to be seen.  If the pain improves after taking the antibiotics she will continue to allow the medication time to take effect.  I believe that this is reasonable.  I did send Flagyl 500 mg p.o. twice daily for 10 days and Cipro 500 mg p.o. twice daily for 10 days to her pharmacy.  She will take the medication immediately and then drive to the emergency room and wait to be seen.  If the pain improves she will continue to take the antibiotics for possible diverticulitis given that this was the previous presentation of her episode of diverticulitis.  If the pain is not improving she will wait to be seen so that she can get the CT scan.  I agreed to this mainly to try to avoid stressing the emergency room any further than it already is during the pandemic  and therefore I am willing to try empiric therapy as [name] as the patient feels comfortable with this plan which she does.

## 2020-07-11 NOTE — ED Triage Notes (Signed)
Pt c/o RLQ pain, states she saw her PCP today who told her to come up here

## 2020-07-12 ENCOUNTER — Emergency Department (HOSPITAL_COMMUNITY): Payer: Medicare Other

## 2020-07-12 ENCOUNTER — Other Ambulatory Visit: Payer: Self-pay

## 2020-07-12 ENCOUNTER — Encounter (HOSPITAL_COMMUNITY): Payer: Self-pay | Admitting: Emergency Medicine

## 2020-07-12 ENCOUNTER — Emergency Department (HOSPITAL_COMMUNITY)
Admission: EM | Admit: 2020-07-12 | Discharge: 2020-07-12 | Disposition: A | Discharge status: Home / Self Care | Payer: Medicare Other | Attending: Emergency Medicine | Admitting: Emergency Medicine

## 2020-07-12 DIAGNOSIS — J45909 Unspecified asthma, uncomplicated: Secondary | ICD-10-CM | POA: Insufficient documentation

## 2020-07-12 DIAGNOSIS — R110 Nausea: Secondary | ICD-10-CM | POA: Insufficient documentation

## 2020-07-12 DIAGNOSIS — R1031 Right lower quadrant pain: Secondary | ICD-10-CM

## 2020-07-12 DIAGNOSIS — N390 Urinary tract infection, site not specified: Secondary | ICD-10-CM

## 2020-07-12 DIAGNOSIS — Z859 Personal history of malignant neoplasm, unspecified: Secondary | ICD-10-CM | POA: Diagnosis not present

## 2020-07-12 DIAGNOSIS — Z7982 Long term (current) use of aspirin: Secondary | ICD-10-CM | POA: Diagnosis not present

## 2020-07-12 DIAGNOSIS — I10 Essential (primary) hypertension: Secondary | ICD-10-CM | POA: Insufficient documentation

## 2020-07-12 DIAGNOSIS — Z87891 Personal history of nicotine dependence: Secondary | ICD-10-CM | POA: Diagnosis not present

## 2020-07-12 DIAGNOSIS — E1140 Type 2 diabetes mellitus with diabetic neuropathy, unspecified: Secondary | ICD-10-CM | POA: Diagnosis not present

## 2020-07-12 DIAGNOSIS — K219 Gastro-esophageal reflux disease without esophagitis: Secondary | ICD-10-CM | POA: Insufficient documentation

## 2020-07-12 DIAGNOSIS — Z79899 Other long term (current) drug therapy: Secondary | ICD-10-CM | POA: Diagnosis not present

## 2020-07-12 DIAGNOSIS — Z7984 Long term (current) use of oral hypoglycemic drugs: Secondary | ICD-10-CM | POA: Diagnosis not present

## 2020-07-12 DIAGNOSIS — R6883 Chills (without fever): Secondary | ICD-10-CM | POA: Diagnosis not present

## 2020-07-12 DIAGNOSIS — R630 Anorexia: Secondary | ICD-10-CM | POA: Insufficient documentation

## 2020-07-12 LAB — COMPREHENSIVE METABOLIC PANEL
ALT: 82 U/L — ABNORMAL HIGH (ref 0–44)
AST: 112 U/L — ABNORMAL HIGH (ref 15–41)
Albumin: 4.4 g/dL (ref 3.5–5.0)
Alkaline Phosphatase: 72 U/L (ref 38–126)
Anion gap: 16 — ABNORMAL HIGH (ref 5–15)
BUN: 13 mg/dL (ref 6–20)
CO2: 27 mmol/L (ref 22–32)
Calcium: 9.7 mg/dL (ref 8.9–10.3)
Chloride: 94 mmol/L — ABNORMAL LOW (ref 98–111)
Creatinine, Ser: 0.73 mg/dL (ref 0.44–1.00)
GFR, Estimated: >60 mL/min (ref >60)
Glucose, Bld: 122 mg/dL — ABNORMAL HIGH (ref 70–99)
Potassium: 4.1 mmol/L (ref 3.5–5.1)
Sodium: 137 mmol/L (ref 135–145)
Total Bilirubin: 0.5 mg/dL (ref 0.3–1.2)
Total Protein: 7.9 g/dL (ref 6.5–8.1)

## 2020-07-12 LAB — CBC WITH DIFFERENTIAL/PLATELET
Abs Immature Granulocytes: 0.01 10*3/uL (ref 0.00–0.07)
Basophils Absolute: 0 10*3/uL (ref 0.0–0.1)
Basophils Relative: 0 %
Eosinophils Absolute: 0.1 10*3/uL (ref 0.0–0.5)
Eosinophils Relative: 2 %
HCT: 41.3 % (ref 36.0–46.0)
Hemoglobin: 13.4 g/dL (ref 12.0–15.0)
Immature Granulocytes: 0 %
Lymphocytes Relative: 38 %
Lymphs Abs: 2.6 10*3/uL (ref 0.7–4.0)
MCH: 29.5 pg (ref 26.0–34.0)
MCHC: 32.4 g/dL (ref 30.0–36.0)
MCV: 90.8 fL (ref 80.0–100.0)
Monocytes Absolute: 0.7 10*3/uL (ref 0.1–1.0)
Monocytes Relative: 10 %
Neutro Abs: 3.4 10*3/uL (ref 1.7–7.7)
Neutrophils Relative %: 50 %
Platelets: 301 10*3/uL (ref 150–400)
RBC: 4.55 MIL/uL (ref 3.87–5.11)
RDW: 13.5 % (ref 11.5–15.5)
WBC: 6.8 10*3/uL (ref 4.0–10.5)
nRBC: 0 % (ref 0.0–0.2)

## 2020-07-12 LAB — URINALYSIS, ROUTINE W REFLEX MICROSCOPIC
Bilirubin Urine: NEGATIVE
Glucose, UA: NEGATIVE mg/dL
Hgb urine dipstick: NEGATIVE
Ketones, ur: NEGATIVE mg/dL
Nitrite: NEGATIVE
Protein, ur: NEGATIVE mg/dL
Specific Gravity, Urine: 1.004 — ABNORMAL LOW (ref 1.005–1.030)
pH: 7 (ref 5.0–8.0)

## 2020-07-12 LAB — LIPASE, BLOOD: Lipase: 34 U/L (ref 11–51)

## 2020-07-12 MED ORDER — ONDANSETRON HCL 4 MG/2ML IJ SOLN
4.0000 mg | Freq: Once | INTRAMUSCULAR | Status: AC
  Administered 2020-07-12: 4 mg via INTRAVENOUS
  Filled 2020-07-12: qty 2

## 2020-07-12 MED ORDER — SODIUM CHLORIDE 0.9 % IV BOLUS
1000.0000 mL | Freq: Once | INTRAVENOUS | Status: AC
  Administered 2020-07-12: 1000 mL via INTRAVENOUS

## 2020-07-12 MED ORDER — SODIUM CHLORIDE 0.9 % IV SOLN
1.0000 g | Freq: Once | INTRAVENOUS | Status: AC
  Administered 2020-07-12: 1 g via INTRAVENOUS
  Filled 2020-07-12: qty 10

## 2020-07-12 MED ORDER — IOHEXOL 300 MG/ML  SOLN
100.0000 mL | Freq: Once | INTRAMUSCULAR | Status: AC | PRN
  Administered 2020-07-12: 100 mL via INTRAVENOUS

## 2020-07-12 MED ORDER — MORPHINE SULFATE (PF) 4 MG/ML IV SOLN
4.0000 mg | Freq: Once | INTRAVENOUS | Status: AC
  Administered 2020-07-12: 4 mg via INTRAVENOUS
  Filled 2020-07-12: qty 1

## 2020-07-12 NOTE — ED Provider Notes (Signed)
[facility] EMERGENCY DEPARTMENT Provider Note   CSN: [id number] Arrival date & time: 07/12/20  5093     History Chief Complaint  Patient presents with  . Abdominal Pain    Jacqueline [name] is a 55 y.o. female.  She is here with 3 days of right lower quadrant abdominal pain.  She says it started gradually as an ache and has become more intense.  Associated with some nausea.  No vomiting diarrhea constipation or urine symptoms.  No fevers but has been feeling chilled.  Saw PCP yesterday and he told her to come to the emergency department for a CAT scan but the wait was too long so she left.  He prescribe her Cipro and Flagyl for possible diverticulitis at her request but she has not started them yet.  Decreased appetite.  History of total hysterectomy.  No vaginal bleeding.  The history is provided by the patient.  Abdominal Pain Pain location:  RLQ Pain quality: aching and stabbing   Pain radiates to:  Does not radiate Pain severity:  Severe Onset quality:  Gradual Duration:  3 days Timing:  Constant Progression:  Worsening Chronicity:  New Context: not trauma   Relieved by:  Nothing Worsened by:  Nothing Ineffective treatments:  None tried Associated symptoms: anorexia, chills and nausea   Associated symptoms: no chest pain, no constipation, no cough, no diarrhea, no dysuria, no fever, no hematemesis, no hematochezia, no hematuria, no shortness of breath, no sore throat, no vaginal bleeding, no vaginal discharge and no vomiting        Past Medical History:  Diagnosis Date  . Abnormal transaminases   . Allergy    Phreesia 03/01/2020  . Anemia   . Anxiety disorder   . Arthritis   . Asthma   . Cancer ([place])    Phreesia 03/01/2020  . Cellulitis   . Chronic headaches   . Diabetes mellitus without complication ([place])    Phreesia 03/01/2020  . Diverticulitis   . DM (diabetes mellitus) ([place])   . Erosive esophagitis   . Fatty liver   . GERD (gastroesophageal reflux disease)    . Hyperlipidemia    Phreesia 03/01/2020  . IBS (irritable bowel syndrome)   . Kidney stones   . Neuromuscular disorder ([place])    Phreesia 03/01/2020  . Obesity   . Pneumonia   . Pseudotumor cerebri   . Sinus tachycardia     Patient Active Problem List   Diagnosis Date Noted  . Upper respiratory tract infection 05/02/2020  . Chest pain 01/31/2014  . Dyslipidemia associated with type 2 diabetes mellitus ([place]) 01/31/2014  . Diabetic neuropathy associated with diabetes mellitus due to underlying condition ([place]) 01/31/2014  . Pseudotumor cerebri 01/31/2014  . Essential hypertension, benign 08/05/2012  . Other and unspecified hyperlipidemia 08/05/2012  . Obesity (BMI 30.0-34.9) 11/18/2011  . Diabetes mellitus, type II ([place]) 11/18/2011  . Chronic migraine 11/18/2011  . GERD 05/29/2009  . IRRITABLE BOWEL SYNDROME 05/29/2009  . FATTY LIVER DISEASE 05/29/2009    Past Surgical History:  Procedure Laterality Date  . ABDOMINAL HYSTERECTOMY N/A    Phreesia 03/01/2020  . ANKLE SURGERY    . COLONOSCOPY  06/19/2009  . COLONOSCOPY WITH PROPOFOL N/A 03/29/2020   Procedure: COLONOSCOPY WITH PROPOFOL;  Surgeon: Harvel Quale, MD;  Location: [facility];  Service: Gastroenterology;  Laterality: N/A;  9:00  . EGD  06/09/2009  . FOOT SURGERY    . NASAL SEPTUM SURGERY    . ovarian cysts  x3  . TONSILLECTOMY    . TUBAL LIGATION    . VAGINAL HYSTERECTOMY       OB History   No obstetric history on file.     Family History  Problem Relation Age of Onset  . Colitis Other   . Crohn's disease Other   . Breast cancer Other   . Ovarian cancer Other   . Celiac disease Other   . Clotting disorder Other   . Cystic fibrosis Other        pat. cousin  . Diabetes Mother   . Heart disease Other   . Irritable bowel syndrome Other   . Kidney failure Other 31       mat. nephew  . Diabetes Maternal Aunt   . Diabetes Son   . Diabetes Other        mat. nephew  . Colon cancer Neg  Hx     Social History   Tobacco Use  . Smoking status: Former Research scientist (life sciences)  . Smokeless tobacco: Never Used  Vaping Use  . Vaping Use: Never used  Substance Use Topics  . Alcohol use: No  . Drug use: No    Home Medications Prior to Admission medications   Medication Sig Start Date End Date Taking? Authorizing Provider  amoxicillin (AMOXIL) 875 MG tablet Take 1 tablet (875 mg total) by mouth 2 (two) times daily. 05/04/20   Susy Frizzle, MD  Ascorbic Acid (VITAMIN C) 1000 MG tablet Take 1,000 mg by mouth daily.    [provider]  aspirin EC 81 MG tablet Take 81 mg by mouth daily.    [provider]  Biotin 5 MG CAPS Take 5 mg by mouth daily.    [provider]  Blood Glucose Monitoring Suppl (BLOOD GLUCOSE SYSTEM PAK) KIT Please dispense based on patient and insurance preference. Use as directed to monitor FSBS 2x daily. Dx: E11.9. 09/30/19   Susy Frizzle, MD  cetirizine (ZYRTEC) 10 MG tablet Take 10 mg by mouth daily.    [provider]  Cholecalciferol (VITAMIN D3) 125 MCG (5000 UT) CAPS Take 5,000 Units by mouth daily.    [provider]  ciprofloxacin (CIPRO) 500 MG tablet Take 1 tablet (500 mg total) by mouth 2 (two) times daily. 07/11/20   Susy Frizzle, MD  Cyanocobalamin (B-12) 2500 MCG TABS Take 2,500 mcg by mouth daily.    [provider]  DULoxetine (CYMBALTA) 60 MG capsule TAKE TWO CAPSULES BY MOUTH ONCE DAILY 05/01/20   Susy Frizzle, MD  EPINEPHrine 0.3 mg/0.3 mL IJ SOAJ injection Inject 0.3 mg into the muscle as needed for anaphylaxis.    [provider]  Flaxseed, Linseed, (FLAXSEED OIL PO) Take 1 capsule by mouth daily.    [provider]  fluticasone (FLONASE) 50 MCG/ACT nasal spray SHAKE LIQUID AND USE 2 SPRAYS IN EACH NOSTRIL DAILY Patient taking differently: Place 2 sprays into both nostrils daily. 03/01/20   Susy Frizzle, MD  furosemide (LASIX) 20 MG tablet TAKE 1 TABLET(20 MG)  BY MOUTH TWICE DAILY 04/10/20   Susy Frizzle, MD  gabapentin (NEURONTIN) 600 MG tablet TAKE 1/2 TO 2 TABLETS(300 TO 1200 MG) BY MOUTH THREE TIMES DAILY AS NEEDED Patient taking differently: Take 1,800 mg by mouth at bedtime. 03/01/20   Susy Frizzle, MD  glucose blood (ACCU-CHEK GUIDE) test strip USE TO check blood sugar TWICE DAILY 06/19/20   Susy Frizzle, MD  guaiFENesin ([place]) 600 MG 12  hr tablet Take 1 tablet (600 mg total) by mouth 2 (two) times daily as needed for cough or to loosen phlegm. 05/02/20   Eulogio Bear, NP  Lancets (ONETOUCH DELICA PLUS [id number]) MISC USE TO TEST BLOOD SUGAR TWICE DAILY 03/01/20   Susy Frizzle, MD  metFORMIN (GLUCOPHAGE) 1000 MG tablet Take 1 tablet (1,000 mg total) by mouth 2 (two) times daily with a meal. 03/01/20   Susy Frizzle, MD  metoprolol succinate (TOPROL-XL) 50 MG 24 hr tablet Take with or immediately following a meal. Patient taking differently: Take by mouth daily. 03/01/20   Susy Frizzle, MD  metroNIDAZOLE (FLAGYL) 500 MG tablet Take 1 tablet (500 mg total) by mouth 2 (two) times daily. 07/11/20   Susy Frizzle, MD  neomycin-polymyxin-hydrocortisone (CORTISPORIN) OTIC solution Place 3 drops into the right ear 4 (four) times daily. 11/16/19   Susy Frizzle, MD  oxcarbazepine (TRILEPTAL) 600 MG tablet TAKE 1 TABLET(600 MG) BY MOUTH DAILY Patient taking differently: Take 600 mg by mouth daily. 03/01/20   Susy Frizzle, MD  pantoprazole (PROTONIX) 40 MG tablet TAKE 1 TABLET(40 MG) BY MOUTH DAILY Patient taking differently: Take 40 mg by mouth daily. 03/01/20   Susy Frizzle, MD  Polyethyl Glycol-Propyl Glycol (SYSTANE OP) Place 1 drop into both eyes daily as needed (dry eyes).    [provider]  rosuvastatin (CRESTOR) 20 MG tablet Take 1 tablet (20 mg total) by mouth daily. 03/01/20   Susy Frizzle, MD  triamcinolone cream (KENALOG) 0.1 % Apply 1 application topically 2 (two) times  daily. Patient taking differently: Apply 1 application topically 2 (two) times daily as needed (eczema). 01/10/20   Susy Frizzle, MD  TRULICITY 1.5 PF/2.9WK SOPN INJECT 1 PEN INTO THE SKIN ONCE A WEEK 03/31/20   Susy Frizzle, MD  valACYclovir (VALTREX) 500 MG tablet Take 1 tablet (500 mg total) by mouth 2 (two) times daily as needed (fever blisters). 06/26/20   Susy Frizzle, MD  Vitamin A 2400 MCG (8000 UT) CAPS Take 2,400 mcg by mouth daily.    [provider]  vitamin E 180 MG (400 UNITS) capsule Take 400 Units by mouth daily.    [provider]  zinc gluconate 50 MG tablet Take 50 mg by mouth daily.    [provider]    Allergies    Bee venom, Aspirin, Codeine, Eql antibacterial hand soap [benzalkonium chloride], Nsaids, and Reglan [metoclopramide]  Review of Systems   Review of Systems  Constitutional: Positive for chills. Negative for fever.  HENT: Negative for sore throat.   Eyes: Negative for visual disturbance.  Respiratory: Negative for cough and shortness of breath.   Cardiovascular: Negative for chest pain.  Gastrointestinal: Positive for abdominal pain, anorexia and nausea. Negative for constipation, diarrhea, hematemesis, hematochezia and vomiting.  Genitourinary: Negative for dysuria, hematuria, vaginal bleeding and vaginal discharge.  Musculoskeletal: Negative for neck pain.  Skin: Negative for rash.  Neurological: Negative for headaches.    Physical Exam Updated Vital Signs BP (!) 142/100   Pulse 91   Temp 98.6 F (37 C) (Oral)   Ht 5' 2"  (1.575 m)   Wt 87.5 kg   SpO2 97%   BMI 35.28 kg/m   Physical Exam Vitals and nursing note reviewed.  Constitutional:      General: She is not in acute distress.    Appearance: Normal appearance. She is well-developed and well-nourished.  HENT:     Head: Normocephalic  and atraumatic.  Eyes:     Conjunctiva/sclera: Conjunctivae normal.  Cardiovascular:     Rate and Rhythm: Normal  rate and regular rhythm.     Heart sounds: No murmur heard.   Pulmonary:     Effort: Pulmonary effort is normal. No respiratory distress.     Breath sounds: Normal breath sounds.  Abdominal:     Palpations: Abdomen is soft.     Tenderness: There is abdominal tenderness in the right lower quadrant. There is no guarding or rebound.  Musculoskeletal:        General: No deformity, signs of injury or edema. Normal range of motion.     Cervical back: Neck supple.  Skin:    General: Skin is warm and dry.  Neurological:     General: No focal deficit present.     Mental Status: She is alert.  Psychiatric:        Mood and Affect: Mood and affect normal.     ED Results / Procedures / Treatments   Labs (all labs ordered are listed, but only abnormal results are displayed) Labs Reviewed  COMPREHENSIVE METABOLIC PANEL - Abnormal; Notable for the following components:      Result Value   Chloride 94 (*)    Glucose, Bld 122 (*)    AST 112 (*)    ALT 82 (*)    Anion gap 16 (*)    All other components within normal limits  URINALYSIS, ROUTINE W REFLEX MICROSCOPIC - Abnormal; Notable for the following components:   Color, Urine STRAW (*)    Specific Gravity, Urine 1.004 (*)    Leukocytes,Ua LARGE (*)    Bacteria, UA RARE (*)    Non Squamous Epithelial 0-5 (*)    All other components within normal limits  URINE CULTURE  LIPASE, BLOOD  CBC WITH DIFFERENTIAL/PLATELET  CBC WITH DIFFERENTIAL/PLATELET    EKG None  Radiology CT Abdomen Pelvis W Contrast  Result Date: 07/12/2020 CLINICAL DATA:  Right lower quadrant pain and nausea for 3 weeks. EXAM: CT ABDOMEN AND PELVIS WITH CONTRAST TECHNIQUE: Multidetector CT imaging of the abdomen and pelvis was performed using the standard protocol following bolus administration of intravenous contrast. CONTRAST:  100 mL OMNIPAQUE IOHEXOL 300 MG/ML  SOLN COMPARISON:  CT abdomen and pelvis 04/03/2005. FINDINGS: Lower chest: Minimal bibasilar  atelectasis. Lung bases otherwise clear. No pleural or pericardial effusion. Hepatobiliary: No focal liver abnormality is seen. No gallstones, gallbladder wall thickening, or biliary dilatation. The liver is diffusely low attenuating consistent with fatty infiltration. The liver measures 23 cm craniocaudal. Pancreas: Unremarkable. No pancreatic ductal dilatation or surrounding inflammatory changes. Spleen: Normal in size without focal abnormality. Adrenals/Urinary Tract: Adrenal glands are unremarkable. Kidneys are normal, without renal calculi, focal lesion, or hydronephrosis. Bladder is unremarkable. Stomach/Bowel: Stomach is within normal limits. Appendix appears normal. No evidence of bowel wall thickening, distention, or inflammatory changes. Scattered diverticular disease noted. Vascular/Lymphatic: Aortic atherosclerosis. No enlarged abdominal or pelvic lymph nodes. Reproductive: Status post hysterectomy. No adnexal masses. Other: None. Musculoskeletal: No acute or focal abnormality. Mild osteitis condensans ilii incidentally noted. IMPRESSION: Negative for appendicitis.  No acute abnormality abdomen or pelvis. Hepatomegaly and fatty infiltration of the liver. Diverticulosis without diverticulitis. Aortic Atherosclerosis (ICD10-I70.0). Electronically Signed   By: Inge Rise M.D.   On: 07/12/2020 10:09    Procedures Procedures   Medications Ordered in ED Medications  ondansetron (ZOFRAN) injection 4 mg (4 mg Intravenous Given 07/12/20 0832)  sodium chloride 0.9 % bolus 1,000 mL (  0 mLs Intravenous Stopped 07/12/20 1028)  morphine 4 MG/ML injection 4 mg (4 mg Intravenous Given 07/12/20 0832)  iohexol (OMNIPAQUE) 300 MG/ML solution 100 mL (100 mLs Intravenous Contrast Given 07/12/20 0927)  cefTRIAXone (ROCEPHIN) 1 g in sodium chloride 0.9 % 100 mL IVPB (0 g Intravenous Stopped 07/12/20 1128)    ED Course  I have reviewed the triage vital signs and the nursing notes.  Pertinent labs & imaging results  that were available during my care of the patient were reviewed by me and considered in my medical decision making (see chart for details).  Clinical Course as of 07/12/20 1720  Wed Jul 12, 2020  0923 Patient's AST and ALT are mildly elevated.  She said she has had problems with elevated LFTs in the past and her doctors done a work-up including for hemochromatosis. [MB]  74 CT not showing any acute intra-abdominal pathology.  Urinalysis showing possible UTI.  Urine culture sent.  Rest of your lab work looked fairly normal other than the LFTs noted before.  We will give a dose of IV ceftriaxone.  Reviewed work-up with patient. [MB]  1150 Patient tolerating p.o.  She is already have prescriptions for Cipro and Flagyl at home.  I asked her to take the Cipro and not use the Flagyl.  She said she feels her symptoms are manageable at home and she does not want prescriptions for Zofran or pain medicine.  Recommended close follow-up with PCP and return instructions discussed [MB]    Clinical Course User Index [MB] Hayden Rasmussen, MD   MDM Rules/Calculators/A&P                         This patient complains of abdominal pain; this involves an extensive number of treatment Options and is a complaint that carries with it a high risk of complications and Morbidity. The differential includes appendicitis, colitis abscess, enteritis, musculoskeletal, hernia, UTI pyelonephritis nephrolithiasis  I ordered, reviewed and interpreted labs, which included CBC with normal white count chemistries fairly normal other than some mild elevation of AST ALT seen prior to urinalysis with signs of infection 21-50 whites I ordered medication IV fluids IV pain medication nausea medication IV antibiotics I ordered imaging studies which included pelvis and I independently    visualized and interpreted imaging which showed no acute intra-abdominal findings Patient symptoms Previous records obtained and reviewed in epic  including her PCP visit  After the interventions stated above, I reevaluated the patient and found patient's pain to be improved.  She is tolerating p.o.  Current work-up with her and she is comfortable plan for oral antibiotics.  She was already prescribed Flagyl and Cipro by her PCP.  Recommended she take the Cipro and hold the Flagyl.  She does not want any pain or nausea medication..  Recommended close PCP follow-up.  Return instructions discussed   Final Clinical Impression(s) / ED Diagnoses Final diagnoses:  RLQ abdominal pain  Lower urinary tract infectious disease    Rx / DC Orders ED Discharge Orders    None       Hayden Rasmussen, MD 07/12/20 1736

## 2020-07-12 NOTE — ED Notes (Signed)
Pt tolerated PO fluids well. MD made aware.

## 2020-07-12 NOTE — ED Triage Notes (Signed)
Pt c/o of RLQ pain. PCP told her to come yesterday, she did and LWBS. Pt states she has had the pain for two days.

## 2020-07-12 NOTE — Discharge Instructions (Signed)
You were seen in the emergency department for lower abdominal pain.  You had blood work urinalysis and a CAT scan of your abdomen and pelvis that did not show any evidence of appendicitis or diverticulitis.  Your urinalysis did look like it was infected.  Please take the ciprofloxacin as directed by your primary care doctor.  You can probably hold on the metronidazole for now.  Drink plenty of fluids.  Follow-up with your doctor.  Return to the emergency department if any worsening or concerning symptoms

## 2020-07-13 ENCOUNTER — Encounter: Payer: Self-pay | Admitting: Family Medicine

## 2020-07-13 LAB — URINE CULTURE: Culture: <10000 — AB

## 2020-07-28 ENCOUNTER — Other Ambulatory Visit: Payer: Self-pay | Admitting: Family Medicine

## 2020-07-31 ENCOUNTER — Telehealth: Payer: Self-pay | Admitting: *Deleted

## 2020-07-31 ENCOUNTER — Telehealth: Payer: Self-pay | Admitting: Pharmacist

## 2020-07-31 MED ORDER — GABAPENTIN 600 MG PO TABS: ORAL_TABLET | ORAL | 1 refills | Status: DC

## 2020-07-31 NOTE — Telephone Encounter (Signed)
-----   Message from Rosetta Posner sent at 07/31/2020 10:42 AM EDT ----- Regarding: Medication refill Good morning, Can you send a refill of the patients Gabapentin to [facility] please. Thank you.

## 2020-07-31 NOTE — Telephone Encounter (Signed)
Prescription sent to pharmacy.

## 2020-07-31 NOTE — Progress Notes (Addendum)
Chronic Care Management Pharmacy Assistant   Name: HADLEI STITT  MRN: [id number] DOB: [birth year]  Reason for Encounter: Medication Review   Medications: Outpatient Encounter Medications as of 07/31/2020  Medication Sig   amoxicillin (AMOXIL) 875 MG tablet Take 1 tablet (875 mg total) by mouth 2 (two) times daily. (Patient not taking: Reported on 07/12/2020)   Ascorbic Acid (VITAMIN C) 1000 MG tablet Take 1,000 mg by mouth daily.   aspirin EC 81 MG tablet Take 81 mg by mouth daily.   Biotin 5 MG CAPS Take 5 mg by mouth daily.   Blood Glucose Monitoring Suppl (BLOOD GLUCOSE SYSTEM PAK) KIT Please dispense based on patient and insurance preference. Use as directed to monitor FSBS 2x daily. Dx: E11.9.   cetirizine (ZYRTEC) 10 MG tablet Take 10 mg by mouth daily.   Cholecalciferol (VITAMIN D3) 125 MCG (5000 UT) CAPS Take 5,000 Units by mouth daily.   ciprofloxacin (CIPRO) 500 MG tablet Take 1 tablet (500 mg total) by mouth 2 (two) times daily.   Cyanocobalamin (B-12) 2500 MCG TABS Take 2,500 mcg by mouth daily.   DULoxetine (CYMBALTA) 60 MG capsule TAKE TWO CAPSULES BY MOUTH ONCE DAILY   EPINEPHrine 0.3 mg/0.3 mL IJ SOAJ injection Inject 0.3 mg into the muscle as needed for anaphylaxis.   Flaxseed, Linseed, (FLAXSEED OIL PO) Take 1 capsule by mouth daily.   fluticasone (FLONASE) 50 MCG/ACT nasal spray SHAKE LIQUID AND USE 2 SPRAYS IN EACH NOSTRIL DAILY (Patient taking differently: Place 2 sprays into both nostrils daily.)   furosemide (LASIX) 20 MG tablet TAKE 1 TABLET(20 MG) BY MOUTH TWICE DAILY   gabapentin (NEURONTIN) 600 MG tablet TAKE 1/2 TO 2 TABLETS(300 TO 1200 MG) BY MOUTH THREE TIMES DAILY AS NEEDED (Patient taking differently: Take 1,800 mg by mouth at bedtime.)   glucose blood (ACCU-CHEK GUIDE) test strip USE TO check blood sugar TWICE DAILY   guaiFENesin (MUCINEX) 600 MG 12 hr tablet Take 1 tablet (600 mg total) by mouth 2 (two) times daily as needed for cough or to loosen phlegm.  (Patient not taking: Reported on 07/12/2020)   Lancets (ONETOUCH DELICA PLUS [id number]) MISC USE TO TEST BLOOD SUGAR TWICE DAILY   metFORMIN (GLUCOPHAGE) 1000 MG tablet Take 1 tablet (1,000 mg total) by mouth 2 (two) times daily with a meal.   metoprolol succinate (TOPROL-XL) 50 MG 24 hr tablet Take with or immediately following a meal. (Patient taking differently: Take by mouth daily.)   metroNIDAZOLE (FLAGYL) 500 MG tablet Take 1 tablet (500 mg total) by mouth 2 (two) times daily.   neomycin-polymyxin-hydrocortisone (CORTISPORIN) OTIC solution Place 3 drops into the right ear 4 (four) times daily.   oxcarbazepine (TRILEPTAL) 600 MG tablet TAKE 1 TABLET(600 MG) BY MOUTH DAILY (Patient taking differently: Take 600 mg by mouth daily.)   pantoprazole (PROTONIX) 40 MG tablet TAKE 1 TABLET(40 MG) BY MOUTH DAILY (Patient taking differently: Take 40 mg by mouth daily.)   Polyethyl Glycol-Propyl Glycol (SYSTANE OP) Place 1 drop into both eyes daily as needed (dry eyes).   rosuvastatin (CRESTOR) 20 MG tablet Take 1 tablet (20 mg total) by mouth daily.   triamcinolone cream (KENALOG) 0.1 % Apply 1 application topically 2 (two) times daily. (Patient taking differently: Apply 1 application topically 2 (two) times daily as needed (eczema).)   TRULICITY 1.5 JA/2.5KN SOPN INJECT 1 PEN INTO THE SKIN ONCE A WEEK   valACYclovir (VALTREX) 500 MG tablet Take 1 tablet (500 mg total) by mouth 2 (  two) times daily as needed (fever blisters).   Vitamin A 2400 MCG (8000 UT) CAPS Take 2,400 mcg by mouth daily.   vitamin E 180 MG (400 UNITS) capsule Take 400 Units by mouth daily.   zinc gluconate 50 MG tablet Take 50 mg by mouth daily.   No facility-administered encounter medications on file as of 07/31/2020.   Reviewed chart for medication changes ahead of medication coordination call.  No Consults visits since last care coordination call.  Office Visits: 07/11/20 Dr. Dennard Schaumann For Abdominal pain. STARTED Ciprofloxacin 500  mg 2 times daily and metronidazole 500 mg 2 times daily.  Hospital: 07/12/20 Hayden Rasmussen, MD. For RLQ abdominal pain. Labs drawn. Urine specimen taken. CT images.No medication changes.  BP Readings from Last 3 Encounters:  07/12/20 130/79  07/11/20 (!) 143/101  07/11/20 136/82    Lab Results  Component Value Date   HGBA1C 6.5 (H) 02/28/2020     Patient obtains medications through Vials  90 Days   Last adherence delivery included: (90 DS on 04/3020) Accu-Check Guide test Strips  Duloxetine 57m  Fluticasone 550m nasal spray  Furosemide 2053mGabapentin 600m80metformin 1000mg80mtoprolol XL 50mg 17EYtouch Delica 33g l81Kets  Oxcarbazepine 600mg 58mntoprazole 40mg d7m  Rosuvastatin 20mg Tr48JEity 1.5mg/05.6DJ/4.9FWent declined meds last month: Trulicity 1.5mg/02.6VZ/8.5YI/22) Accu-Check Guide test Strips (90 day supply on 05/11/20) Duloxetine 60mg (976my supply on 05/11/20) Fluticasone 50mcg na78mspray (90 day supply on 05/11/20) Furosemide 20mg (90 38msupply on 05/11/20) Gabapentin 600mg (90 d31mupply on 05/11/20) Metformin 1000mg (90 da8mpply on 05/11/20) Metoprolol XL 50mg (90 day37mply on 05/11/20) One50/27/74lica 33g lancets (12Iday on supply 05/11/20) Oxcarbazepine 600mg  (90 day2mply on 05/11/20) Pantoprazole 40mg daily (9060m supply on 05/11/20) Rosuvastatin 20mg (90 day su6m on 05/11/20)  Patient is due for next adherence delivery on: 08/07/20.  Called patient and reviewed medications and coordinated delivery.  This delivery to include: Accu-Check Guide test Strips Duloxetine 60mg  Fluticason73mmcg nasal spray41mrosemide 20mg  Gabapentin 649m  Metformin 10042m Metoprolol XL 28m Onetouch Delica 78MVlancets  Oxcarbaz67Mne 600mg  Pantoprazole 4052maily Rosuvastati46mmg   Coordinated acut87mll for: Trulicity 1.5mg/0.5ml (08/01/20)  P0.9OB/0.9GGin8/36/62following medications: None.  Patient needs refills for: Gabapentin  600 mg requested.  Confirmed delivery date of 08/07/20, advised patient that pharmacy will contact them the morning of delivery.   Follow-Up: Pharmacist Review  Veronica Mclemore, RMA CCharlann Langet Assistant [phone]  10 [phone], coordination, and documentation.  Reviewed by: Christian Davis, PharmD Beverly Milchcist [phone]

## 2020-08-07 ENCOUNTER — Other Ambulatory Visit: Payer: Self-pay | Admitting: Family Medicine

## 2020-08-24 ENCOUNTER — Other Ambulatory Visit: Payer: Self-pay

## 2020-08-24 ENCOUNTER — Other Ambulatory Visit: Payer: Medicare Other

## 2020-08-24 ENCOUNTER — Ambulatory Visit
Admission: RE | Admit: 2020-08-24 | Discharge: 2020-08-24 | Disposition: A | Discharge status: Home / Self Care | Payer: Medicare Other | Source: Ambulatory Visit | Attending: Family Medicine | Admitting: Family Medicine

## 2020-08-24 DIAGNOSIS — Z1231 Encounter for screening mammogram for malignant neoplasm of breast: Secondary | ICD-10-CM | POA: Diagnosis not present

## 2020-08-30 ENCOUNTER — Telehealth: Payer: Self-pay | Admitting: Pharmacist

## 2020-08-30 NOTE — Progress Notes (Addendum)
Chronic Care Management Pharmacy Assistant   Name: Jacqueline [name]  MRN: [id number] DOB: 1965/11/25  Reason for Encounter: Medication Review/Medication Coordination Call.  Medications: Outpatient Encounter Medications as of 08/30/2020  Medication Sig   amoxicillin (AMOXIL) 875 MG tablet Take 1 tablet (875 mg total) by mouth 2 (two) times daily. (Patient not taking: Reported on 07/12/2020)   Ascorbic Acid (VITAMIN C) 1000 MG tablet Take 1,000 mg by mouth daily.   aspirin EC 81 MG tablet Take 81 mg by mouth daily.   Biotin 5 MG CAPS Take 5 mg by mouth daily.   Blood Glucose Monitoring Suppl (BLOOD GLUCOSE SYSTEM PAK) KIT Please dispense based on patient and insurance preference. Use as directed to monitor FSBS 2x daily. Dx: E11.9.   cetirizine (ZYRTEC) 10 MG tablet Take 10 mg by mouth daily.   Cholecalciferol (VITAMIN D3) 125 MCG (5000 UT) CAPS Take 5,000 Units by mouth daily.   ciprofloxacin (CIPRO) 500 MG tablet Take 1 tablet (500 mg total) by mouth 2 (two) times daily.   Cyanocobalamin (B-12) 2500 MCG TABS Take 2,500 mcg by mouth daily.   DULoxetine (CYMBALTA) 60 MG capsule TAKE TWO CAPSULES BY MOUTH ONCE DAILY   EPINEPHrine 0.3 mg/0.3 mL IJ SOAJ injection Inject 0.3 mg into the muscle as needed for anaphylaxis.   Flaxseed, Linseed, (FLAXSEED OIL PO) Take 1 capsule by mouth daily.   fluticasone (FLONASE) 50 MCG/ACT nasal spray SHAKE LIQUID AND USE 2 SPRAYS IN EACH NOSTRIL DAILY (Patient taking differently: Place 2 sprays into both nostrils daily.)   furosemide (LASIX) 20 MG tablet TAKE 1 TABLET(20 MG) BY MOUTH TWICE DAILY   gabapentin (NEURONTIN) 600 MG tablet take ONE-HALF TO TWO TABLETS BY MOUTH THREE TIMES DAILY AS NEEDED   gabapentin (NEURONTIN) 600 MG tablet TAKE 1/2 TO 2 TABLETS(300 TO 1200 MG) BY MOUTH THREE TIMES DAILY AS NEEDED   glucose blood (ACCU-CHEK GUIDE) test strip USE TO check blood sugar TWICE DAILY   guaiFENesin (MUCINEX) 600 MG 12 hr tablet Take 1 tablet (600 mg total)  by mouth 2 (two) times daily as needed for cough or to loosen phlegm. (Patient not taking: Reported on 07/12/2020)   Lancets (ONETOUCH DELICA PLUS [id number]) MISC USE TO TEST BLOOD SUGAR TWICE DAILY   metFORMIN (GLUCOPHAGE) 1000 MG tablet Take 1 tablet (1,000 mg total) by mouth 2 (two) times daily with a meal.   metoprolol succinate (TOPROL-XL) 50 MG 24 hr tablet TAKE ONE TABLET BY MOUTH ONCE DAILY   metroNIDAZOLE (FLAGYL) 500 MG tablet Take 1 tablet (500 mg total) by mouth 2 (two) times daily.   neomycin-polymyxin-hydrocortisone (CORTISPORIN) OTIC solution Place 3 drops into the right ear 4 (four) times daily.   oxcarbazepine (TRILEPTAL) 600 MG tablet TAKE 1 TABLET(600 MG) BY MOUTH DAILY (Patient taking differently: Take 600 mg by mouth daily.)   pantoprazole (PROTONIX) 40 MG tablet TAKE 1 TABLET(40 MG) BY MOUTH DAILY (Patient taking differently: Take 40 mg by mouth daily.)   Polyethyl Glycol-Propyl Glycol (SYSTANE OP) Place 1 drop into both eyes daily as needed (dry eyes).   rosuvastatin (CRESTOR) 20 MG tablet Take 1 tablet (20 mg total) by mouth daily.   triamcinolone cream (KENALOG) 0.1 % Apply 1 application topically 2 (two) times daily. (Patient taking differently: Apply 1 application topically 2 (two) times daily as needed (eczema).)   TRULICITY 1.5 TG/6.2IR SOPN INJECT 1 PEN INTO THE SKIN ONCE A WEEK   valACYclovir (VALTREX) 500 MG tablet Take 1 tablet (500 mg total)  by mouth 2 (two) times daily as needed (fever blisters).   Vitamin A 2400 MCG (8000 UT) CAPS Take 2,400 mcg by mouth daily.   vitamin E 180 MG (400 UNITS) capsule Take 400 Units by mouth daily.   zinc gluconate 50 MG tablet Take 50 mg by mouth daily.   No facility-administered encounter medications on file as of 08/30/2020.    Reviewed chart for medication changes ahead of medication coordination call.  No OVs, Consults, or hospital visits since last care coordination call.  No medication changes indicated.  BP Readings  from Last 3 Encounters:  07/12/20 130/79  07/11/20 (!) 143/101  07/11/20 136/82    Lab Results  Component Value Date   HGBA1C 6.5 (H) 02/28/2020     Patient obtains medications through Vials  90 Days   Last adherence delivery included:  Accu-Check Guide test Strips Duloxetine 78m  Fluticasone 566m nasal spray  Furosemide 2045mGabapentin 600m75metformin 1000mg60mtoprolol XL 50mg 40RFouch Delica 33g l54Hets  Oxcarbazepine 600mg 33mtoprazole 40mg d53m Rosuvastatin 20mg  C46minated acute fill for: Trulicity 1.5mg/0.6.0OV/7.0HE2)0/35/24nt declined meds last month: None  Patient is not due for an adherence delivery at this time.  Called patient and reviewed medications and coordinated delivery.  This delivery to include: None  Patient declined the following medications: Accu-Check Guide test Strips Duloxetine 60mg  Fl1masone 50mcg nas18mpray  Furosemide 20mg  Gaba77min 600mg  Metfo24m 1000mg  Metopr53m XL 50mg Onetouch81YHica 33g lancets  90Barbazepine 600mg  Pantopr10me 40mg daily Ros62mtatin 20mg  Trulicity31PEmg/0.5ml (3/1.6KO/4.6XFie0/72/25 not need refills at this time.  Follow-Up:Pharmacist Review   Veronica MclemoCharlann LangePharmacist Assistant [phone]  [phone] in review, coordination, and documentation.  Reviewed by: Christian DavisBeverly Milchal Pharmacist [facility] [place] [phone]

## 2020-09-15 ENCOUNTER — Ambulatory Visit
Admission: RE | Admit: 2020-09-15 | Discharge: 2020-09-15 | Disposition: A | Discharge status: Home / Self Care | Payer: Medicare Other | Source: Ambulatory Visit | Attending: Family Medicine | Admitting: Family Medicine

## 2020-09-15 ENCOUNTER — Other Ambulatory Visit: Payer: Self-pay

## 2020-09-15 DIAGNOSIS — Z780 Asymptomatic menopausal state: Secondary | ICD-10-CM

## 2020-09-15 DIAGNOSIS — M8589 Other specified disorders of bone density and structure, multiple sites: Secondary | ICD-10-CM | POA: Diagnosis not present

## 2020-09-15 DIAGNOSIS — E119 Type 2 diabetes mellitus without complications: Secondary | ICD-10-CM | POA: Diagnosis not present

## 2020-09-15 DIAGNOSIS — K7290 Hepatic failure, unspecified without coma: Secondary | ICD-10-CM | POA: Diagnosis not present

## 2020-09-18 ENCOUNTER — Encounter: Payer: Self-pay | Admitting: Family Medicine

## 2020-09-21 ENCOUNTER — Encounter: Payer: Self-pay | Admitting: Family Medicine

## 2020-09-28 ENCOUNTER — Telehealth: Payer: Self-pay | Admitting: Pharmacist

## 2020-09-28 NOTE — Progress Notes (Addendum)
Chronic Care Management Pharmacy Assistant   Name: Jacqueline [name]  MRN: [id number] DOB: 09-04-1965  Reason for Encounter: Disease State For HTN.   Conditions to be addressed/monitored: HTN, GERD, Type 2 DM.  Recent office visits:  None since 08/30/20  Recent consult visits:  None since 08/30/20  Hospital visits:  None since 08/30/20  Medications: Outpatient Encounter Medications as of 09/28/2020  Medication Sig   amoxicillin (AMOXIL) 875 MG tablet Take 1 tablet (875 mg total) by mouth 2 (two) times daily. (Patient not taking: Reported on 07/12/2020)   Ascorbic Acid (VITAMIN C) 1000 MG tablet Take 1,000 mg by mouth daily.   aspirin EC 81 MG tablet Take 81 mg by mouth daily.   Biotin 5 MG CAPS Take 5 mg by mouth daily.   Blood Glucose Monitoring Suppl (BLOOD GLUCOSE SYSTEM PAK) KIT Please dispense based on patient and insurance preference. Use as directed to monitor FSBS 2x daily. Dx: E11.9.   cetirizine (ZYRTEC) 10 MG tablet Take 10 mg by mouth daily.   Cholecalciferol (VITAMIN D3) 125 MCG (5000 UT) CAPS Take 5,000 Units by mouth daily.   ciprofloxacin (CIPRO) 500 MG tablet Take 1 tablet (500 mg total) by mouth 2 (two) times daily.   Cyanocobalamin (B-12) 2500 MCG TABS Take 2,500 mcg by mouth daily.   DULoxetine (CYMBALTA) 60 MG capsule TAKE TWO CAPSULES BY MOUTH ONCE DAILY   EPINEPHrine 0.3 mg/0.3 mL IJ SOAJ injection Inject 0.3 mg into the muscle as needed for anaphylaxis.   Flaxseed, Linseed, (FLAXSEED OIL PO) Take 1 capsule by mouth daily.   fluticasone (FLONASE) 50 MCG/ACT nasal spray SHAKE LIQUID AND USE 2 SPRAYS IN EACH NOSTRIL DAILY (Patient taking differently: Place 2 sprays into both nostrils daily.)   furosemide (LASIX) 20 MG tablet TAKE 1 TABLET(20 MG) BY MOUTH TWICE DAILY   gabapentin (NEURONTIN) 600 MG tablet take ONE-HALF TO TWO TABLETS BY MOUTH THREE TIMES DAILY AS NEEDED   gabapentin (NEURONTIN) 600 MG tablet TAKE 1/2 TO 2 TABLETS(300 TO 1200 MG) BY MOUTH THREE  TIMES DAILY AS NEEDED   glucose blood (ACCU-CHEK GUIDE) test strip USE TO check blood sugar TWICE DAILY   guaiFENesin (MUCINEX) 600 MG 12 hr tablet Take 1 tablet (600 mg total) by mouth 2 (two) times daily as needed for cough or to loosen phlegm. (Patient not taking: Reported on 07/12/2020)   Lancets (ONETOUCH DELICA PLUS [id number]) MISC USE TO TEST BLOOD SUGAR TWICE DAILY   metFORMIN (GLUCOPHAGE) 1000 MG tablet Take 1 tablet (1,000 mg total) by mouth 2 (two) times daily with a meal.   metoprolol succinate (TOPROL-XL) 50 MG 24 hr tablet TAKE ONE TABLET BY MOUTH ONCE DAILY   metroNIDAZOLE (FLAGYL) 500 MG tablet Take 1 tablet (500 mg total) by mouth 2 (two) times daily.   neomycin-polymyxin-hydrocortisone (CORTISPORIN) OTIC solution Place 3 drops into the right ear 4 (four) times daily.   oxcarbazepine (TRILEPTAL) 600 MG tablet TAKE 1 TABLET(600 MG) BY MOUTH DAILY (Patient taking differently: Take 600 mg by mouth daily.)   pantoprazole (PROTONIX) 40 MG tablet TAKE 1 TABLET(40 MG) BY MOUTH DAILY (Patient taking differently: Take 40 mg by mouth daily.)   Polyethyl Glycol-Propyl Glycol (SYSTANE OP) Place 1 drop into both eyes daily as needed (dry eyes).   rosuvastatin (CRESTOR) 20 MG tablet Take 1 tablet (20 mg total) by mouth daily.   triamcinolone cream (KENALOG) 0.1 % Apply 1 application topically 2 (two) times daily. (Patient taking differently: Apply 1 application topically 2 (  two) times daily as needed (eczema).)   TRULICITY 1.5 XB/2.8UX SOPN INJECT 1 PEN INTO THE SKIN ONCE A WEEK   valACYclovir (VALTREX) 500 MG tablet Take 1 tablet (500 mg total) by mouth 2 (two) times daily as needed (fever blisters).   Vitamin A 2400 MCG (8000 UT) CAPS Take 2,400 mcg by mouth daily.   vitamin E 180 MG (400 UNITS) capsule Take 400 Units by mouth daily.   zinc gluconate 50 MG tablet Take 50 mg by mouth daily.   No facility-administered encounter medications on file as of 09/28/2020.   Reviewed chart prior to  disease state call. Spoke with patient regarding BP  Recent Office Vitals: BP Readings from Last 3 Encounters:  07/12/20 130/79  07/11/20 (!) 143/101  07/11/20 136/82   Pulse Readings from Last 3 Encounters:  07/12/20 91  07/11/20 (!) 109  07/11/20 (!) 104    Wt Readings from Last 3 Encounters:  07/12/20 192 lb 14.4 oz (87.5 kg)  07/11/20 192 lb 14.4 oz (87.5 kg)  07/11/20 193 lb (87.5 kg)     Kidney Function Lab Results  Component Value Date/Time   CREATININE 0.73 07/12/2020 08:22 AM   CREATININE 0.71 02/28/2020 08:35 AM   CREATININE 0.71 11/16/2019 09:36 AM   GFRNONAA >60 07/12/2020 08:22 AM   GFRNONAA 99 02/28/2020 08:35 AM   GFRAA 114 02/28/2020 08:35 AM    BMP Latest Ref Rng & Units 07/12/2020 02/28/2020 11/16/2019  Glucose 70 - 99 mg/dL 122(H) 112(H) 113(H)  BUN 6 - 20 mg/dL _0 Creatinine 0.44 - 1.00 mg/dL 0.73 0.71 0.71  BUN/Creat Ratio 6 - 22 (calc) - NOT APPLICABLE NOT APPLICABLE  Sodium 324 - 145 mmol/L 137 139 141  Potassium 3.5 - 5.1 mmol/L 4.1 4.8 4.5  Chloride 98 - 111 mmol/L 94(L) 97(L) 97(L)  CO2 22 - 32 mmol/L _1 Calcium 8.9 - 10.3 mg/dL 9.7 10.3 9.7    Current antihypertensive regimen:  Metoprolol 50 mg 1 tablet daily   How often are you checking your Blood Pressure? infrequently   Current home BP readings: 125/88-5/19/22  What recent interventions/DTPs have been made by any provider to improve Blood Pressure control since last CPP Visit: None.  Any recent hospitalizations or ED visits since last visit with CPP? Patient stated no.  What diet changes have been made to improve Blood Pressure Control?  Patient her diet is the same, not much as changed.   What exercise is being done to improve your Blood Pressure Control?  Patient stated she tries to do some exercises but her legs her back so bad.   Adherence Review: Is the patient currently on ACE/ARB medication? None  Does the patient have >5 day gap between last estimated fill  dates? Per misc rtps, no.  Star Rating Drugs: Gabapentin 600 mg 90 DS 08/04/20 , Metformin 1000 mg 90 DS 07/15/20  Follow-Up:Pharmacist Review  Charlann Lange, RMA Clinical Pharmacist Assistant [phone]  10 minutes spent in review, coordination, and documentation.  Reviewed by: Beverly Milch, PharmD Clinical Pharmacist [facility] [phone]

## 2020-10-04 ENCOUNTER — Encounter: Payer: Self-pay | Admitting: Family Medicine

## 2020-10-15 ENCOUNTER — Encounter: Payer: Self-pay | Admitting: Family Medicine

## 2020-10-16 ENCOUNTER — Other Ambulatory Visit: Payer: Self-pay

## 2020-10-16 ENCOUNTER — Other Ambulatory Visit: Payer: Self-pay | Admitting: *Deleted

## 2020-10-16 ENCOUNTER — Telehealth: Payer: Self-pay | Admitting: *Deleted

## 2020-10-16 ENCOUNTER — Ambulatory Visit
Admission: RE | Admit: 2020-10-16 | Discharge: 2020-10-16 | Disposition: A | Discharge status: Home / Self Care | Payer: Medicare Other | Source: Ambulatory Visit | Attending: Family Medicine | Admitting: Family Medicine

## 2020-10-16 DIAGNOSIS — S82891A Other fracture of right lower leg, initial encounter for closed fracture: Secondary | ICD-10-CM

## 2020-10-16 DIAGNOSIS — M79671 Pain in right foot: Secondary | ICD-10-CM

## 2020-10-16 DIAGNOSIS — M25471 Effusion, right ankle: Secondary | ICD-10-CM

## 2020-10-16 DIAGNOSIS — S8261XA Displaced fracture of lateral malleolus of right fibula, initial encounter for closed fracture: Secondary | ICD-10-CM | POA: Diagnosis not present

## 2020-10-16 DIAGNOSIS — S8264XA Nondisplaced fracture of lateral malleolus of right fibula, initial encounter for closed fracture: Secondary | ICD-10-CM | POA: Diagnosis not present

## 2020-10-16 DIAGNOSIS — S82831A Other fracture of upper and lower end of right fibula, initial encounter for closed fracture: Secondary | ICD-10-CM | POA: Diagnosis not present

## 2020-10-16 DIAGNOSIS — M7989 Other specified soft tissue disorders: Secondary | ICD-10-CM | POA: Diagnosis not present

## 2020-10-16 NOTE — Telephone Encounter (Signed)
Agree with care advice.

## 2020-10-16 NOTE — Telephone Encounter (Signed)
Received message from patient in regards to fall.   States that she fell down some stairs while vacationing in [place]. Reports that R ankle is swollen and bruised. Included pictures for reference.   Call placed to patient. Advised to have X-rays obtained as soon as possible.   Received call from imaging center. Results of X-rays are as follows: Minimally displaced oblique fracture of RIGHT lateral malleolus.  No definite foot fractures identified.  Advised to go to [facility] for evaluation and treatment. Referral orders placed.

## 2020-10-19 ENCOUNTER — Encounter: Payer: Self-pay | Admitting: Family Medicine

## 2020-10-22 ENCOUNTER — Encounter: Payer: Self-pay | Admitting: Family Medicine

## 2020-10-23 ENCOUNTER — Ambulatory Visit: Payer: Medicare Other | Admitting: Family Medicine

## 2020-10-23 MED ORDER — NIRMATRELVIR/RITONAVIR (PAXLOVID)TABLET: 3.0000 | ORAL_TABLET | Freq: Two times a day (BID) | ORAL | 0 refills | Status: AC

## 2020-10-23 NOTE — Telephone Encounter (Signed)
Patient tested positive for COVID on 10/23/2020 with home test.   Sx include nasal congestion, cough.  Advised to continue symptom management with OTC medications: Tylenol/ Ibuprofen for fever/ body aches, Mucinex/ Delsym for cough/ chest congestion, Afrin/Sudafed/nasal saline for sinus pressure/ nasal congestion  GFR 99. Order for Paxlovid sent to pharmacy. Advised possible SE include nausea, diarrhea, loss of taste and hypertension.  If chest pain, shortness of breath, fever >104 that is unresponsive to antipyretics noted, advised to go to ER for evaluation.   Advised that the CDC recommends the following criteria prior to ending isolation in vaccinated persons at least 5 days since symptoms onset, then 5 days of masking AND 3 days fever free without antipyretics (Tylenol or Ibuprofen) AND improvement in respiratory symptoms.

## 2020-10-27 ENCOUNTER — Other Ambulatory Visit: Payer: Self-pay | Admitting: Family Medicine

## 2020-10-30 ENCOUNTER — Telehealth: Payer: Self-pay | Admitting: *Deleted

## 2020-10-30 ENCOUNTER — Telehealth: Payer: Self-pay | Admitting: Pharmacist

## 2020-10-30 MED ORDER — DULOXETINE HCL 60 MG PO CPEP: 120.0000 mg | ORAL_CAPSULE | Freq: Every day | ORAL | 3 refills | Status: DC

## 2020-10-30 NOTE — Telephone Encounter (Signed)
-----   Message from Rosetta Posner sent at 10/30/2020  1:21 PM EDT ----- Regarding: Medication Refill Hi can you send a refill of the patients Duloxetine 85m to [facility]. Thank you.

## 2020-10-30 NOTE — Progress Notes (Addendum)
Chronic Care Management Pharmacy Assistant   Name: Jacqueline [name]  MRN: [id number] DOB: 1966/01/16  Reason for Encounter: Medication Review/Medication Coordination Call  Medications: Outpatient Encounter Medications as of 10/30/2020  Medication Sig   amoxicillin (AMOXIL) 875 MG tablet Take 1 tablet (875 mg total) by mouth 2 (two) times daily. (Patient not taking: Reported on 07/12/2020)   Ascorbic Acid (VITAMIN C) 1000 MG tablet Take 1,000 mg by mouth daily.   aspirin EC 81 MG tablet Take 81 mg by mouth daily.   Biotin 5 MG CAPS Take 5 mg by mouth daily.   Blood Glucose Monitoring Suppl (BLOOD GLUCOSE SYSTEM PAK) KIT Please dispense based on patient and insurance preference. Use as directed to monitor FSBS 2x daily. Dx: E11.9.   cetirizine (ZYRTEC) 10 MG tablet Take 10 mg by mouth daily.   Cholecalciferol (VITAMIN D3) 125 MCG (5000 UT) CAPS Take 5,000 Units by mouth daily.   ciprofloxacin (CIPRO) 500 MG tablet Take 1 tablet (500 mg total) by mouth 2 (two) times daily.   Cyanocobalamin (B-12) 2500 MCG TABS Take 2,500 mcg by mouth daily.   DULoxetine (CYMBALTA) 60 MG capsule TAKE TWO CAPSULES BY MOUTH ONCE DAILY   EPINEPHrine 0.3 mg/0.3 mL IJ SOAJ injection Inject 0.3 mg into the muscle as needed for anaphylaxis.   Flaxseed, Linseed, (FLAXSEED OIL PO) Take 1 capsule by mouth daily.   fluticasone (FLONASE) 50 MCG/ACT nasal spray shake liquid AND USE TWO SPRAYS in each nostril DAILY   furosemide (LASIX) 20 MG tablet TAKE 1 TABLET(20 MG) BY MOUTH TWICE DAILY   gabapentin (NEURONTIN) 600 MG tablet take ONE-HALF TO TWO TABLETS BY MOUTH THREE TIMES DAILY AS NEEDED   gabapentin (NEURONTIN) 600 MG tablet TAKE 1/2 TO 2 TABLETS(300 TO 1200 MG) BY MOUTH THREE TIMES DAILY AS NEEDED   glucose blood (ACCU-CHEK GUIDE) test strip USE AS DIRECTED TWICE DAILY TO check blood sugar   guaiFENesin (MUCINEX) 600 MG 12 hr tablet Take 1 tablet (600 mg total) by mouth 2 (two) times daily as needed for cough or to  loosen phlegm. (Patient not taking: Reported on 07/12/2020)   Lancets (ONETOUCH DELICA PLUS [id number]) MISC USE TO TEST BLOOD SUGAR TWICE DAILY   metFORMIN (GLUCOPHAGE) 1000 MG tablet TAKE ONE TABLET BY MOUTH TWICE DAILY WITH FOOD   metoprolol succinate (TOPROL-XL) 50 MG 24 hr tablet TAKE ONE TABLET BY MOUTH ONCE DAILY   metroNIDAZOLE (FLAGYL) 500 MG tablet Take 1 tablet (500 mg total) by mouth 2 (two) times daily.   neomycin-polymyxin-hydrocortisone (CORTISPORIN) OTIC solution Place 3 drops into the right ear 4 (four) times daily.   oxcarbazepine (TRILEPTAL) 600 MG tablet TAKE 1 TABLET(600 MG) BY MOUTH DAILY (Patient taking differently: Take 600 mg by mouth daily.)   pantoprazole (PROTONIX) 40 MG tablet TAKE 1 TABLET(40 MG) BY MOUTH DAILY (Patient taking differently: Take 40 mg by mouth daily.)   Polyethyl Glycol-Propyl Glycol (SYSTANE OP) Place 1 drop into both eyes daily as needed (dry eyes).   rosuvastatin (CRESTOR) 20 MG tablet Take 1 tablet (20 mg total) by mouth daily.   triamcinolone cream (KENALOG) 0.1 % Apply 1 application topically 2 (two) times daily. (Patient taking differently: Apply 1 application topically 2 (two) times daily as needed (eczema).)   TRULICITY 1.5 HQ/7.5FF SOPN INJECT 1 PEN INTO THE SKIN ONCE A WEEK   valACYclovir (VALTREX) 500 MG tablet Take 1 tablet (500 mg total) by mouth 2 (two) times daily as needed (fever blisters).   Vitamin A  2400 MCG (8000 UT) CAPS Take 2,400 mcg by mouth daily.   vitamin E 180 MG (400 UNITS) capsule Take 400 Units by mouth daily.   zinc gluconate 50 MG tablet Take 50 mg by mouth daily.   No facility-administered encounter medications on file as of 10/30/2020.    Reviewed chart for medication changes ahead of medication coordination call.  No OVs, Consults, or hospital visits since last care coordination call.  No medication changes indicated.  BP Readings from Last 3 Encounters:  07/12/20 130/79  07/11/20 (!) 143/101  07/11/20 136/82     Lab Results  Component Value Date   HGBA1C 6.5 (H) 02/28/2020     Patient obtains medications through Vials  90 Days   Last adherence delivery included: (90 DS 08/07/20) Accu-Check Guide test Strips Duloxetine 51m  Fluticasone 543m nasal spray  Furosemide 209mGabapentin 600m25metformin 1000mg23mtoprolol XL 50mg 07MAouch Delica 33g l26Jets  Oxcarbazepine 600mg 11mtoprazole 40mg d16m Rosuvastatin 20mg   53ment declined meds last month: Accu-Check Guide test Strips Duloxetine 60mg  Fl68masone 50mcg nas19mpray  Furosemide 20mg  Gaba52min 600mg  Metfo34m 1000mg  Metopr53m XL 50mg Onetouch33LKica 33g lancets  56Yarbazepine 600mg  Pantopr52me 40mg daily Ros22mtatin 20mg  Trulicity56LSmg/0.5ml (3/9.3TD/4.2AJie6/81/15ue for next adherence delivery on: 11/06/20.  Called patient and reviewed medications and coordinated delivery.  This delivery to include: Accu-Check Guide test Strips Duloxetine 60mg  Fluticaso3m0mcg nasal spra31murosemide 20mg  Gabapentin 67mg  Metformin 1059m  Metoprolol XL43mg Onetouch Delica72IO lancets  Oxcarba03Tine 600mg  Pantoprazole 411mdaily Rosuvastat41m0mg  Onetouch Del Mis81ms 33 g Needles  Coordinated acute fill for: Trulicity Inj 1.5/0.5ml on 10/31/20  P5.9/7.4BUclined the following medications: None  Patient needs refills for: Duloxetine 60mg, requested.   Conf39md delivery date of 11/06/20, advised patient that pharmacy will contact them the morning of delivery.  Follow-Up:Pharmacist Review  Veronica Mclemore, RMA CCharlann Langet Assistant [phone]  10 [phone], coordination, and documentation.  Reviewed by: Christian Davis, PharmD Beverly Milchcist [phone]

## 2020-11-02 DIAGNOSIS — S8261XA Displaced fracture of lateral malleolus of right fibula, initial encounter for closed fracture: Secondary | ICD-10-CM | POA: Diagnosis not present

## 2020-11-30 DIAGNOSIS — S8261XA Displaced fracture of lateral malleolus of right fibula, initial encounter for closed fracture: Secondary | ICD-10-CM | POA: Diagnosis not present

## 2020-12-15 ENCOUNTER — Encounter: Payer: Self-pay | Admitting: Family Medicine

## 2020-12-15 ENCOUNTER — Other Ambulatory Visit: Payer: Self-pay

## 2020-12-15 ENCOUNTER — Ambulatory Visit (INDEPENDENT_AMBULATORY_CARE_PROVIDER_SITE_OTHER): Payer: Medicare Other | Admitting: Family Medicine

## 2020-12-15 VITALS — BP 134/80 | HR 76 | Temp 98.2°F | Resp 16 | Ht 62.0 in | Wt 192.0 lb

## 2020-12-15 DIAGNOSIS — S39012A Strain of muscle, fascia and tendon of lower back, initial encounter: Secondary | ICD-10-CM

## 2020-12-15 DIAGNOSIS — R300 Dysuria: Secondary | ICD-10-CM

## 2020-12-15 LAB — URINALYSIS, ROUTINE W REFLEX MICROSCOPIC
Bacteria, UA: NONE SEEN /HPF
Bilirubin Urine: NEGATIVE
Glucose, UA: NEGATIVE
Hyaline Cast: NONE SEEN /LPF
Ketones, ur: NEGATIVE
Leukocytes,Ua: NEGATIVE
Nitrite: NEGATIVE
Protein, ur: NEGATIVE
Specific Gravity, Urine: 1.015 (ref 1.001–1.035)
WBC, UA: NONE SEEN /HPF (ref 0–5)
pH: 7 (ref 5.0–8.0)

## 2020-12-15 LAB — MICROSCOPIC MESSAGE

## 2020-12-15 MED ORDER — TIZANIDINE HCL 4 MG PO TABS: 4.0000 mg | ORAL_TABLET | Freq: Four times a day (QID) | ORAL | 0 refills | Status: DC | PRN

## 2020-12-15 NOTE — Progress Notes (Signed)
Subjective:    Patient ID: Jacqueline [name], female    DOB: [birth year], 55 y.o.   MRN: [id number]   Patient reports a 5-day history of left lower back pain.  The pain is located in her left flank roughly around the level of L5.  She has a palpable tender muscle spasm in that area that reproduces the pain.  She denies any falls or injuries.  She states that the first day she did have some mild dysuria however urinalysis today shows no nitrates and no leukocyte esterase.  There is a trace amount of blood.  However she denies any radiation of the pain.  She denies any numbness or tingling radiating down her legs.  She denies any weakness in her legs however I am able to reproduce the pain with palpation in the lumbar paraspinal muscles and there are palpable spasms in that area Past Medical History:  Diagnosis Date   Abnormal transaminases    Allergy    [employer]reesia 03/01/2020   Anemia    Anxiety disorder    Arthritis    Asthma    Cancer ([place])    [employer]reesia 03/01/2020   Cellulitis    Chronic headaches    Diabetes mellitus without complication ([place])    [employer]reesia 03/01/2020   Diverticulitis    DM (diabetes mellitus) ([place])    Erosive esophagitis    Fatty liver    GERD (gastroesophageal reflux disease)    Hyperlipidemia    [employer]ia 03/01/2020   IBS (irritable bowel syndrome)    Kidney stones    Neuromuscular disorder ([place])    [employer]reesia 03/01/2020   Obesity    Pneumonia    Pseudotumor cerebri    Sinus tachycardia    Past Surgical History:  Procedure Laterality Date   ABDOMINAL HYSTERECTOMY N/A    [employer]ia 03/01/2020   ANKLE SURGERY     COLONOSCOPY  06/19/2009   COLONOSCOPY WITH PROPOFOL N/A 03/29/2020   Procedure: COLONOSCOPY WITH PROPOFOL;  Surgeon: Harvel Quale, MD;  Location: [facility];  Service: Gastroenterology;  Laterality: N/A;  9:00   EGD  06/09/2009   FOOT SURGERY     NASAL SEPTUM SURGERY     ovarian cysts     x3   TONSILLECTOMY     TUBAL LIGATION      VAGINAL HYSTERECTOMY     Current Outpatient Medications on File Prior to Visit  Medication Sig Dispense Refill   amoxicillin (AMOXIL) 875 MG tablet Take 1 tablet (875 mg total) by mouth 2 (two) times daily. (Patient not taking: Reported on 07/12/2020) 20 tablet 0   Ascorbic Acid (VITAMIN C) 1000 MG tablet Take 1,000 mg by mouth daily.     aspirin EC 81 MG tablet Take 81 mg by mouth daily.     Biotin 5 MG CAPS Take 5 mg by mouth daily.     Blood Glucose Monitoring Suppl (BLOOD GLUCOSE SYSTEM PAK) KIT Please dispense based on patient and insurance preference. Use as directed to monitor FSBS 2x daily. Dx: E11.9. 1 kit 1   cetirizine (ZYRTEC) 10 MG tablet Take 10 mg by mouth daily.     Cholecalciferol (VITAMIN D3) 125 MCG (5000 UT) CAPS Take 5,000 Units by mouth daily.     ciprofloxacin (CIPRO) 500 MG tablet Take 1 tablet (500 mg total) by mouth 2 (two) times daily. 20 tablet 0   Cyanocobalamin (B-12) 2500 MCG TABS Take 2,500 mcg by mouth daily.     DULoxetine (CYMBALTA) 60 MG capsule  Take 2 capsules (120 mg total) by mouth daily. 60 capsule 3   EPINEPHrine 0.3 mg/0.3 mL IJ SOAJ injection Inject 0.3 mg into the muscle as needed for anaphylaxis.     Flaxseed, Linseed, (FLAXSEED OIL PO) Take 1 capsule by mouth daily.     fluticasone (FLONASE) 50 MCG/ACT nasal spray shake liquid AND USE TWO SPRAYS in each nostril DAILY 16 g 6   furosemide (LASIX) 20 MG tablet TAKE 1 TABLET(20 MG) BY MOUTH TWICE DAILY 180 tablet 3   gabapentin (NEURONTIN) 600 MG tablet take ONE-HALF TO TWO TABLETS BY MOUTH THREE TIMES DAILY AS NEEDED 540 tablet 3   gabapentin (NEURONTIN) 600 MG tablet TAKE 1/2 TO 2 TABLETS(300 TO 1200 MG) BY MOUTH THREE TIMES DAILY AS NEEDED 180 tablet 1   glucose blood (ACCU-CHEK GUIDE) test strip USE AS DIRECTED TWICE DAILY TO check blood sugar 100 strip 6   guaiFENesin (MUCINEX) 600 MG 12 hr tablet Take 1 tablet (600 mg total) by mouth 2 (two) times daily as needed for cough or to loosen phlegm.  (Patient not taking: Reported on 07/12/2020) 30 tablet 0   Lancets (ONETOUCH DELICA PLUS [id number]) MISC USE TO TEST BLOOD SUGAR TWICE DAILY 200 each 3   metFORMIN (GLUCOPHAGE) 1000 MG tablet TAKE ONE TABLET BY MOUTH TWICE DAILY WITH FOOD 180 tablet 2   metoprolol succinate (TOPROL-XL) 50 MG 24 hr tablet TAKE ONE TABLET BY MOUTH ONCE DAILY 90 tablet 1   metroNIDAZOLE (FLAGYL) 500 MG tablet Take 1 tablet (500 mg total) by mouth 2 (two) times daily. 20 tablet 0   neomycin-polymyxin-hydrocortisone (CORTISPORIN) OTIC solution Place 3 drops into the right ear 4 (four) times daily. 10 mL 0   oxcarbazepine (TRILEPTAL) 600 MG tablet TAKE 1 TABLET(600 MG) BY MOUTH DAILY (Patient taking differently: Take 600 mg by mouth daily.) 90 tablet 3   pantoprazole (PROTONIX) 40 MG tablet TAKE 1 TABLET(40 MG) BY MOUTH DAILY (Patient taking differently: Take 40 mg by mouth daily.) 90 tablet 3   Polyethyl Glycol-Propyl Glycol (SYSTANE OP) Place 1 drop into both eyes daily as needed (dry eyes).     rosuvastatin (CRESTOR) 20 MG tablet Take 1 tablet (20 mg total) by mouth daily. 90 tablet 3   triamcinolone cream (KENALOG) 0.1 % Apply 1 application topically 2 (two) times daily. (Patient taking differently: Apply 1 application topically 2 (two) times daily as needed (eczema).) 30 g 0   TRULICITY 1.5 VE/7.2CN SOPN INJECT 1 PEN INTO THE SKIN ONCE A WEEK 2 mL 6   valACYclovir (VALTREX) 500 MG tablet Take 1 tablet (500 mg total) by mouth 2 (two) times daily as needed (fever blisters). 6 tablet 3   Vitamin A 2400 MCG (8000 UT) CAPS Take 2,400 mcg by mouth daily.     vitamin E 180 MG (400 UNITS) capsule Take 400 Units by mouth daily.     zinc gluconate 50 MG tablet Take 50 mg by mouth daily.     No current facility-administered medications on file prior to visit.   Allergies  Allergen Reactions   Bee Venom Anaphylaxis   Aspirin Hives, Itching and Swelling   Codeine     Increases liver enzymes    Eql Antibacterial Hand Soap  [Benzalkonium Chloride] Hives   Nsaids     Hallucinations    Reglan [Metoclopramide] Hives   Social History   Socioeconomic History   Marital status: Married    Spouse name: Not on file   Number of children: 3  Years of education: Not on file   Highest education level: Not on file  Occupational History   Occupation: Disabled  Tobacco Use   Smoking status: Former   Smokeless tobacco: Never  Scientific laboratory technician Use: Never used  Substance and Sexual Activity   Alcohol use: No   Drug use: No   Sexual activity: Not on file  Other Topics Concern   Not on file  Social History Narrative   Not on file   Social Determinants of Health   Financial Resource Strain: Low Risk    Difficulty of Paying Living Expenses: Not very hard  Food Insecurity: Not on file  Transportation Needs: Not on file  Physical Activity: Not on file  Stress: Not on file  Social Connections: Not on file  Intimate Partner Violence: Not on file     Review of Systems  All other systems reviewed and are negative.     Objective:   Physical Exam Vitals reviewed.  Constitutional:      General: She is not in acute distress.    Appearance: She is obese. She is not ill-appearing, toxic-appearing or diaphoretic.  Cardiovascular:     Rate and Rhythm: Normal rate and regular rhythm.     Heart sounds: No murmur heard. Pulmonary:     Effort: Pulmonary effort is normal. No respiratory distress.     Breath sounds: Normal breath sounds. No stridor. No wheezing, rhonchi or rales.  Chest:     Chest wall: No tenderness.  Abdominal:     General: Bowel sounds are decreased. There is no distension.     Palpations: Abdomen is soft.     Tenderness: There is no abdominal tenderness. There is no guarding.    Musculoskeletal:     Lumbar back: Spasms and tenderness present. Normal range of motion.       Back:  Neurological:     Mental Status: She is alert.          Assessment & Plan:  Dysuria - Plan:  Urinalysis, Routine w reflex microscopic  Strain of lumbar region, initial encounter I believe this is likely a strain in the muscles in her lower back.  Begin Zanaflex 4 mg every 6 hours as needed for muscle spasms.  Recommended heat.  Recommended range of motion exercises to try to improve flexibility and keep the muscles loose and limber.  No evidence of sciatica.  No evidence of urinary tract infection.

## 2020-12-28 DIAGNOSIS — S8261XA Displaced fracture of lateral malleolus of right fibula, initial encounter for closed fracture: Secondary | ICD-10-CM | POA: Diagnosis not present

## 2021-01-10 DIAGNOSIS — S8261XA Displaced fracture of lateral malleolus of right fibula, initial encounter for closed fracture: Secondary | ICD-10-CM | POA: Diagnosis not present

## 2021-01-19 ENCOUNTER — Encounter: Payer: Self-pay | Admitting: Family Medicine

## 2021-01-21 ENCOUNTER — Other Ambulatory Visit: Payer: Self-pay | Admitting: Family Medicine

## 2021-01-22 ENCOUNTER — Telehealth: Payer: Self-pay | Admitting: Pharmacist

## 2021-01-22 NOTE — Progress Notes (Addendum)
Chronic Care Management Pharmacy Assistant   Name: Jacqueline [name]  MRN: [id number] DOB: 1965-09-18  Reason for Encounter: Medication Review/Medcation Coordination Call  Medications: Outpatient Encounter Medications as of 01/22/2021  Medication Sig   aspirin EC 81 MG tablet Take 81 mg by mouth daily.   Blood Glucose Monitoring Suppl (BLOOD GLUCOSE SYSTEM PAK) KIT Please dispense based on patient and insurance preference. Use as directed to monitor FSBS 2x daily. Dx: E11.9.   cetirizine (ZYRTEC) 10 MG tablet Take 10 mg by mouth daily.   DULoxetine (CYMBALTA) 60 MG capsule Take 2 capsules (120 mg total) by mouth daily.   EPINEPHrine 0.3 mg/0.3 mL IJ SOAJ injection Inject 0.3 mg into the muscle as needed for anaphylaxis.   Flaxseed, Linseed, (FLAXSEED OIL PO) Take 1 capsule by mouth daily.   fluticasone (FLONASE) 50 MCG/ACT nasal spray shake liquid AND USE TWO SPRAYS in each nostril DAILY   furosemide (LASIX) 20 MG tablet TAKE 1 TABLET(20 MG) BY MOUTH TWICE DAILY   gabapentin (NEURONTIN) 600 MG tablet take ONE-HALF TO TWO TABLETS BY MOUTH THREE TIMES DAILY AS NEEDED   glucose blood (ACCU-CHEK GUIDE) test strip USE AS DIRECTED TWICE DAILY TO check blood sugar   guaiFENesin (MUCINEX) 600 MG 12 hr tablet Take 1 tablet (600 mg total) by mouth 2 (two) times daily as needed for cough or to loosen phlegm.   Lancets (ONETOUCH DELICA PLUS [id number]) MISC USE TO TEST BLOOD SUGAR TWICE DAILY   metFORMIN (GLUCOPHAGE) 1000 MG tablet TAKE ONE TABLET BY MOUTH TWICE DAILY WITH FOOD   metoprolol succinate (TOPROL-XL) 50 MG 24 hr tablet TAKE ONE TABLET BY MOUTH ONCE DAILY   oxcarbazepine (TRILEPTAL) 600 MG tablet TAKE 1 TABLET(600 MG) BY MOUTH DAILY (Patient taking differently: Take 600 mg by mouth daily.)   pantoprazole (PROTONIX) 40 MG tablet TAKE 1 TABLET(40 MG) BY MOUTH DAILY (Patient taking differently: Take 40 mg by mouth daily.)   Polyethyl Glycol-Propyl Glycol (SYSTANE OP) Place 1 drop into both eyes  daily as needed (dry eyes).   rosuvastatin (CRESTOR) 20 MG tablet Take 1 tablet (20 mg total) by mouth daily.   tiZANidine (ZANAFLEX) 4 MG tablet Take 1 tablet (4 mg total) by mouth every 6 (six) hours as needed for muscle spasms.   triamcinolone cream (KENALOG) 0.1 % Apply 1 application topically 2 (two) times daily. (Patient taking differently: Apply 1 application topically 2 (two) times daily as needed (eczema).)   TRULICITY 1.5 FX/8.3AN SOPN INJECT 1 PEN INTO THE SKIN ONCE A WEEK   valACYclovir (VALTREX) 500 MG tablet Take 1 tablet (500 mg total) by mouth 2 (two) times daily as needed (fever blisters).   No facility-administered encounter medications on file as of 01/22/2021.    Reviewed chart for medication changes ahead of medication coordination call.  No Consults, or hospital visits since last care coordination call.    Office visits: 12/15/20 Dr.Pickard STARTED Tizanidine 4 mg every 6 hours PRN. STOPPED amoxicillin, Ascorbic Acid, Biotin, Cholecalciferol. Cipro. Metronidazole. Neomycin, Vitamin A and E, and Zinc.   BP Readings from Last 3 Encounters:  12/15/20 134/80  07/12/20 130/79  07/11/20 (!) 143/101    Lab Results  Component Value Date   HGBA1C 6.5 (H) 02/28/2020     Patient obtains medications through Vials  90 Days   Last adherence delivery included:  Accu-Check Guide test Strips Duloxetine 76m  Fluticasone 539m nasal spray  Furosemide 2037mGabapentin 600m38metformin 1000mg47mtoprolol XL 50mg 42mouch  Delica 48O lancets  Oxcarbazepine 656m  Pantoprazole 430mdaily Rosuvastatin 2055mOnetouch Del Mis Plus 33 g Needles  Patient declined (meds) last month: Accu-Check Guide test Strips Duloxetine 57m62murosemide 20mg90mbapentin 600mg 57mformin 1000mg  100mprolol XL 50mg On70BEch Delica 33g lan67Js  Oxcarbazepine 600mg  P16mprazole 40mg dai18mosuvastatin 20mg  Tru44BEty 1.5mg/0.50.1EO/7.1QR) 9/75/88t is due for next adherence delivery on:  01/31/21.  Called patient and reviewed medications and coordinated delivery.  This delivery to include: Accu-Check Guide test Strips Duloxetine 57mg  Fur29mide 20mg  Gaba24min 600mg  Metfo62m 1000mg  Metopr14m XL 50mg Onetouch32PQica 33g lancets  98Yarbazepine 600mg  Pantopr64me 40mg daily Ros67mtatin 20mg  Triamcino38m Cream 0.1% Apply 1 app6.4%tion topically 2 (two) times daily. Tizanidine 4 mg  Coordinated acute fill for:Trulicity 1.BRA:XENMMHWKG18.8PJ/0.3PRen9/45/85ned the following medications: Fluticasone 50mcg nasal spra50mPatient needs refills for:Metoprolol 50FYT:WKMQKMMNOTe 20 mg, Oxcarbazepine 600 mg, Rosuvastatin 20 mg. Duloxetine 60 mg, and Triamcinolone Cream 0.1%.   Confirmed delivery date of 01/31/21, advised patient that pharmacy will contact them the morning of delivery.  Follow-Up:Pharmacist Review  Veronica MclemoreCharlann Langearmacist Assistant [phone]  [phone] in review, coordination, and documentation.  Reviewed by: Christian Davis, Beverly Milch Pharmacist [phone]

## 2021-01-23 ENCOUNTER — Telehealth: Payer: Self-pay | Admitting: *Deleted

## 2021-01-23 MED ORDER — DULOXETINE HCL 60 MG PO CPEP: 120.0000 mg | ORAL_CAPSULE | Freq: Every day | ORAL | 3 refills | Status: DC

## 2021-01-23 MED ORDER — TRIAMCINOLONE ACETONIDE 0.1 % EX CREA: 1.0000 "application " | TOPICAL_CREAM | Freq: Two times a day (BID) | CUTANEOUS | 3 refills | Status: DC | PRN

## 2021-01-23 NOTE — Telephone Encounter (Signed)
-----   Message from [place] sent at 01/22/2021  3:42 PM EDT ----- Regarding: Refill Hi can you send a 90 DS refill to [facility] for Metoprolol 50 mg, Furosemide 20 mg, Oxcarbazepine 600 mg, Rosuvastatin 20 mg. Duloxetine 60 mg, and Triamcinolone Cream 0.1%. Thanks.

## 2021-01-31 ENCOUNTER — Other Ambulatory Visit: Payer: Self-pay | Admitting: *Deleted

## 2021-01-31 NOTE — Telephone Encounter (Signed)
-----   Message from Rosetta Posner sent at 01/29/2021  2:39 PM EDT ----- Regarding: Medication Refill Hi can you send a refill for Tizanidine 4 mg to [facility], thanks.

## 2021-01-31 NOTE — Telephone Encounter (Signed)
Ok to refill 

## 2021-02-01 MED ORDER — TIZANIDINE HCL 4 MG PO TABS: 4.0000 mg | ORAL_TABLET | Freq: Four times a day (QID) | ORAL | 0 refills | Status: DC | PRN

## 2021-02-05 DIAGNOSIS — M25532 Pain in left wrist: Secondary | ICD-10-CM | POA: Diagnosis not present

## 2021-02-05 DIAGNOSIS — S8261XA Displaced fracture of lateral malleolus of right fibula, initial encounter for closed fracture: Secondary | ICD-10-CM | POA: Diagnosis not present

## 2021-02-08 ENCOUNTER — Other Ambulatory Visit: Payer: Self-pay

## 2021-02-08 ENCOUNTER — Ambulatory Visit (INDEPENDENT_AMBULATORY_CARE_PROVIDER_SITE_OTHER): Payer: Medicare Other

## 2021-02-08 VITALS — Ht 62.0 in | Wt 188.0 lb

## 2021-02-08 DIAGNOSIS — Z0000 Encounter for general adult medical examination without abnormal findings: Secondary | ICD-10-CM | POA: Diagnosis not present

## 2021-02-08 NOTE — Progress Notes (Signed)
Subjective:   Jacqueline [name] is a 55 y.o. female who presents for an Initial Medicare Annual Wellness Visit. Virtual Visit via Telephone Note  I connected with  Jacqueline [name] on 02/08/21 at  2:00 PM EDT by telephone and verified that I am speaking with the correct person using two identifiers.  Location: Patient: HOME  Provider: [facility] Persons participating in the virtual visit: patient/Nurse Health Advisor   I discussed the limitations, risks, security and privacy concerns of performing an evaluation and management service by telephone and the availability of in person appointments. The patient expressed understanding and agreed to proceed.  Interactive audio and video telecommunications were attempted between this nurse and patient, however failed, due to patient having technical difficulties OR patient did not have access to video capability.  We continued and completed visit with audio only.  Some vital signs may be absent or patient reported.   Jacqueline Driver, LPN  Review of Systems     Cardiac Risk Factors include: diabetes mellitus;hypertension;dyslipidemia;obesity (BMI >30kg/m2);sedentary lifestyle     Objective:    Today's Vitals   02/08/21 1354  Weight: 188 lb (85.3 kg)  Height: 5' 2" (1.575 m)   Body mass index is 34.39 kg/m.  Advanced Directives 02/08/2021 07/12/2020 07/11/2020 03/29/2020  Does Patient Have a Medical Advance Directive? No No No No  Would patient like information on creating a medical advance directive? No - Patient declined - - No - Patient declined    Current Medications (verified) Outpatient Encounter Medications as of 02/08/2021  Medication Sig   aspirin EC 81 MG tablet Take 81 mg by mouth daily.   Blood Glucose Monitoring Suppl (BLOOD GLUCOSE SYSTEM PAK) KIT Please dispense based on patient and insurance preference. Use as directed to monitor FSBS 2x daily. Dx: E11.9.   cetirizine (ZYRTEC) 10 MG tablet Take 10 mg by mouth daily.    DULoxetine (CYMBALTA) 60 MG capsule Take 2 capsules (120 mg total) by mouth daily.   EPINEPHrine 0.3 mg/0.3 mL IJ SOAJ injection Inject 0.3 mg into the muscle as needed for anaphylaxis.   Flaxseed, Linseed, (FLAXSEED OIL PO) Take 1 capsule by mouth daily.   fluticasone (FLONASE) 50 MCG/ACT nasal spray shake liquid AND USE TWO SPRAYS in each nostril DAILY   furosemide (LASIX) 20 MG tablet TAKE ONE TABLET BY MOUTH TWICE DAILY   gabapentin (NEURONTIN) 600 MG tablet take ONE-HALF TO TWO TABLETS BY MOUTH THREE TIMES DAILY AS NEEDED   glucose blood (ACCU-CHEK GUIDE) test strip USE AS DIRECTED TWICE DAILY TO check blood sugar   guaiFENesin (MUCINEX) 600 MG 12 hr tablet Take 1 tablet (600 mg total) by mouth 2 (two) times daily as needed for cough or to loosen phlegm.   Lancets (ONETOUCH DELICA PLUS [id number]) MISC USE TO TEST BLOOD SUGAR TWICE DAILY   metFORMIN (GLUCOPHAGE) 1000 MG tablet TAKE ONE TABLET BY MOUTH TWICE DAILY WITH FOOD   metoprolol succinate (TOPROL-XL) 50 MG 24 hr tablet TAKE ONE TABLET BY MOUTH ONCE DAILY   oxcarbazepine (TRILEPTAL) 600 MG tablet TAKE ONE TABLET BY MOUTH ONCE DAILY   pantoprazole (PROTONIX) 40 MG tablet TAKE 1 TABLET(40 MG) BY MOUTH DAILY (Patient taking differently: Take 40 mg by mouth daily.)   Polyethyl Glycol-Propyl Glycol (SYSTANE OP) Place 1 drop into both eyes daily as needed (dry eyes).   rosuvastatin (CRESTOR) 20 MG tablet TAKE ONE TABLET BY MOUTH ONCE DAILY   tiZANidine (ZANAFLEX) 4 MG tablet Take 1 tablet (4 mg total)  by mouth every 6 (six) hours as needed for muscle spasms.   triamcinolone cream (KENALOG) 0.1 % Apply 1 application topically 2 (two) times daily as needed (eczema).   TRULICITY 1.5 DQ/2.2WL SOPN INJECT 1 PEN INTO THE SKIN ONCE A WEEK   valACYclovir (VALTREX) 500 MG tablet Take 1 tablet (500 mg total) by mouth 2 (two) times daily as needed (fever blisters).   No facility-administered encounter medications on file as of 02/08/2021.    Allergies  (verified) Bee venom, Aspirin, Codeine, Eql antibacterial hand soap [benzalkonium chloride], Nsaids, and Reglan [metoclopramide]   History: Past Medical History:  Diagnosis Date   Abnormal transaminases    Allergy    Phreesia 03/01/2020   Anemia    Anxiety disorder    Arthritis    Asthma    Cancer ([place])    [employer]reesia 03/01/2020   Cellulitis    Chronic headaches    Diabetes mellitus without complication ([place])    [employer]reesia 03/01/2020   Diverticulitis    DM (diabetes mellitus) ([facility])    Erosive esophagitis    Fatty liver    GERD (gastroesophageal reflux disease)    Hyperlipidemia    Phreesia 03/01/2020   IBS (irritable bowel syndrome)    Kidney stones    Neuromuscular disorder ([place])    Phreesia 03/01/2020   Obesity    Pneumonia    Pseudotumor cerebri    Sinus tachycardia    Past Surgical History:  Procedure Laterality Date   ABDOMINAL HYSTERECTOMY N/A    Phreesia 03/01/2020   ANKLE SURGERY     COLONOSCOPY  06/19/2009   COLONOSCOPY WITH PROPOFOL N/A 03/29/2020   Procedure: COLONOSCOPY WITH PROPOFOL;  Surgeon: Harvel Quale, MD;  [facility];  Service: Gastroenterology;  Laterality: N/A;  9:00   EGD  06/09/2009   FOOT SURGERY     NASAL SEPTUM SURGERY     ovarian cysts     x3   TONSILLECTOMY     TUBAL LIGATION     VAGINAL HYSTERECTOMY     Family History  Problem Relation Age of Onset   Colitis Other    Crohn's disease Other    Breast cancer Other    Ovarian cancer Other    Celiac disease Other    Clotting disorder Other    Cystic fibrosis Other        pat. cousin   Diabetes Mother    Heart disease Other    Irritable bowel syndrome Other    Kidney failure Other 68       mat. nephew   Diabetes Maternal Aunt    Diabetes Son    Diabetes Other        mat. nephew   Colon cancer Neg Hx    Social History   Socioeconomic History   Marital status: Married    Spouse name: Charles   Number of children: 3   Years of education: Not on file    Highest education level: Not on file  Occupational History   Occupation: Disabled  Tobacco Use   Smoking status: Former   Smokeless tobacco: Never  Scientific laboratory technician Use: Never used  Substance and Sexual Activity   Alcohol use: No   Drug use: No   Sexual activity: Yes    Birth control/protection: Surgical  Other Topics Concern   Not on file  Social History Narrative   Lives with husband and children.    Social Determinants of Health   Financial Resource Strain:  Low Risk    Difficulty of Paying Living Expenses: Not hard at all  Food Insecurity: No Food Insecurity   Worried About Running Out of Food in the Last Year: Never true   Ran Out of Food in the Last Year: Never true  Transportation Needs: No Transportation Needs   Lack of Transportation (Medical): No   Lack of Transportation (Non-Medical): No  Physical Activity: Sufficiently Active   Days of Exercise per Week: 5 days   Minutes of Exercise per Session: 60 min  Stress: No Stress Concern Present   Feeling of Stress : Not at all  Social Connections: Moderately Integrated   Frequency of Communication with Friends and Family: More than three times a week   Frequency of Social Gatherings with Friends and Family: More than three times a week   Attends Religious Services: Never   Marine scientist or Organizations: Yes   Attends Archivist Meetings: Never   Marital Status: Married    Tobacco Counseling Counseling given: Not Answered   Clinical Intake:  Pre-visit preparation completed: Yes  Pain : No/denies pain     BMI - recorded: 34.39 Nutritional Status: BMI > 30  Obese Nutritional Risks: None Diabetes: Yes  How often do you need to have someone help you when you read instructions, pamphlets, or other written materials from your doctor or pharmacy?: 1 - Never  Diabetic?Nutrition Risk Assessment:  Has the patient had any N/V/D within the last 2 months?  No  Does the patient have any  non-healing wounds?  No  Has the patient had any unintentional weight loss or weight gain?  No   Diabetes:  Is the patient diabetic?  Yes  If diabetic, was a CBG obtained today?  No  Did the patient bring in their glucometer from home?  No  Phone visit. How often do you monitor your CBG's? Daily.   Financial Strains and Diabetes Management:  Are you having any financial strains with the device, your supplies or your medication? No .  Does the patient want to be seen by Chronic Care Management for management of their diabetes?  No  Would the patient like to be referred to a Nutritionist or for Diabetic Management?  No   Diabetic Exams:  Diabetic Eye Exam: Completed 12/2019. Overdue for diabetic eye exam. Pt has been advised about the importance in completing this exam.   Diabetic Foot Exam: Completed 03/02/2020. Pt has been advised about the importance in completing this exam.    Interpreter Needed?: No  Information entered by :: MJ Camari Quintanilla, LPN   Activities of Daily Living In your present state of health, do you have any difficulty performing the following activities: 02/08/2021  Hearing? N  Vision? N  Difficulty concentrating or making decisions? N  Walking or climbing stairs? N  Dressing or bathing? N  Doing errands, shopping? N  Preparing Food and eating ? N  Using the Toilet? N  In the past six months, have you accidently leaked urine? N  Do you have problems with loss of bowel control? N  Managing your Medications? N  Managing your Finances? N  Housekeeping or managing your Housekeeping? N  Some recent data might be hidden    Patient Care Team: Susy Frizzle, MD as PCP - General (Family Medicine) Lauraine Rinne, MD (Neurology) Edythe Clarity, [facility] as Pharmacist (Pharmacist)  Indicate any recent Medical Services you may have received from other than [facility] providers in the past year (date  may be approximate).     Assessment:   This is a routine wellness  examination for Jacqueline [name].  Hearing/Vision screen Hearing Screening - Comments:: No hearing issues but does have ringing in ears. Vision Screening - Comments:: Glasses. Eye exam 12/2019, Dr. Nancy Fetter  Dietary issues and exercise activities discussed: Current Exercise Habits: Home exercise routine, Type of exercise: walking, Time (Minutes): 60, Frequency (Times/Week): 5, Weekly Exercise (Minutes/Week): 300, Intensity: Mild, Exercise limited by: cardiac condition(s)   Goals Addressed             This Visit's Progress    DIET - INCREASE WATER INTAKE       Pt would like to lose weight. Would like to figure out what is at the core of her health issues are.        Depression Screen PHQ 2/9 Scores 02/08/2021 05/04/2020 02/26/2019 02/06/2018 04/29/2017 01/21/2017 10/26/2015  PHQ - 2 Score 0 0 0 1 0 0 1  PHQ- 9 Score - - 0 9 - - -    Fall Risk Fall Risk  02/08/2021 05/04/2020 02/26/2019 02/06/2018 01/21/2017  Falls in the past year? 1 0 1 Yes No  Number falls in past yr: 0 0 1 1 -  Injury with Fall? 1 0 0 No -  Risk for fall due to : History of fall(s);Other (Comment) - - - -  Risk for fall due to: Comment Pt slipped on some steep steps. - - - -  Follow up Falls prevention discussed - Falls evaluation completed Falls evaluation completed -    FALL RISK PREVENTION PERTAINING TO THE HOME:  Any stairs in or around the home? No  If so, are there any without handrails? No  Home free of loose throw rugs in walkways, pet beds, electrical cords, etc? Yes  Adequate lighting in your home to reduce risk of falls? Yes   ASSISTIVE DEVICES UTILIZED TO PREVENT FALLS:  Life alert? No  Use of a cane, walker or w/c? No  Grab bars in the bathroom? No  Shower chair or bench in shower? No  Elevated toilet seat or a handicapped toilet? No   TIMED UP AND GO:  Was the test performed? No . Phone visit.   Cognitive Function:     6CIT Screen 02/08/2021  What Year? 0 points  What month? 0 points  What time?  0 points  Count back from 20 0 points  Months in reverse 0 points  Repeat phrase 0 points  Total Score 0    Immunizations Immunization History  Administered Date(s) Administered   Influenza,inj,Quad PF,6+ Mos 02/02/2016, 04/29/2017, 02/06/2018, 02/26/2019, 03/02/2020   PFIZER(Purple Top)SARS-COV-2 Vaccination 07/31/2019, 08/21/2019, 02/24/2020, 11/24/2020   Pneumococcal Polysaccharide-23 10/24/2014   Tdap 12/17/2011    TDAP status: Due, Education has been provided regarding the importance of this vaccine. Advised may receive this vaccine at local pharmacy or Health Dept. Aware to provide a copy of the vaccination record if obtained from local pharmacy or Health Dept. Verbalized acceptance and understanding.  Flu Vaccine status: Due, Education has been provided regarding the importance of this vaccine. Advised may receive this vaccine at local pharmacy or Health Dept. Aware to provide a copy of the vaccination record if obtained from local pharmacy or Health Dept. Verbalized acceptance and understanding.  Pneumococcal vaccine status: Due, Education has been provided regarding the importance of this vaccine. Advised may receive this vaccine at local pharmacy or Health Dept. Aware to provide a copy of the vaccination record if obtained from  local pharmacy or Health Dept. Verbalized acceptance and understanding.  Covid-19 vaccine status: Completed vaccines  Qualifies for Shingles Vaccine? Yes   Zostavax completed No   Shingrix Completed?: No.    Education has been provided regarding the importance of this vaccine. Patient has been advised to call insurance company to determine out of pocket expense if they have not yet received this vaccine. Advised may also receive vaccine at local pharmacy or Health Dept. Verbalized acceptance and understanding.  Screening Tests Health Maintenance  Topic Date Due   Zoster Vaccines- Shingrix (1 of 2) Never done   OPHTHALMOLOGY EXAM  10/26/2019    HEMOGLOBIN A1C  08/28/2020   INFLUENZA VACCINE  12/11/2020   URINE MICROALBUMIN  02/27/2021   FOOT EXAM  03/02/2021   TETANUS/TDAP  12/16/2021   MAMMOGRAM  08/25/2022   COLONOSCOPY (Pts 45-17yr Insurance coverage will need to be confirmed)  03/29/2030   COVID-19 Vaccine  Completed   Hepatitis C Screening  Completed   HPV VACCINES  Aged Out   PAP SMEAR-Modifier  Discontinued   HIV Screening  Discontinued    Health Maintenance  Health Maintenance Due  Topic Date Due   Zoster Vaccines- Shingrix (1 of 2) Never done   OPHTHALMOLOGY EXAM  10/26/2019   HEMOGLOBIN A1C  08/28/2020   INFLUENZA VACCINE  12/11/2020   URINE MICROALBUMIN  02/27/2021    Colorectal cancer screening: Type of screening: Colonoscopy. Completed 03/29/2020. Repeat every 10 years  Mammogram status: Completed 08/24/20. Repeat every year  Bone Density status: Completed 09/15/20. Results reflect: Bone density results: OSTEOPENIA. Repeat every 2 years.  Lung Cancer Screening: (Low Dose CT Chest recommended if Age 53-80years, 30 pack-year currently smoking OR have quit w/in 15years.) does not qualify.     Additional Screening:  Hepatitis C Screening: does not qualify.  Vision Screening: Recommended annual ophthalmology exams for early detection of glaucoma and other disorders of the eye. Is the patient up to date with their annual eye exam?  No  Who is the provider or what is the name of the office in which the patient attends annual eye exams? Dr. SNancy Fetter Pt states she will call to schedule appointment.  If pt is not established with a provider, would they like to be referred to a provider to establish care? No .   Dental Screening: Recommended annual dental exams for proper oral hygiene  Community Resource Referral / Chronic Care Management: CRR required this visit?  No   CCM required this visit?  No      Plan:     I have personally reviewed and noted the following in the patient's chart:   Medical and  social history Use of alcohol, tobacco or illicit drugs  Current medications and supplements including opioid prescriptions. Patient is not currently taking opioid prescriptions. Functional ability and status Nutritional status Physical activity Advanced directives List of other physicians Hospitalizations, surgeries, and ER visits in previous 12 months Vitals Screenings to include cognitive, depression, and falls Referrals and appointments  In addition, I have reviewed and discussed with patient certain preventive protocols, quality metrics, and best practice recommendations. A written personalized care plan for preventive services as well as general preventive health recommendations were provided to patient.     MChriss Driver LPN   [id number]  Nurse Notes: Pt states she is doing well. Recently was released from wearing a boot on foot after injury in June. Pt is up to date on all health maintenance except eye  exam. Pt states she will call this week to schedule appointment. Discussed flu, tdap, 2nd prevnar and shingles vaccines. Pt is agreeable and will get vaccines at this office or local pharmacy. Already being followed by CCM.

## 2021-02-08 NOTE — Patient Instructions (Signed)
[name] , Thank you for taking time to come for your Medicare Wellness Visit. I appreciate your ongoing commitment to your health goals. Please review the following plan we discussed and let me know if I can assist you in the future.   Screening recommendations/referrals: Colonoscopy: Done 03/29/20 Repeat in 10 years  Mammogram: Done 08/24/20 Repeat annually  Bone Density: Done 09/15/20 Repeat every 2 years  Recommended yearly ophthalmology/optometry visit for glaucoma screening and checkup Recommended yearly dental visit for hygiene and checkup  Vaccinations: Influenza vaccine: Done 03/02/20 Due this fall. Pneumococcal vaccine: Done 10/24/14 Tdap vaccine: Done 12/17/2011 Repeat in 10 years  Shingles vaccine: Shingrix discussed. Please contact your pharmacy for coverage information.    Covid-19: Done 07/13/19, 08/21/19, 02/24/20, 11/24/20  Advanced directives: Advance directive discussed with you today. Even though you declined this today, please call our office should you change your mind, and we can give you the proper paperwork for you to fill out.   Conditions/risks identified: Aim for 30 minutes of exercise or walking each day, drink 6-8 glasses of water and eat lots of fruits and vegetables.   Next appointment: Follow up in one year for your annual wellness visit. 2023  Preventive Care 40-64 Years, Female Preventive care refers to lifestyle choices and visits with your health care provider that can promote health and wellness. What does preventive care include? A yearly physical exam. This is also called an annual well check. Dental exams once or twice a year. Routine eye exams. Ask your health care provider how often you should have your eyes checked. Personal lifestyle choices, including: Daily care of your teeth and gums. Regular physical activity. Eating a healthy diet. Avoiding tobacco and drug use. Limiting alcohol use. Practicing safe sex. Taking low-dose aspirin daily  starting at age 48. Taking vitamin and mineral supplements as recommended by your health care provider. What happens during an annual well check? The services and screenings done by your health care provider during your annual well check will depend on your age, overall health, lifestyle risk factors, and family history of disease. Counseling  Your health care provider may ask you questions about your: Alcohol use. Tobacco use. Drug use. Emotional well-being. Home and relationship well-being. Sexual activity. Eating habits. Work and work Statistician. Method of birth control. Menstrual cycle. Pregnancy history. Screening  You may have the following tests or measurements: Height, weight, and BMI. Blood pressure. Lipid and cholesterol levels. These may be checked every 5 years, or more frequently if you are over 5 years old. Skin check. Lung cancer screening. You may have this screening every year starting at age 30 if you have a 30-pack-year history of smoking and currently smoke or have quit within the past 15 years. Fecal occult blood test (FOBT) of the stool. You may have this test every year starting at age 70. Flexible sigmoidoscopy or colonoscopy. You may have a sigmoidoscopy every 5 years or a colonoscopy every 10 years starting at age 1. Hepatitis C blood test. Hepatitis B blood test. Sexually transmitted disease (STD) testing. Diabetes screening. This is done by checking your blood sugar (glucose) after you have not eaten for a while (fasting). You may have this done every 1-3 years. Mammogram. This may be done every 1-2 years. Talk to your health care provider about when you should start having regular mammograms. This may depend on whether you have a family history of breast cancer. BRCA-related cancer screening. This may be done if you have a family  history of breast, ovarian, tubal, or peritoneal cancers. Pelvic exam and Pap test. This may be done every 3 years starting  at age 37. Starting at age 33, this may be done every 5 years if you have a Pap test in combination with an HPV test. Bone density scan. This is done to screen for osteoporosis. You may have this scan if you are at high risk for osteoporosis. Discuss your test results, treatment options, and if necessary, the need for more tests with your health care provider. Vaccines  Your health care provider may recommend certain vaccines, such as: Influenza vaccine. This is recommended every year. Tetanus, diphtheria, and acellular pertussis (Tdap, Td) vaccine. You may need a Td booster every 10 years. Zoster vaccine. You may need this after age 31. Pneumococcal 13-valent conjugate (PCV13) vaccine. You may need this if you have certain conditions and were not previously vaccinated. Pneumococcal polysaccharide (PPSV23) vaccine. You may need one or two doses if you smoke cigarettes or if you have certain conditions. Talk to your health care provider about which screenings and vaccines you need and how often you need them. This information is not intended to replace advice given to you by your health care provider. Make sure you discuss any questions you have with your health care provider. Document Released: 05/26/2015 Document Revised: 01/17/2016 Document Reviewed: 02/28/2015 Elsevier Interactive Patient Education  2017 [place] Prevention in the Home Falls can cause injuries. They can happen to people of all ages. There are many things you can do to make your home safe and to help prevent falls. What can I do on the outside of my home? Regularly fix the edges of walkways and driveways and fix any cracks. Remove anything that might make you trip as you walk through a door, such as a raised step or threshold. Trim any bushes or trees on the path to your home. Use bright outdoor lighting. Clear any walking paths of anything that might make someone trip, such as rocks or tools. Regularly check  to see if handrails are loose or broken. Make sure that both sides of any steps have handrails. Any raised decks and porches should have guardrails on the edges. Have any leaves, snow, or ice cleared regularly. Use sand or salt on walking paths during winter. Clean up any spills in your garage right away. This includes oil or grease spills. What can I do in the bathroom? Use night lights. Install grab bars by the toilet and in the tub and shower. Do not use towel bars as grab bars. Use non-skid mats or decals in the tub or shower. If you need to sit down in the shower, use a plastic, non-slip stool. Keep the floor dry. Clean up any water that spills on the floor as soon as it happens. Remove soap buildup in the tub or shower regularly. Attach bath mats securely with double-sided non-slip rug tape. Do not have throw rugs and other things on the floor that can make you trip. What can I do in the bedroom? Use night lights. Make sure that you have a light by your bed that is easy to reach. Do not use any sheets or blankets that are too big for your bed. They should not hang down onto the floor. Have a firm chair that has side arms. You can use this for support while you get dressed. Do not have throw rugs and other things on the floor that can make you  trip. What can I do in the kitchen? Clean up any spills right away. Avoid walking on wet floors. Keep items that you use a lot in easy-to-reach places. If you need to reach something above you, use a strong step stool that has a grab bar. Keep electrical cords out of the way. Do not use floor polish or wax that makes floors slippery. If you must use wax, use non-skid floor wax. Do not have throw rugs and other things on the floor that can make you trip. What can I do with my stairs? Do not leave any items on the stairs. Make sure that there are handrails on both sides of the stairs and use them. Fix handrails that are broken or loose. Make  sure that handrails are as long as the stairways. Check any carpeting to make sure that it is firmly attached to the stairs. Fix any carpet that is loose or worn. Avoid having throw rugs at the top or bottom of the stairs. If you do have throw rugs, attach them to the floor with carpet tape. Make sure that you have a light switch at the top of the stairs and the bottom of the stairs. If you do not have them, ask someone to add them for you. What else can I do to help prevent falls? Wear shoes that: Do not have high heels. Have rubber bottoms. Are comfortable and fit you well. Are closed at the toe. Do not wear sandals. If you use a stepladder: Make sure that it is fully opened. Do not climb a closed stepladder. Make sure that both sides of the stepladder are locked into place. Ask someone to hold it for you, if possible. Clearly mark and make sure that you can see: Any grab bars or handrails. First and last steps. Where the edge of each step is. Use tools that help you move around (mobility aids) if they are needed. These include: Canes. Walkers. Scooters. Crutches. Turn on the lights when you go into a dark area. Replace any light bulbs as soon as they burn out. Set up your furniture so you have a clear path. Avoid moving your furniture around. If any of your floors are uneven, fix them. If there are any pets around you, be aware of where they are. Review your medicines with your doctor. Some medicines can make you feel dizzy. This can increase your chance of falling. Ask your doctor what other things that you can do to help prevent falls. This information is not intended to replace advice given to you by your health care provider. Make sure you discuss any questions you have with your health care provider. Document Released: 02/23/2009 Document Revised: 10/05/2015 Document Reviewed: 06/03/2014 Elsevier Interactive Patient Education  2017 [employer].

## 2021-02-16 DIAGNOSIS — M25532 Pain in left wrist: Secondary | ICD-10-CM | POA: Diagnosis not present

## 2021-02-16 DIAGNOSIS — R2232 Localized swelling, mass and lump, left upper limb: Secondary | ICD-10-CM | POA: Diagnosis not present

## 2021-02-16 DIAGNOSIS — M67432 Ganglion, left wrist: Secondary | ICD-10-CM | POA: Diagnosis not present

## 2021-02-21 ENCOUNTER — Ambulatory Visit (INDEPENDENT_AMBULATORY_CARE_PROVIDER_SITE_OTHER): Payer: Medicare Other | Admitting: *Deleted

## 2021-02-21 ENCOUNTER — Other Ambulatory Visit: Payer: Self-pay

## 2021-02-21 DIAGNOSIS — Z23 Encounter for immunization: Secondary | ICD-10-CM

## 2021-02-26 DIAGNOSIS — M67432 Ganglion, left wrist: Secondary | ICD-10-CM | POA: Diagnosis not present

## 2021-03-02 DIAGNOSIS — H2513 Age-related nuclear cataract, bilateral: Secondary | ICD-10-CM | POA: Diagnosis not present

## 2021-03-02 DIAGNOSIS — I10 Essential (primary) hypertension: Secondary | ICD-10-CM | POA: Diagnosis not present

## 2021-03-02 DIAGNOSIS — G932 Benign intracranial hypertension: Secondary | ICD-10-CM | POA: Diagnosis not present

## 2021-03-08 ENCOUNTER — Other Ambulatory Visit (HOSPITAL_BASED_OUTPATIENT_CLINIC_OR_DEPARTMENT_OTHER): Payer: Self-pay

## 2021-03-08 ENCOUNTER — Ambulatory Visit: Payer: Medicare Other | Attending: Internal Medicine

## 2021-03-08 DIAGNOSIS — Z23 Encounter for immunization: Secondary | ICD-10-CM

## 2021-03-08 MED ORDER — PFIZER COVID-19 VAC BIVALENT 30 MCG/0.3ML IM SUSP
INTRAMUSCULAR | 0 refills | Status: DC
  Filled 2021-03-08: qty 0.3, 1d supply, fill #0

## 2021-03-08 NOTE — Progress Notes (Signed)
   [facility]  Name:  Jacqueline [name]    MRN: [id number] DOB: 1966/02/25  03/08/2021  Jacqueline [name] was observed post Covid-19 immunization for 15 minutes without incident. She was provided with Vaccine Information Sheet and instruction to access the V-Safe system.   Jacqueline [name] was instructed to call 911 with any severe reactions post vaccine: Difficulty breathing  Swelling of face and throat  A fast heartbeat  A bad rash all over body  Dizziness and weakness   Immunizations Administered     Name Date Dose VIS Date Route   Pfizer Covid-19 Vaccine Bivalent Booster 03/08/2021 10:37 AM 0.3 mL 01/10/2021 Intramuscular   Manufacturer: [place]   Lot: [id number]   [place]: [phone]

## 2021-03-13 DIAGNOSIS — M67432 Ganglion, left wrist: Secondary | ICD-10-CM | POA: Diagnosis not present

## 2021-03-13 DIAGNOSIS — M25832 Other specified joint disorders, left wrist: Secondary | ICD-10-CM | POA: Diagnosis not present

## 2021-03-26 ENCOUNTER — Other Ambulatory Visit: Payer: Self-pay

## 2021-03-26 ENCOUNTER — Encounter: Payer: Self-pay | Admitting: Family Medicine

## 2021-03-26 ENCOUNTER — Other Ambulatory Visit: Payer: Medicare Other

## 2021-03-26 DIAGNOSIS — Z13220 Encounter for screening for lipoid disorders: Secondary | ICD-10-CM | POA: Diagnosis not present

## 2021-03-26 DIAGNOSIS — E785 Hyperlipidemia, unspecified: Secondary | ICD-10-CM

## 2021-03-26 DIAGNOSIS — E1143 Type 2 diabetes mellitus with diabetic autonomic (poly)neuropathy: Secondary | ICD-10-CM

## 2021-03-26 DIAGNOSIS — Z136 Encounter for screening for cardiovascular disorders: Secondary | ICD-10-CM | POA: Diagnosis not present

## 2021-03-26 DIAGNOSIS — I10 Essential (primary) hypertension: Secondary | ICD-10-CM | POA: Diagnosis not present

## 2021-03-26 DIAGNOSIS — E1169 Type 2 diabetes mellitus with other specified complication: Secondary | ICD-10-CM | POA: Diagnosis not present

## 2021-03-27 LAB — COMPLETE METABOLIC PANEL WITH GFR
AG Ratio: 1.7 (calc) (ref 1.0–2.5)
ALT: 99 U/L — ABNORMAL HIGH (ref 6–29)
AST: 142 U/L — ABNORMAL HIGH (ref 10–35)
Albumin: 4.8 g/dL (ref 3.6–5.1)
Alkaline phosphatase (APISO): 87 U/L (ref 37–153)
BUN: 10 mg/dL (ref 7–25)
CO2: 29 mmol/L (ref 20–32)
Calcium: 10 mg/dL (ref 8.6–10.4)
Chloride: 97 mmol/L — ABNORMAL LOW (ref 98–110)
Creat: 0.77 mg/dL (ref 0.50–1.03)
Globulin: 2.8 g/dL (calc) (ref 1.9–3.7)
Glucose, Bld: 133 mg/dL — ABNORMAL HIGH (ref 65–99)
Potassium: 4.6 mmol/L (ref 3.5–5.3)
Sodium: 140 mmol/L (ref 135–146)
Total Bilirubin: 0.4 mg/dL (ref 0.2–1.2)
Total Protein: 7.6 g/dL (ref 6.1–8.1)
eGFR: 93 mL/min/{1.73_m2} (ref >=60)

## 2021-03-27 LAB — CBC WITH DIFFERENTIAL/PLATELET
Absolute Monocytes: 376 cells/uL (ref 200–950)
Basophils Absolute: 11 cells/uL (ref 0–200)
Basophils Relative: 0.2 %
Eosinophils Absolute: 91 cells/uL (ref 15–500)
Eosinophils Relative: 1.6 %
HCT: 40.3 % (ref 35.0–45.0)
Hemoglobin: 13.3 g/dL (ref 11.7–15.5)
Lymphs Abs: 2035 cells/uL (ref 850–3900)
MCH: 29.4 pg (ref 27.0–33.0)
MCHC: 33 g/dL (ref 32.0–36.0)
MCV: 89.2 fL (ref 80.0–100.0)
MPV: 10.6 fL (ref 7.5–12.5)
Monocytes Relative: 6.6 %
Neutro Abs: 3186 cells/uL (ref 1500–7800)
Neutrophils Relative %: 55.9 %
Platelets: 315 10*3/uL (ref 140–400)
RBC: 4.52 10*6/uL (ref 3.80–5.10)
RDW: 12.6 % (ref 11.0–15.0)
Total Lymphocyte: 35.7 %
WBC: 5.7 10*3/uL (ref 3.8–10.8)

## 2021-03-27 LAB — LIPID PANEL
Cholesterol: 190 mg/dL (ref <200)
HDL: 44 mg/dL — ABNORMAL LOW (ref >=50)
LDL Cholesterol (Calc): 106 mg/dL (calc) — ABNORMAL HIGH
Non-HDL Cholesterol (Calc): 146 mg/dL (calc) — ABNORMAL HIGH (ref <130)
Total CHOL/HDL Ratio: 4.3 (calc) (ref <5.0)
Triglycerides: 282 mg/dL — ABNORMAL HIGH (ref <150)

## 2021-03-27 LAB — MICROALBUMIN / CREATININE URINE RATIO
Creatinine, Urine: 186 mg/dL (ref 20–275)
Microalb Creat Ratio: 55 mcg/mg creat — ABNORMAL HIGH (ref <30)
Microalb, Ur: 10.3 mg/dL

## 2021-03-27 LAB — HEMOGLOBIN A1C
Hgb A1c MFr Bld: 6.7 % of total Hgb — ABNORMAL HIGH (ref <5.7)
Mean Plasma Glucose: 146 mg/dL
eAG (mmol/L): 8.1 mmol/L

## 2021-03-30 ENCOUNTER — Other Ambulatory Visit (HOSPITAL_BASED_OUTPATIENT_CLINIC_OR_DEPARTMENT_OTHER): Payer: Self-pay

## 2021-03-30 ENCOUNTER — Encounter: Payer: Medicare Other | Admitting: Family Medicine

## 2021-03-30 MED ORDER — ZOSTER VAC RECOMB ADJUVANTED 50 MCG/0.5ML IM SUSR
INTRAMUSCULAR | 1 refills | Status: DC
  Filled 2021-03-30: qty 0.5, 1d supply, fill #0

## 2021-04-13 ENCOUNTER — Encounter: Payer: Medicare Other | Admitting: Family Medicine

## 2021-04-17 ENCOUNTER — Encounter: Payer: Self-pay | Admitting: Family Medicine

## 2021-04-17 ENCOUNTER — Ambulatory Visit (INDEPENDENT_AMBULATORY_CARE_PROVIDER_SITE_OTHER): Payer: Medicare Other | Admitting: Family Medicine

## 2021-04-17 ENCOUNTER — Other Ambulatory Visit: Payer: Self-pay

## 2021-04-17 VITALS — BP 128/98 | HR 90 | Resp 18 | Ht 62.0 in | Wt 187.0 lb

## 2021-04-17 DIAGNOSIS — E785 Hyperlipidemia, unspecified: Secondary | ICD-10-CM | POA: Diagnosis not present

## 2021-04-17 DIAGNOSIS — E1169 Type 2 diabetes mellitus with other specified complication: Secondary | ICD-10-CM

## 2021-04-17 DIAGNOSIS — K7581 Nonalcoholic steatohepatitis (NASH): Secondary | ICD-10-CM | POA: Diagnosis not present

## 2021-04-17 DIAGNOSIS — Z0000 Encounter for general adult medical examination without abnormal findings: Secondary | ICD-10-CM | POA: Diagnosis not present

## 2021-04-17 DIAGNOSIS — E669 Obesity, unspecified: Secondary | ICD-10-CM

## 2021-04-17 DIAGNOSIS — E1143 Type 2 diabetes mellitus with diabetic autonomic (poly)neuropathy: Secondary | ICD-10-CM

## 2021-04-17 DIAGNOSIS — I10 Essential (primary) hypertension: Secondary | ICD-10-CM | POA: Diagnosis not present

## 2021-04-17 NOTE — Progress Notes (Signed)
Subjective:    Patient ID: Jacqueline [name], female    DOB: 1965-10-14, 55 y.o.   MRN: [id number]  HPI patient is a very pleasant 55 year old Caucasian female who is here today for complete physical exam.  Her immunizations are listed below.  All of her immunizations are up-to-date.  Patient had a mammogram in April that was normal.  She has a history of a total abdominal hysterectomy several years ago so she no longer requires Pap smears.  Her colonoscopy is up-to-date.  She had a bone density test earlier this year due to her premature menopause and her T score was found to be -2.3 in her hip.  She is not taking calcium or vitamin D which I recommended today.  Her most recent lab work is listed below and shows a concerning increase in her liver function test due to her nonalcoholic steatohepatitis: Lab on 03/26/2021  Component Date Value Ref Range Status   WBC 03/26/2021 5.7  3.8 - 10.8 Thousand/uL Final   RBC 03/26/2021 4.52  3.80 - 5.10 Million/uL Final   Hemoglobin 03/26/2021 13.3  11.7 - 15.5 g/dL Final   HCT 03/26/2021 40.3  35.0 - 45.0 % Final   MCV 03/26/2021 89.2  80.0 - 100.0 fL Final   MCH 03/26/2021 29.4  27.0 - 33.0 pg Final   MCHC 03/26/2021 33.0  32.0 - 36.0 g/dL Final   RDW 03/26/2021 12.6  11.0 - 15.0 % Final   Platelets 03/26/2021 315  140 - 400 Thousand/uL Final   MPV 03/26/2021 10.6  7.5 - 12.5 fL Final   Neutro Abs 03/26/2021 3,186  1,500 - 7,800 cells/uL Final   Lymphs Abs 03/26/2021 2,035  850 - 3,900 cells/uL Final   Absolute Monocytes 03/26/2021 376  200 - 950 cells/uL Final   Eosinophils Absolute 03/26/2021 91  15 - 500 cells/uL Final   Basophils Absolute 03/26/2021 11  0 - 200 cells/uL Final   Neutrophils Relative % 03/26/2021 55.9  % Final   Total Lymphocyte 03/26/2021 35.7  % Final   Monocytes Relative 03/26/2021 6.6  % Final   Eosinophils Relative 03/26/2021 1.6  % Final   Basophils Relative 03/26/2021 0.2  % Final   Glucose, Bld 03/26/2021 133 (H)  65 - 99  mg/dL Final   Comment: .            Fasting reference interval . For someone without known diabetes, a glucose value >125 mg/dL indicates that they may have diabetes and this should be confirmed with a follow-up test. .    BUN 03/26/2021 10  7 - 25 mg/dL Final   Creat 03/26/2021 0.77  0.50 - 1.03 mg/dL Final   eGFR 03/26/2021 93  > OR = 60 mL/min/1.36m Final   Comment: The eGFR is based on the CKD-EPI 2021 equation. To calculate  the new eGFR from a previous Creatinine or Cystatin C result, go to https://www.kidney.org/professionals/ kdoqi/gfr%5Fcalculator    BUN/Creatinine Ratio 135/57/3220NOT APPLICABLE  6 - 22 (calc) Final   Sodium 03/26/2021 140  135 - 146 mmol/L Final   Potassium 03/26/2021 4.6  3.5 - 5.3 mmol/L Final   Chloride 03/26/2021 97 (L)  98 - 110 mmol/L Final   CO2 03/26/2021 29  20 - 32 mmol/L Final   Calcium 03/26/2021 10.0  8.6 - 10.4 mg/dL Final   Total Protein 03/26/2021 7.6  6.1 - 8.1 g/dL Final   Albumin 03/26/2021 4.8  3.6 - 5.1 g/dL Final   Globulin 03/26/2021 2.8  1.9 - 3.7 g/dL (calc) Final   AG Ratio 03/26/2021 1.7  1.0 - 2.5 (calc) Final   Total Bilirubin 03/26/2021 0.4  0.2 - 1.2 mg/dL Final   Alkaline phosphatase (APISO) 03/26/2021 87  37 - 153 U/L Final   AST 03/26/2021 142 (H)  10 - 35 U/L Final   ALT 03/26/2021 99 (H)  6 - 29 U/L Final   Hgb A1c MFr Bld 03/26/2021 6.7 (H)  <5.7 % of total Hgb Final   Comment: For someone without known diabetes, a hemoglobin A1c value of 6.5% or greater indicates that they may have  diabetes and this should be confirmed with a follow-up  test. . For someone with known diabetes, a value <7% indicates  that their diabetes is well controlled and a value  greater than or equal to 7% indicates suboptimal  control. A1c targets should be individualized based on  duration of diabetes, age, comorbid conditions, and  other considerations. . Currently, no consensus exists regarding use of hemoglobin A1c for  diagnosis of diabetes for children. .    Mean Plasma Glucose 03/26/2021 146  mg/dL Final   eAG (mmol/L) 03/26/2021 8.1  mmol/L Final   Cholesterol 03/26/2021 190  <200 mg/dL Final   HDL 03/26/2021 44 (L)  > OR = 50 mg/dL Final   Triglycerides 03/26/2021 282 (H)  <150 mg/dL Final   Comment: . If a non-fasting specimen was collected, consider repeat triglyceride testing on a fasting specimen if clinically indicated.  Yates Decamp et al. J. of Clin. Lipidol. 9811;9:147-829. Marland Kitchen    LDL Cholesterol (Calc) 03/26/2021 106 (H)  mg/dL (calc) Final   Comment: Reference range: <100 . Desirable range <100 mg/dL for primary prevention;   <70 mg/dL for patients with CHD or diabetic patients  with > or = 2 CHD risk factors. Marland Kitchen LDL-C is now calculated using the Martin-Hopkins  calculation, which is a validated novel method providing  better accuracy than the Friedewald equation in the  estimation of LDL-C.  Cresenciano Genre et al. Annamaria Helling. 5621;308(65): 2061-2068  (http://education.[facility].com/faq/FAQ164)    Total CHOL/HDL Ratio 03/26/2021 4.3  <5.0 (calc) Final   Non-HDL Cholesterol (Calc) 03/26/2021 146 (H)  <130 mg/dL (calc) Final   Comment: For patients with diabetes plus 1 major ASCVD risk  factor, treating to a non-HDL-C goal of <100 mg/dL  (LDL-C of <70 mg/dL) is considered a therapeutic  option.    Creatinine, Urine 03/26/2021 186  20 - 275 mg/dL Final   Microalb, Ur 03/26/2021 10.3  mg/dL Final   Comment: Reference Range Not established    Microalb Creat Ratio 03/26/2021 55 (H)  <30 mcg/mg creat Final   Comment: . The ADA defines abnormalities in albumin excretion as follows: Marland Kitchen Albuminuria Category        Result (mcg/mg creatinine) . Normal to Mildly increased   <30 Moderately increased         30-299  Severely increased           > OR = 300 . The ADA recommends that at least two of three specimens collected within a 3-6 month period be abnormal before considering a patient to  be within a diagnostic category.     Immunization History  Administered Date(s) Administered   Influenza,inj,Quad PF,6+ Mos 02/02/2016, 04/29/2017, 02/06/2018, 02/26/2019, 03/02/2020, 02/21/2021   PFIZER(Purple Top)SARS-COV-2 Vaccination 07/31/2019, 08/21/2019, 02/24/2020, 11/24/2020   Pfizer Covid-19 Vaccine Bivalent Booster 59yr & up 03/08/2021   Pneumococcal Polysaccharide-23 10/24/2014, 02/21/2021   Tdap 12/17/2011   Zoster  Recombinat (Shingrix) 03/30/2021     Past Medical History:  Diagnosis Date   Abnormal transaminases    Allergy    Phreesia 03/01/2020   Anemia    Anxiety disorder    Arthritis    Asthma    Cancer ([place])    Phreesia 03/01/2020   Cellulitis    Chronic headaches    Diabetes mellitus without complication ([place])    Phreesia 03/01/2020   Diverticulitis    DM (diabetes mellitus) (HCC)    Erosive esophagitis    Fatty liver    GERD (gastroesophageal reflux disease)    Hyperlipidemia    Phreesia 03/01/2020   IBS (irritable bowel syndrome)    Kidney stones    Neuromuscular disorder ([place])    Phreesia 03/01/2020   Obesity    Pneumonia    Pseudotumor cerebri    Sinus tachycardia    Past Surgical History:  Procedure Laterality Date   ABDOMINAL HYSTERECTOMY N/A    Phreesia 03/01/2020   ANKLE SURGERY     COLONOSCOPY  06/19/2009   COLONOSCOPY WITH PROPOFOL N/A 03/29/2020   Procedure: COLONOSCOPY WITH PROPOFOL;  Surgeon: Harvel Quale, MD;  Location: [facility];  Service: Gastroenterology;  Laterality: N/A;  9:00   EGD  06/09/2009   FOOT SURGERY     NASAL SEPTUM SURGERY     ovarian cysts     x3   TONSILLECTOMY     TUBAL LIGATION     VAGINAL HYSTERECTOMY     Current Outpatient Medications on File Prior to Visit  Medication Sig Dispense Refill   aspirin EC 81 MG tablet Take 81 mg by mouth daily.     Blood Glucose Monitoring Suppl (BLOOD GLUCOSE SYSTEM PAK) KIT Please dispense based on patient and insurance preference. Use as directed to  monitor FSBS 2x daily. Dx: E11.9. 1 kit 1   cetirizine (ZYRTEC) 10 MG tablet Take 10 mg by mouth daily.     DULoxetine (CYMBALTA) 60 MG capsule Take 2 capsules (120 mg total) by mouth daily. 180 capsule 3   EPINEPHrine 0.3 mg/0.3 mL IJ SOAJ injection Inject 0.3 mg into the muscle as needed for anaphylaxis.     Flaxseed, Linseed, (FLAXSEED OIL PO) Take 1 capsule by mouth daily.     fluticasone (FLONASE) 50 MCG/ACT nasal spray shake liquid AND USE TWO SPRAYS in each nostril DAILY 16 g 6   furosemide (LASIX) 20 MG tablet TAKE ONE TABLET BY MOUTH TWICE DAILY 180 tablet 3   gabapentin (NEURONTIN) 600 MG tablet take ONE-HALF TO TWO TABLETS BY MOUTH THREE TIMES DAILY AS NEEDED 540 tablet 3   glucose blood (ACCU-CHEK GUIDE) test strip USE AS DIRECTED TWICE DAILY TO check blood sugar 100 strip 6   Lancets (ONETOUCH DELICA PLUS [id number]) MISC USE TO TEST BLOOD SUGAR TWICE DAILY 200 each 3   metFORMIN (GLUCOPHAGE) 1000 MG tablet TAKE ONE TABLET BY MOUTH TWICE DAILY WITH FOOD 180 tablet 2   metoprolol succinate (TOPROL-XL) 50 MG 24 hr tablet TAKE ONE TABLET BY MOUTH ONCE DAILY 90 tablet 1   oxcarbazepine (TRILEPTAL) 600 MG tablet TAKE ONE TABLET BY MOUTH ONCE DAILY 90 tablet 3   pantoprazole (PROTONIX) 40 MG tablet TAKE 1 TABLET(40 MG) BY MOUTH DAILY (Patient taking differently: Take 40 mg by mouth daily.) 90 tablet 3   Polyethyl Glycol-Propyl Glycol (SYSTANE OP) Place 1 drop into both eyes daily as needed (dry eyes).     rosuvastatin (CRESTOR) 20 MG tablet TAKE ONE TABLET BY MOUTH  ONCE DAILY 90 tablet 3   triamcinolone cream (KENALOG) 0.1 % Apply 1 application topically 2 (two) times daily as needed (eczema). 80 g 3   TRULICITY 1.5 AF/7.9UX SOPN INJECT 1 PEN INTO THE SKIN ONCE A WEEK 2 mL 6   valACYclovir (VALTREX) 500 MG tablet Take 1 tablet (500 mg total) by mouth 2 (two) times daily as needed (fever blisters). 6 tablet 3   No current facility-administered medications on file prior to visit.    Allergies  Allergen Reactions   Bee Venom Anaphylaxis   Aspirin Hives, Itching and Swelling   Metoclopramide Hives   Codeine     Increases liver enzymes    Eql Antibacterial Hand Soap [Benzalkonium Chloride] Hives   Nsaids     Hallucinations    Social History   Socioeconomic History   Marital status: Married    Spouse name: Charles   Number of children: 3   Years of education: Not on file   Highest education level: Not on file  Occupational History   Occupation: Disabled  Tobacco Use   Smoking status: Former   Smokeless tobacco: Never  Scientific laboratory technician Use: Never used  Substance and Sexual Activity   Alcohol use: No   Drug use: No   Sexual activity: Yes    Birth control/protection: Surgical  Other Topics Concern   Not on file  Social History Narrative   Lives with husband and children.    Social Determinants of Health   Financial Resource Strain: Low Risk    Difficulty of Paying Living Expenses: Not hard at all  Food Insecurity: No Food Insecurity   Worried About Charity fundraiser in the Last Year: Never true   [place] in the Last Year: Never true  Transportation Needs: No Transportation Needs   Lack of Transportation (Medical): No   Lack of Transportation (Non-Medical): No  Physical Activity: Sufficiently Active   Days of Exercise per Week: 5 days   Minutes of Exercise per Session: 60 min  Stress: No Stress Concern Present   Feeling of Stress : Not at all  Social Connections: Moderately Integrated   Frequency of Communication with Friends and Family: More than three times a week   Frequency of Social Gatherings with Friends and Family: More than three times a week   Attends Religious Services: Never   Marine scientist or Organizations: Yes   Attends Archivist Meetings: Never   Marital Status: Married  Human resources officer Violence: Not At Risk   Fear of Current or Ex-Partner: No   Emotionally Abused: No   Physically Abused:  No   Sexually Abused: No   Family History  Problem Relation Age of Onset   Colitis Other    Crohn's disease Other    Breast cancer Other    Ovarian cancer Other    Celiac disease Other    Clotting disorder Other    Cystic fibrosis Other        pat. cousin   Diabetes Mother    Heart disease Other    Irritable bowel syndrome Other    Kidney failure Other 64       mat. nephew   Diabetes Maternal Aunt    Diabetes Son    Diabetes Other        mat. nephew   Colon cancer Neg Hx       Review of Systems  All other systems reviewed and are negative.  Objective:   Physical Exam Vitals reviewed.  Constitutional:      General: She is not in acute distress.    Appearance: She is well-developed. She is not diaphoretic.  HENT:     Head: Normocephalic and atraumatic.     Right Ear: External ear normal.     Left Ear: External ear normal.     Nose: Nose normal.     Mouth/Throat:     Pharynx: No oropharyngeal exudate.  Eyes:     General: No scleral icterus.       Right eye: No discharge.        Left eye: No discharge.     Conjunctiva/sclera: Conjunctivae normal.     Pupils: Pupils are equal, round, and reactive to light.  Neck:     Thyroid: No thyromegaly.     Vascular: No JVD.  Cardiovascular:     Rate and Rhythm: Normal rate and regular rhythm.     Heart sounds: Normal heart sounds. No murmur heard.   No friction rub. No gallop.  Pulmonary:     Effort: Pulmonary effort is normal. No respiratory distress.     Breath sounds: Normal breath sounds. No stridor. No wheezing or rales.  Chest:     Chest wall: No tenderness.  Abdominal:     General: Bowel sounds are normal. There is no distension.     Palpations: Abdomen is soft. There is no mass.     Tenderness: There is no abdominal tenderness. There is no guarding or rebound.  Musculoskeletal:        General: No tenderness. Normal range of motion.     Cervical back: Normal range of motion and neck supple.   Lymphadenopathy:     Cervical: No cervical adenopathy.  Skin:    General: Skin is warm.     Coloration: Skin is not pale.     Findings: No erythema or rash.  Neurological:     Mental Status: She is alert and oriented to person, place, and time.     Cranial Nerves: No cranial nerve deficit.     Motor: No abnormal muscle tone.     Coordination: Coordination normal.     Deep Tendon Reflexes: Reflexes are normal and symmetric.  Psychiatric:        Behavior: Behavior normal.        Thought Content: Thought content normal.        Judgment: Judgment normal.          Assessment & Plan:  Encounter for Medicare annual wellness exam  Dyslipidemia associated with type 2 diabetes mellitus ([place])  Essential hypertension, benign  Type 2 diabetes mellitus with diabetic autonomic neuropathy, without long-term current use of insulin (HCC)  Obesity (BMI 30.0-34.9)  NASH (nonalcoholic steatohepatitis) Physical exam today significant for an elevated diastolic blood pressure of 98, a BMI of 34, and elevated liver function test with mild dyslipidemia.  Patient has got to focus on exercise.  I emphasized to her that she needs to exercise 30 minutes a day 5 days a week to try to reduce her weight.  I am concerned that she runs the risk of cirrhosis related to her Karlene Lineman.  Therefore I recommended that she exercise 30 minutes to an hour a day 5 days a week for the next 4 months, try to lose 10 to 15 pounds at least, and recheck liver function test in 4 months.  I have asked her to check her blood pressure at home every day  and notify me of the values in 1 week as her diastolic blood pressure is elevated.  Her A1c is acceptable at 6.7.  Otherwise her shots in her cancer screening are up-to-date.  I did encourage her to take calcium 1200 mg a day and vitamin D 1000 units a day due to her borderline osteoporosis.  Recheck in 4 weeks.

## 2021-04-19 ENCOUNTER — Other Ambulatory Visit: Payer: Self-pay

## 2021-04-19 ENCOUNTER — Telehealth: Payer: Self-pay | Admitting: Pharmacist

## 2021-04-19 MED ORDER — TRULICITY 1.5 MG/0.5ML ~~LOC~~ SOAJ: 1.5000 mg | SUBCUTANEOUS | 2 refills | Status: DC

## 2021-04-19 MED ORDER — PANTOPRAZOLE SODIUM 40 MG PO TBEC: DELAYED_RELEASE_TABLET | ORAL | 3 refills | Status: DC

## 2021-04-19 NOTE — Chronic Care Management (AMB) (Addendum)
Chronic Care Management Pharmacy Assistant   Name: Jacqueline [name]  MRN: [id number] DOB: [birth year]   Reason for Encounter: Medication Coordination Call    Recent office visits:  04/17/2021 OV (PCP) Jacqueline Frizzle, MD;  I did encourage her to take calcium 1200 mg a day and vitamin D 1000 units a day due to her borderline osteoporosis  Recent consult visits:  None  Hospital visits:  None in previous 6 months  Medications: Outpatient Encounter Medications as of 04/19/2021  Medication Sig   aspirin EC 81 MG tablet Take 81 mg by mouth daily.   Blood Glucose Monitoring Suppl (BLOOD GLUCOSE SYSTEM PAK) KIT Please dispense based on patient and insurance preference. Use as directed to monitor FSBS 2x daily. Dx: E11.9.   cetirizine (ZYRTEC) 10 MG tablet Take 10 mg by mouth daily.   DULoxetine (CYMBALTA) 60 MG capsule Take 2 capsules (120 mg total) by mouth daily.   EPINEPHrine 0.3 mg/0.3 mL IJ SOAJ injection Inject 0.3 mg into the muscle as needed for anaphylaxis.   Flaxseed, Linseed, (FLAXSEED OIL PO) Take 1 capsule by mouth daily.   fluticasone (FLONASE) 50 MCG/ACT nasal spray shake liquid AND USE TWO SPRAYS in each nostril DAILY   furosemide (LASIX) 20 MG tablet TAKE ONE TABLET BY MOUTH TWICE DAILY   gabapentin (NEURONTIN) 600 MG tablet take ONE-HALF TO TWO TABLETS BY MOUTH THREE TIMES DAILY AS NEEDED   glucose blood (ACCU-CHEK GUIDE) test strip USE AS DIRECTED TWICE DAILY TO check blood sugar   Lancets (ONETOUCH DELICA PLUS [id number]) MISC USE TO TEST BLOOD SUGAR TWICE DAILY   metFORMIN (GLUCOPHAGE) 1000 MG tablet TAKE ONE TABLET BY MOUTH TWICE DAILY WITH FOOD   metoprolol succinate (TOPROL-XL) 50 MG 24 hr tablet TAKE ONE TABLET BY MOUTH ONCE DAILY   oxcarbazepine (TRILEPTAL) 600 MG tablet TAKE ONE TABLET BY MOUTH ONCE DAILY   pantoprazole (PROTONIX) 40 MG tablet TAKE 1 TABLET(40 MG) BY MOUTH DAILY (Patient taking differently: Take 40 mg by mouth daily.)   Polyethyl Glycol-Propyl  Glycol (SYSTANE OP) Place 1 drop into both eyes daily as needed (dry eyes).   rosuvastatin (CRESTOR) 20 MG tablet TAKE ONE TABLET BY MOUTH ONCE DAILY   triamcinolone cream (KENALOG) 0.1 % Apply 1 application topically 2 (two) times daily as needed (eczema).   TRULICITY 1.5 DX/8.3JA SOPN INJECT 1 PEN INTO THE SKIN ONCE A WEEK   valACYclovir (VALTREX) 500 MG tablet Take 1 tablet (500 mg total) by mouth 2 (two) times daily as needed (fever blisters).   No facility-administered encounter medications on file as of 04/19/2021.    Reviewed chart for medication changes ahead of medication coordination call.  No OVs, Consults, or hospital visits since last care coordination call/Pharmacist visit. (If appropriate, list visit date, provider name)  No medication changes indicated OR if recent visit, treatment plan here.  BP Readings from Last 3 Encounters:  04/17/21 (!) 128/98  12/15/20 134/80  07/12/20 130/79    Lab Results  Component Value Date   HGBA1C 6.7 (H) 03/26/2021     Patient obtains medications through Vials  90 Days    Patient is due for next adherence delivery on: 05/01/2021. Called patient and reviewed medications and coordinated delivery.  This delivery to include: Duloxetine 60 mg twice daily Metoprolol Succinate 50 mg once daily Metformin 1000 mg twice daily Accuchek Test Strips use twice daily OneTouch Delica Lancets Furosemide 20 mg twice daily Rosuvastatin 20 mg once daily Oxcarbazepine 600 mg once daily  Gabapentin 600 mg .5-2 tablets three times daily as needed Pantoprazole 40 mg once daily Trulicity 1.5 mg once a week   Patient needs refills for: Pantoprazole 40 mg once daily Trulicity 1.5 mg once a week -Message sent to [facility] pool refill request.  Confirmed delivery date of 05/01/2021, advised patient that pharmacy will contact them the morning of delivery.   Jacqueline [name], [place] Pharmacist Assistant [phone]   9 minutes spent in review,  coordination, and documentation.  Reviewed by: Jacqueline [name], PharmD Clinical Pharmacist [phone]

## 2021-05-30 ENCOUNTER — Telehealth: Payer: Self-pay

## 2021-05-30 NOTE — Telephone Encounter (Signed)
[facility] Representative called in about 2 appts this pt had that were coded the same. One of these appts was an actual AWV on 02/08/21 with the nurse advisor and the other was complete annual physical with pt's pcp on 04/17/21. [employer] is asking that the cpt code for the complete physical  exam be resubmitted under the correct code for that exam. Please advise.

## 2021-06-12 ENCOUNTER — Other Ambulatory Visit (HOSPITAL_COMMUNITY): Payer: Self-pay

## 2021-07-06 ENCOUNTER — Other Ambulatory Visit (HOSPITAL_BASED_OUTPATIENT_CLINIC_OR_DEPARTMENT_OTHER): Payer: Self-pay

## 2021-07-06 ENCOUNTER — Telehealth: Payer: Self-pay | Admitting: Family Medicine

## 2021-07-06 MED ORDER — ZOSTER VAC RECOMB ADJUVANTED 50 MCG/0.5ML IM SUSR
INTRAMUSCULAR | 0 refills | Status: DC
  Filled 2021-07-06: qty 0.5, 1d supply, fill #0

## 2021-07-06 NOTE — Telephone Encounter (Signed)
Patient called to follow up on recent cpe and awv visits. Patient received bill from insurance stating same cpt code was used for both visits. Requesting for error to be corrected and for bills to be resubmitted.   Patient has [employer], [place] #[phone].   Please advise at [phone].

## 2021-07-20 ENCOUNTER — Other Ambulatory Visit: Payer: Self-pay

## 2021-07-20 ENCOUNTER — Telehealth: Payer: Self-pay | Admitting: Pharmacist

## 2021-07-20 MED ORDER — METFORMIN HCL 1000 MG PO TABS: 1000.0000 mg | ORAL_TABLET | Freq: Two times a day (BID) | ORAL | 2 refills | Status: DC

## 2021-07-20 MED ORDER — GABAPENTIN 600 MG PO TABS: ORAL_TABLET | ORAL | 3 refills | Status: DC

## 2021-07-20 MED ORDER — ONETOUCH DELICA PLUS LANCET33G MISC: 1.0000 | Freq: Two times a day (BID) | 11 refills | Status: DC

## 2021-07-20 MED ORDER — METOPROLOL SUCCINATE ER 50 MG PO TB24: 50.0000 mg | ORAL_TABLET | Freq: Every day | ORAL | 2 refills | Status: DC

## 2021-07-20 MED ORDER — FUROSEMIDE 20 MG PO TABS: ORAL_TABLET | ORAL | 2 refills | Status: DC

## 2021-07-20 MED ORDER — ACCU-CHEK GUIDE VI STRP: ORAL_STRIP | 11 refills | Status: DC

## 2021-07-20 NOTE — Progress Notes (Signed)
? ? ?Chronic Care Management ?Pharmacy Assistant  ? ?Name: Jacqueline [name]  MRN: [id number] DOB: 1965-09-16 ? ? ?Reason for Encounter: Medication Coordination Call ?  ? ?Recent office visits:  ?None ? ?Recent consult visits:  ?None ? ?Hospital visits:  ?None in previous 6 months ? ?Medications: ?Outpatient Encounter Medications as of 07/20/2021  ?Medication Sig  ? aspirin EC 81 MG tablet Take 81 mg by mouth daily.  ? Blood Glucose Monitoring Suppl (BLOOD GLUCOSE SYSTEM PAK) KIT Please dispense based on patient and insurance preference. Use as directed to monitor FSBS 2x daily. Dx: E11.9.  ? cetirizine (ZYRTEC) 10 MG tablet Take 10 mg by mouth daily.  ? Dulaglutide (TRULICITY) 1.5 QA/8.3MH SOPN Inject 1.5 mg into the skin once a week.  ? DULoxetine (CYMBALTA) 60 MG capsule Take 2 capsules (120 mg total) by mouth daily.  ? EPINEPHrine 0.3 mg/0.3 mL IJ SOAJ injection Inject 0.3 mg into the muscle as needed for anaphylaxis.  ? Flaxseed, Linseed, (FLAXSEED OIL PO) Take 1 capsule by mouth daily.  ? fluticasone (FLONASE) 50 MCG/ACT nasal spray shake liquid AND USE TWO SPRAYS in each nostril DAILY  ? furosemide (LASIX) 20 MG tablet TAKE ONE TABLET BY MOUTH TWICE DAILY  ? gabapentin (NEURONTIN) 600 MG tablet take ONE-HALF TO TWO TABLETS BY MOUTH THREE TIMES DAILY AS NEEDED  ? glucose blood (ACCU-CHEK GUIDE) test strip USE AS DIRECTED TWICE DAILY TO check blood sugar  ? Lancets (ONETOUCH DELICA PLUS [id number]) MISC USE TO TEST BLOOD SUGAR TWICE DAILY  ? metFORMIN (GLUCOPHAGE) 1000 MG tablet TAKE ONE TABLET BY MOUTH TWICE DAILY WITH FOOD  ? metoprolol succinate (TOPROL-XL) 50 MG 24 hr tablet TAKE ONE TABLET BY MOUTH ONCE DAILY  ? oxcarbazepine (TRILEPTAL) 600 MG tablet TAKE ONE TABLET BY MOUTH ONCE DAILY  ? pantoprazole (PROTONIX) 40 MG tablet TAKE 1 TABLET(40 MG) BY MOUTH DAILY  ? Polyethyl Glycol-Propyl Glycol (SYSTANE OP) Place 1 drop into both eyes daily as needed (dry eyes).  ? rosuvastatin (CRESTOR) 20 MG tablet TAKE ONE  TABLET BY MOUTH ONCE DAILY  ? triamcinolone cream (KENALOG) 0.1 % Apply 1 application topically 2 (two) times daily as needed (eczema).  ? valACYclovir (VALTREX) 500 MG tablet Take 1 tablet (500 mg total) by mouth 2 (two) times daily as needed (fever blisters).  ? Zoster Vaccine Adjuvanted [facility]) injection Inject into the muscle.  ? ?No facility-administered encounter medications on file as of 07/20/2021.  ? ?Reviewed chart for medication changes ahead of medication coordination call. ? ?No OVs, Consults, or hospital visits since last care coordination call/Pharmacist visit. ? ?No medication changes indicated. ? ?BP Readings from Last 3 Encounters:  ?04/17/21 (!) 128/98  ?12/15/20 134/80  ?07/12/20 130/79  ?  ?Lab Results  ?Component Value Date  ? HGBA1C 6.7 (H) 03/26/2021  ?  ? ?Patient obtains medications through Vials  90 Days  ? ?Last adherence delivery included:  ?Duloxetine 60 mg twice daily ?Metoprolol Succinate 50 mg once daily ?Metformin 1000 mg twice daily ?Accuchek Test Strips use twice daily ?OneTouch Delica Lancets ?Furosemide 20 mg twice daily ?Rosuvastatin 20 mg once daily ?Oxcarbazepine 600 mg once daily ?Gabapentin 600 mg .5-2 tablets three times daily as needed ?Pantoprazole 40 mg once daily ?Trulicity 1.5 mg once a week ? ?Patient is due for next adherence delivery on: 07/31/2021. ?Called patient and reviewed medications and coordinated delivery. ? ?This delivery to include: ?Duloxetine 60 mg twice daily ?Metoprolol Succinate 50 mg once daily ?Metformin 1000 mg twice daily ?  Accuchek Test Strips use twice daily ?OneTouch Delica Lancets ?Furosemide 20 mg twice daily ?Rosuvastatin 20 mg once daily ?Oxcarbazepine 600 mg once daily ?Gabapentin 600 mg .5-2 tablets three times daily as needed ?Pantoprazole 40 mg once daily ?Trulicity 1.5 mg once a week ? ?Patient needs refills for: ?Metoprolol Succinate 50 mg once daily ?Metformin 1000 mg twice daily ?Accuchek Test Strips use twice daily ?OneTouch  Delica Lancets ?Gabapentin 600 mg .5-2 tablets three times daily as needed ?-Message sent to [facility] Rx refill pool. ? ?Confirmed delivery date of 07/31/2021, advised patient that pharmacy will contact them the morning of delivery.  ? ?Care Gaps: ?Medicare Annual Wellness: Completed 04/17/2021 ?Ophthalmology Exam: Overdue since 10/26/2019 ?Foot Exam: Next due on 04/17/2022 ?Hemoglobin A1C: 6.7% on 03/26/2021 ?Colonoscopy: Next due on 03/29/2030 ?Mammogram: Next due on 08/25/2022 ? ?No future appointments.  ? ?[name], [place] ?Clinical Pharmacist Assistant ?[phone]  ?

## 2021-08-16 NOTE — Telephone Encounter (Signed)
I have reviewed charges and I have corrected the claim and it will be sent out to [facility]. I have advised patient and she verbalized understanding that she could disregard bill at this time. ?

## 2021-08-29 ENCOUNTER — Telehealth: Payer: Self-pay

## 2021-08-29 ENCOUNTER — Other Ambulatory Visit: Payer: Self-pay

## 2021-08-29 MED ORDER — VALACYCLOVIR HCL 500 MG PO TABS: 500.0000 mg | ORAL_TABLET | Freq: Two times a day (BID) | ORAL | 3 refills | Status: DC | PRN

## 2021-08-29 MED ORDER — EPINEPHRINE 0.3 MG/0.3ML IJ SOAJ: 0.3000 mg | INTRAMUSCULAR | 0 refills | Status: DC | PRN

## 2021-08-29 NOTE — Telephone Encounter (Signed)
Both Rx sent to pharmacy ?

## 2021-10-18 ENCOUNTER — Telehealth: Payer: Self-pay | Admitting: Pharmacist

## 2021-10-18 NOTE — Progress Notes (Signed)
Chronic Care Management Pharmacy Assistant   Name: Jacqueline [name]  MRN: [id number] DOB: 09-25-1965   Reason for Encounter: Medication Coordination Call    Recent office visits:  None  Recent consult visits:  None  Hospital visits:  None in previous 6 months  Medications: Outpatient Encounter Medications as of 10/18/2021  Medication Sig   aspirin EC 81 MG tablet Take 81 mg by mouth daily.   Blood Glucose Monitoring Suppl (BLOOD GLUCOSE SYSTEM PAK) KIT Please dispense based on patient and insurance preference. Use as directed to monitor FSBS 2x daily. Dx: E11.9.   cetirizine (ZYRTEC) 10 MG tablet Take 10 mg by mouth daily.   Dulaglutide (TRULICITY) 1.5 QG/9.2EF SOPN Inject 1.5 mg into the skin once a week.   DULoxetine (CYMBALTA) 60 MG capsule Take 2 capsules (120 mg total) by mouth daily.   EPINEPHrine 0.3 mg/0.3 mL IJ SOAJ injection Inject 0.3 mg into the muscle as needed for anaphylaxis.   Flaxseed, Linseed, (FLAXSEED OIL PO) Take 1 capsule by mouth daily.   fluticasone (FLONASE) 50 MCG/ACT nasal spray shake liquid AND USE TWO SPRAYS in each nostril DAILY   furosemide (LASIX) 20 MG tablet TAKE ONE TABLET BY MOUTH TWICE DAILY   gabapentin (NEURONTIN) 600 MG tablet take ONE-HALF TO TWO TABLETS BY MOUTH THREE TIMES DAILY AS NEEDED   glucose blood (ACCU-CHEK GUIDE) test strip USE AS DIRECTED TWICE DAILY TO check blood sugar   Lancets (ONETOUCH DELICA PLUS [id number]) MISC 1 each by Does not apply route 2 (two) times daily.   metFORMIN (GLUCOPHAGE) 1000 MG tablet Take 1 tablet (1,000 mg total) by mouth 2 (two) times daily with a meal.   metoprolol succinate (TOPROL-XL) 50 MG 24 hr tablet Take 1 tablet (50 mg total) by mouth daily. Take with or immediately following a meal.   oxcarbazepine (TRILEPTAL) 600 MG tablet TAKE ONE TABLET BY MOUTH ONCE DAILY   pantoprazole (PROTONIX) 40 MG tablet TAKE 1 TABLET(40 MG) BY MOUTH DAILY   Polyethyl Glycol-Propyl Glycol (SYSTANE OP) Place 1 drop  into both eyes daily as needed (dry eyes).   rosuvastatin (CRESTOR) 20 MG tablet TAKE ONE TABLET BY MOUTH ONCE DAILY   triamcinolone cream (KENALOG) 0.1 % Apply 1 application topically 2 (two) times daily as needed (eczema).   valACYclovir (VALTREX) 500 MG tablet Take 1 tablet (500 mg total) by mouth 2 (two) times daily as needed (fever blisters).   Zoster Vaccine Adjuvanted [facility]) injection Inject into the muscle.   No facility-administered encounter medications on file as of 10/18/2021.   Reviewed chart for medication changes ahead of medication coordination call.  No OVs, Consults, or hospital visits since last care coordination call/Pharmacist visit.  No medication changes indicated.  BP Readings from Last 3 Encounters:  04/17/21 (!) 128/98  12/15/20 134/80  07/12/20 130/79    Lab Results  Component Value Date   HGBA1C 6.7 (H) 03/26/2021     Patient obtains medications through Vials  90 Days   Last adherence delivery included:  Duloxetine 60 mg twice daily Metoprolol Succinate 50 mg once daily Metformin 1000 mg twice daily Accuchek Test Strips use twice daily OneTouch Delica Lancets Furosemide 20 mg twice daily Rosuvastatin 20 mg once daily Oxcarbazepine 600 mg once daily Gabapentin 600 mg .5-2 tablets three times daily as needed Pantoprazole 40 mg once daily Trulicity 1.5 mg once a week  Patient is due for next adherence delivery on: 10/29/2021. Called patient and reviewed medications and coordinated delivery.  This  delivery to include: Duloxetine 60 mg twice daily Metoprolol Succinate 50 mg once daily Metformin 1000 mg twice daily Accuchek Test Strips use twice daily OneTouch Delica Lancets Furosemide 20 mg twice daily Rosuvastatin 20 mg once daily Oxcarbazepine 600 mg once daily Gabapentin 600 mg .5-2 tablets three times daily as needed Pantoprazole 40 mg once daily Trulicity 1.5 mg once a week  Confirmed delivery date of 10/29/2021, advised patient that  pharmacy will contact them the morning of delivery.  Care Gaps: Medicare Annual Wellness: Completed 04/17/2021 Ophthalmology Exam: Overdue since 10/26/2019 Foot Exam: Next due on 04/17/2022 Hemoglobin A1C: 6.7% on 03/26/2021 Colonoscopy: Next due on 03/29/2030 Mammogram: Next due on 08/25/2022  No future appointments.  [name], [place] Pharmacist Assistant [phone]

## 2021-11-08 ENCOUNTER — Encounter: Payer: Self-pay | Admitting: Family Medicine

## 2021-11-08 ENCOUNTER — Telehealth: Payer: Self-pay

## 2021-11-08 NOTE — Telephone Encounter (Signed)
Pt having oral surgery on 7/7 and oral surgeon wants to know what pain meds can pt take besides motrin?

## 2021-11-12 NOTE — Telephone Encounter (Signed)
Spoke with pt, aware of and voiced understanding

## 2021-11-14 NOTE — Telephone Encounter (Signed)
Spoke with pt 11/12/21 spoke with pt, voiced understanding. Nothing at this time.

## 2021-12-10 ENCOUNTER — Encounter (HOSPITAL_COMMUNITY): Payer: Self-pay | Admitting: *Deleted

## 2021-12-10 ENCOUNTER — Emergency Department (HOSPITAL_COMMUNITY)
Admission: EM | Admit: 2021-12-10 | Discharge: 2021-12-10 | Disposition: A | Discharge status: Home / Self Care | Payer: Medicare Other | Attending: Emergency Medicine | Admitting: Emergency Medicine

## 2021-12-10 ENCOUNTER — Other Ambulatory Visit: Payer: Self-pay

## 2021-12-10 DIAGNOSIS — Z7982 Long term (current) use of aspirin: Secondary | ICD-10-CM | POA: Diagnosis not present

## 2021-12-10 DIAGNOSIS — T63441A Toxic effect of venom of bees, accidental (unintentional), initial encounter: Secondary | ICD-10-CM | POA: Diagnosis not present

## 2021-12-10 DIAGNOSIS — M79631 Pain in right forearm: Secondary | ICD-10-CM | POA: Insufficient documentation

## 2021-12-10 DIAGNOSIS — M79641 Pain in right hand: Secondary | ICD-10-CM | POA: Diagnosis present

## 2021-12-10 MED ORDER — PREDNISONE 20 MG PO TABS: 40.0000 mg | ORAL_TABLET | Freq: Every day | ORAL | 0 refills | Status: DC

## 2021-12-10 MED ORDER — DIPHENHYDRAMINE HCL 25 MG PO CAPS
25.0000 mg | ORAL_CAPSULE | Freq: Once | ORAL | Status: AC
  Administered 2021-12-10: 25 mg via ORAL
  Filled 2021-12-10: qty 1

## 2021-12-10 MED ORDER — OXYCODONE HCL 5 MG PO TABS
5.0000 mg | ORAL_TABLET | Freq: Once | ORAL | Status: AC
  Administered 2021-12-10: 5 mg via ORAL
  Filled 2021-12-10: qty 1

## 2021-12-10 MED ORDER — OXYCODONE HCL 5 MG PO TABS: 5.0000 mg | ORAL_TABLET | ORAL | 0 refills | Status: DC | PRN

## 2021-12-10 MED ORDER — DEXAMETHASONE SODIUM PHOSPHATE 10 MG/ML IJ SOLN
10.0000 mg | Freq: Once | INTRAMUSCULAR | Status: AC
  Administered 2021-12-10: 10 mg via INTRAMUSCULAR
  Filled 2021-12-10: qty 1

## 2021-12-10 NOTE — Discharge Instructions (Signed)
Elevate your arm when possible.  Take the medication as directed.  You may start the prednisone tomorrow.  Follow-up with your primary care provider for recheck if needed.

## 2021-12-10 NOTE — ED Triage Notes (Signed)
Pt stung by bee last night to right hand.  Took ibuprofen and benadryl 30m last night, denies any SOB or swelling to site. Denies taking her epipen.

## 2021-12-10 NOTE — ED Provider Notes (Signed)
[facility] EMERGENCY DEPARTMENT Provider Note   CSN: [id number] Arrival date & time: 12/10/21  1149     History  Chief Complaint  Patient presents with   Insect Bite    Jacqueline [name] is a 56 y.o. female.  HPI      Jacqueline [name] is a 56 y.o. female who presents to the Emergency Department complaining of pain of her right hand and forearm secondary to a bee sting that occurred yesterday.  States she put her hand on a wooden railing and felt a sting along the base of her palm.  She is unsure what type of bee stung her, but states it left a stinger that she removed.  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  She initially took Benadryl and 1 ibuprofen when the incident occurred.  She denies any immediate swelling of her hand or arm.  Today, she notes pain radiating up to the level of her elbow.  She states this pain is sharp in quality.  She denies any swelling of her face lips or tongue.  No nausea, vomiting, or chest pain.  She took 1 ibuprofen earlier today.    Home Medications Prior to Admission medications   Medication Sig Start Date End Date Taking? Authorizing Provider  oxyCODONE (ROXICODONE) 5 MG immediate release tablet Take 1 tablet (5 mg total) by mouth every 4 (four) hours as needed for severe pain. 12/10/21  Yes Florencio Hollibaugh, PA-C  predniSONE (DELTASONE) 20 MG tablet Take 2 tablets (40 mg total) by mouth daily. 12/10/21  Yes Tommy Minichiello, PA-C  aspirin EC 81 MG tablet Take 81 mg by mouth daily.    [provider]  Blood Glucose Monitoring Suppl (BLOOD GLUCOSE SYSTEM PAK) KIT Please dispense based on patient and insurance preference. Use as directed to monitor FSBS 2x daily. Dx: E11.9. 09/30/19   Susy Frizzle, MD  cetirizine (ZYRTEC) 10 MG tablet Take 10 mg by mouth daily.    [provider]  Dulaglutide (TRULICITY) 1.5 TK/3.5WS SOPN Inject 1.5 mg into the skin once a week. 04/19/21   Susy Frizzle, MD   DULoxetine (CYMBALTA) 60 MG capsule Take 2 capsules (120 mg total) by mouth daily. 01/23/21   Susy Frizzle, MD  EPINEPHrine 0.3 mg/0.3 mL IJ SOAJ injection Inject 0.3 mg into the muscle as needed for anaphylaxis. 08/29/21   Susy Frizzle, MD  Flaxseed, Linseed, (FLAXSEED OIL PO) Take 1 capsule by mouth daily.    [provider]  fluticasone (FLONASE) 50 MCG/ACT nasal spray shake liquid AND USE TWO SPRAYS in each nostril DAILY 10/27/20   Susy Frizzle, MD  furosemide (LASIX) 20 MG tablet TAKE ONE TABLET BY MOUTH TWICE DAILY 07/20/21   Susy Frizzle, MD  gabapentin (NEURONTIN) 600 MG tablet take ONE-HALF TO TWO TABLETS BY MOUTH THREE TIMES DAILY AS NEEDED 07/20/21   Susy Frizzle, MD  glucose blood (ACCU-CHEK GUIDE) test strip USE AS DIRECTED TWICE DAILY TO check blood sugar 07/20/21   Susy Frizzle, MD  Lancets [facility] DELICA PLUS [id number]) [place] 1 each by Does not apply route 2 (two) times daily. 07/20/21   Susy Frizzle, MD  metFORMIN (GLUCOPHAGE) 1000 MG tablet Take 1 tablet (1,000 mg total) by mouth 2 (two) times daily with a meal. 07/20/21   Susy Frizzle, MD  metoprolol succinate (TOPROL-XL) 50 MG 24 hr tablet Take 1 tablet (50 mg total) by mouth daily.  Take with or immediately following a meal. 07/20/21   Susy Frizzle, MD  oxcarbazepine (TRILEPTAL) 600 MG tablet TAKE ONE TABLET BY MOUTH ONCE DAILY 01/22/21   Susy Frizzle, MD  pantoprazole (PROTONIX) 40 MG tablet TAKE 1 TABLET(40 MG) BY MOUTH DAILY 04/19/21   Susy Frizzle, MD  Polyethyl Glycol-Propyl Glycol (SYSTANE OP) Place 1 drop into both eyes daily as needed (dry eyes).    [provider]  rosuvastatin (CRESTOR) 20 MG tablet TAKE ONE TABLET BY MOUTH ONCE DAILY 01/22/21   Susy Frizzle, MD  triamcinolone cream (KENALOG) 0.1 % Apply 1 application topically 2 (two) times daily as needed (eczema). 01/23/21   Susy Frizzle, MD  valACYclovir (VALTREX) 500 MG tablet Take 1 tablet (500  mg total) by mouth 2 (two) times daily as needed (fever blisters). 08/29/21   Susy Frizzle, MD  Zoster Vaccine Adjuvanted [facility]) injection Inject into the muscle. 07/06/21   Margie Ege, [facility]      Allergies    Bee venom, Aspirin, Metoclopramide, Codeine, Eql antibacterial hand soap [benzalkonium chloride], and Nsaids    Review of Systems   Review of Systems  Constitutional:  Negative for chills and fever.  HENT:  Negative for facial swelling and voice change.   Respiratory:  Negative for shortness of breath.   Cardiovascular:  Negative for chest pain.  Gastrointestinal:  Negative for abdominal pain, nausea and vomiting.  Musculoskeletal:  Negative for arthralgias.  Skin:  Negative for color change.       Bee sting to the base of her right hand  Neurological:  Negative for dizziness, speech difficulty, weakness, numbness and headaches.    Physical Exam Updated Vital Signs BP 130/80 (BP Location: Right Arm)   Pulse 72   Temp 98 F (36.7 C) (Oral)   Resp 16   Ht 5' 2"  (1.575 m)   Wt 82.1 kg   SpO2 96%   BMI 33.11 kg/m  Physical Exam Vitals and nursing note reviewed.  Constitutional:      General: She is not in acute distress.    Appearance: Normal appearance. She is not toxic-appearing.  HENT:     Mouth/Throat:     Mouth: Mucous membranes are moist.     Pharynx: Oropharynx is clear.  Cardiovascular:     Rate and Rhythm: Normal rate and regular rhythm.     Pulses: Normal pulses.  Pulmonary:     Effort: Pulmonary effort is normal. No respiratory distress.     Breath sounds: Normal breath sounds.  Chest:     Chest wall: No tenderness.  Abdominal:     Palpations: Abdomen is soft.  Musculoskeletal:        General: Tenderness present. No swelling. Normal range of motion.     Right hand: Tenderness present. No bony tenderness. Normal range of motion. Normal strength. Normal sensation. There is no disruption of two-point discrimination. Normal capillary refill. Normal  pulse.     Cervical back: Normal range of motion.     Comments: No erythema or edema of the right hand or fingers.  No redness or lymphangitis of the right forearm.  She has full range of motion of the fingers of the right hand and right wrist  Skin:    General: Skin is warm.     Capillary Refill: Capillary refill takes less than 2 seconds.     Findings: No rash.  Neurological:     General: No focal deficit present.  Mental Status: She is alert.     Sensory: No sensory deficit.     Motor: No weakness.     ED Results / Procedures / Treatments   Labs (all labs ordered are listed, but only abnormal results are displayed) Labs Reviewed - No data to display  EKG None  Radiology No results found.  Procedures Procedures    Medications Ordered in ED Medications  diphenhydrAMINE (BENADRYL) capsule 25 mg (25 mg Oral Given 12/10/21 1420)  dexamethasone (DECADRON) injection 10 mg (10 mg Intramuscular Given 12/10/21 1420)  oxyCODONE (Oxy IR/ROXICODONE) immediate release tablet 5 mg (5 mg Oral Given 12/10/21 1420)    ED Course/ Medical Decision Making/ A&P                           Medical Decision Making Patient here for evaluation of pain secondary to a bee sting that occurred last evening.  Reports history of anaphylactic reactions to bee stings has EpiPen but did not use it.  Initially took Benadryl and ibuprofen with some improvement.  She had worsening pain of her right hand and forearm today which prompted her ER visit.  No swelling of her face lips or tongue.  No hives or rash.  On exam, patient well-appearing nontoxic.  Vital signs are reassuring.  Symptoms are localized to the hand and forearm.  No redness or swelling.  Neurovascularly intact.  Some pain with range of motion.  No significant systemic symptoms.  Will give Benadryl, steroid and observe. If symptoms continue to improve, will likely be discharged home with understanding to continue Benadryl and prescription for  short course of prednisone.   Amount and/or Complexity of Data Reviewed Discussion of management or test interpretation with external provider(s): On recheck, patient resting comfortably.  No further complications.  Pain somewhat improved after pain medication given here.  She is agreeable to short course of prednisone, symptoms localized.  She is agreeable to treatment plan and close outpatient follow-up.  Return precautions were discussed.  Risk Prescription drug management.           Final Clinical Impression(s) / ED Diagnoses Final diagnoses:  Bee sting, accidental or unintentional, initial encounter    Rx / DC Orders ED Discharge Orders          Ordered    oxyCODONE (ROXICODONE) 5 MG immediate release tablet  Every 4 hours PRN        12/10/21 1500    predniSONE (DELTASONE) 20 MG tablet  Daily        12/10/21 1500              Kem Parkinson, PA-C 12/10/21 1602    Godfrey Pick, MD 12/11/21 0710

## 2021-12-12 ENCOUNTER — Telehealth: Payer: Self-pay | Admitting: *Deleted

## 2021-12-12 NOTE — Telephone Encounter (Signed)
        Patient  visited [facility] on 12/10/2021  for bee sting / allergic reaction    Telephone encounter attempt :  1st left a message   A HIPAA compliant voice message was left requesting a return call.  Instructed patient to call back at   Instructed patient to call back at [phone]  at their earliest convenience. . [place], Population Health [phone] [address] , [place] [address] Email : [email or web address]

## 2021-12-14 ENCOUNTER — Telehealth: Payer: Self-pay | Admitting: *Deleted

## 2021-12-14 NOTE — Telephone Encounter (Signed)
        Patient  visited [facility] on 12/10/2021  for bee sting    Telephone encounter attempt :  2nd  A HIPAA compliant voice message was left requesting a return call.  Instructed patient to call back at [place] [phone] [address] , [place] [address] Email : [email or web address]

## 2021-12-17 ENCOUNTER — Telehealth: Payer: Self-pay | Admitting: *Deleted

## 2021-12-17 NOTE — Telephone Encounter (Signed)
     Patient  visit on [place]  at 12/10/2021 was for bee sting   Have you been able to follow up with your primary care physician? yes  The patient was  able to obtain any needed medicine or equipment.  Are there diet recommendations that you are having difficulty following? N/A  Patient expresses understanding of discharge instructions and education provided has no other needs at this time.    [place] [phone] [address] , [place] [address] Email : [email or web address]

## 2022-01-16 ENCOUNTER — Other Ambulatory Visit: Payer: Self-pay | Admitting: Family Medicine

## 2022-01-16 ENCOUNTER — Telehealth: Payer: Self-pay | Admitting: Pharmacist

## 2022-01-16 MED ORDER — OXCARBAZEPINE 600 MG PO TABS: ORAL_TABLET | ORAL | 0 refills | Status: DC

## 2022-01-16 MED ORDER — ROSUVASTATIN CALCIUM 20 MG PO TABS: 20.0000 mg | ORAL_TABLET | Freq: Every day | ORAL | 0 refills | Status: DC

## 2022-01-16 NOTE — Progress Notes (Signed)
Chronic Care Management Pharmacy Assistant   Name: Jacqueline [name]  MRN: [id number] DOB: Apr 16, 1966   Reason for Encounter: Medication Coordination Call    Recent office visits:  None since last medication coordination call  Recent consult visits:  None since last medication coordination call  Hospital visits:  12/10/2021 ED visit for Bee Sting -If symptoms continue to improve, will likely be discharged home with understanding to continue Benadryl and prescription for short course of prednisone  Medications: Outpatient Encounter Medications as of 01/16/2022  Medication Sig   aspirin EC 81 MG tablet Take 81 mg by mouth daily.   Blood Glucose Monitoring Suppl (BLOOD GLUCOSE SYSTEM PAK) KIT Please dispense based on patient and insurance preference. Use as directed to monitor FSBS 2x daily. Dx: E11.9.   cetirizine (ZYRTEC) 10 MG tablet Take 10 mg by mouth daily.   Dulaglutide (TRULICITY) 1.5 HU/7.6LY SOPN Inject 1.5 mg into the skin once a week.   DULoxetine (CYMBALTA) 60 MG capsule Take 2 capsules (120 mg total) by mouth daily.   EPINEPHrine 0.3 mg/0.3 mL IJ SOAJ injection Inject 0.3 mg into the muscle as needed for anaphylaxis.   Flaxseed, Linseed, (FLAXSEED OIL PO) Take 1 capsule by mouth daily.   fluticasone (FLONASE) 50 MCG/ACT nasal spray shake liquid AND USE TWO SPRAYS in each nostril DAILY   furosemide (LASIX) 20 MG tablet TAKE ONE TABLET BY MOUTH TWICE DAILY   gabapentin (NEURONTIN) 600 MG tablet take ONE-HALF TO TWO TABLETS BY MOUTH THREE TIMES DAILY AS NEEDED   glucose blood (ACCU-CHEK GUIDE) test strip USE AS DIRECTED TWICE DAILY TO check blood sugar   Lancets (ONETOUCH DELICA PLUS [id number]) MISC 1 each by Does not apply route 2 (two) times daily.   metFORMIN (GLUCOPHAGE) 1000 MG tablet Take 1 tablet (1,000 mg total) by mouth 2 (two) times daily with a meal.   metoprolol succinate (TOPROL-XL) 50 MG 24 hr tablet Take 1 tablet (50 mg total) by mouth daily. Take with or  immediately following a meal.   oxcarbazepine (TRILEPTAL) 600 MG tablet TAKE ONE TABLET BY MOUTH ONCE DAILY   oxyCODONE (ROXICODONE) 5 MG immediate release tablet Take 1 tablet (5 mg total) by mouth every 4 (four) hours as needed for severe pain.   pantoprazole (PROTONIX) 40 MG tablet TAKE 1 TABLET(40 MG) BY MOUTH DAILY   Polyethyl Glycol-Propyl Glycol (SYSTANE OP) Place 1 drop into both eyes daily as needed (dry eyes).   predniSONE (DELTASONE) 20 MG tablet Take 2 tablets (40 mg total) by mouth daily.   rosuvastatin (CRESTOR) 20 MG tablet TAKE ONE TABLET BY MOUTH ONCE DAILY   triamcinolone cream (KENALOG) 0.1 % Apply 1 application topically 2 (two) times daily as needed (eczema).   valACYclovir (VALTREX) 500 MG tablet Take 1 tablet (500 mg total) by mouth 2 (two) times daily as needed (fever blisters).   Zoster Vaccine Adjuvanted [employer]) injection Inject into the muscle.   No facility-administered encounter medications on file as of 01/16/2022.    Reviewed chart for medication changes ahead of medication coordination call.  BP Readings from Last 3 Encounters:  12/10/21 130/80  04/17/21 (!) 128/98  12/15/20 134/80    Lab Results  Component Value Date   HGBA1C 6.7 (H) 03/26/2021     Patient obtains medications through Vials  90 Days   Last adherence delivery included:  Duloxetine 60 mg twice daily Metoprolol Succinate 50 mg once daily Metformin 1000 mg twice daily Accuchek Test Strips use twice daily OneTouch  Delica Lancets Furosemide 20 mg twice daily Rosuvastatin 20 mg once daily Oxcarbazepine 600 mg once daily Gabapentin 600 mg .5-2 tablets three times daily as needed Pantoprazole 40 mg once daily Trulicity 1.5 mg once a week   Patient is due for next adherence delivery on: 01/25/2022. Called patient and reviewed medications and coordinated delivery.  This delivery to include: Duloxetine 60 mg twice daily Metoprolol Succinate 50 mg once daily Metformin 1000 mg twice  daily Accuchek Test Strips use twice daily OneTouch Delica Lancets Furosemide 20 mg twice daily Rosuvastatin 20 mg once daily Oxcarbazepine 600 mg once daily Gabapentin 600 mg .5-2 tablets three times daily as needed Pantoprazole 40 mg once daily Valacyclovir 500 mg tablets Triamcinolone 0.1% cream   Coordinated acute fill for Trulicity to be delivered 01/18/2022.   Patient needs refills for: Trulicity 1.5 mg once a week Oxcarbazepine 600 mg once daily Rosuvastatin 20 mg once daily  Confirmed delivery date of 01/25/2022, advised patient that pharmacy will contact them the morning of delivery.  Care Gaps: Medicare Annual Wellness: Completed 04/17/2021 Ophthalmology Exam: Overdue since 10/26/2019 Foot Exam: Next due on 04/17/2022 Hemoglobin A1C: 6.7% on 03/26/2021 Colonoscopy: Next due on 03/29/2030 Mammogram: Next due on 08/25/2022  Future Appointments  Date Time Provider Raoul  04/12/2022  8:00 AM [facility]  04/18/2022  8:30 AM Pickard, Cammie Mcgee, MD [facility]    [name], [place] Pharmacist Assistant [phone]

## 2022-01-16 NOTE — Telephone Encounter (Signed)
Requested medication (s) are due for refill today: yes  Requested medication (s) are on the active medication list: yes  Last refill:  04/19/21 #67m/2 RF  Future visit scheduled: yes  Notes to clinic:  Unable to refill per protocol due to failed labs, no updated results.      Requested Prescriptions  Pending Prescriptions Disp Refills   Dulaglutide (TRULICITY) 1.5 MHC/6.2BJSOPN 6 mL 2    Sig: Inject 1.5 mg into the skin once a week.     Endocrinology:  Diabetes - GLP-1 Receptor Agonists Failed - 01/16/2022  1:00 PM      Failed - HBA1C is between 0 and 7.9 and within 180 days    Hgb A1c MFr Bld  Date Value Ref Range Status  03/26/2021 6.7 (H) <5.7 % of total Hgb Final    Comment:    For someone without known diabetes, a hemoglobin A1c value of 6.5% or greater indicates that they may have  diabetes and this should be confirmed with a follow-up  test. . For someone with known diabetes, a value <7% indicates  that their diabetes is well controlled and a value  greater than or equal to 7% indicates suboptimal  control. A1c targets should be individualized based on  duration of diabetes, age, comorbid conditions, and  other considerations. . Currently, no consensus exists regarding use of hemoglobin A1c for diagnosis of diabetes for children. .          Failed - Valid encounter within last 6 months    Recent Outpatient Visits           9 months ago Encounter for Medicare annual wellness exam   [facility]ie Mcge[facility]   1 year ago [place] Scha[place] WCammie Mcgee MD   1 year ago RLQ abdominal pain   [place] [facility] WCammie Mcge[place] MD   1 year ago Cough   [place] Scha[facility] WCammie Mcgee MD   1 year ago Upper respiratory tract infection, unspecified type   [place] Bear NP       Future Appointments             In 3 months Pickard, WCammie Mcgee MD  [facility]            Signed Prescriptions Disp Refills   oxcarbazepine (TRILEPTAL) 600 MG tablet 90 tablet 0    Sig: TAKE ONE TABLET BY MOUTH ONCE DAILY     Neurology: Anticonvulsants - oxcarbazepine Failed - 01/16/2022  1:00 PM      Failed - Valid encounter within last 12 months    Recent Outpatient Visits           9 months ago Encounter for Medicare annual wellness exam   [facility]   1 year ago [place] T, MD   1 year ago RLQ abdominal pain   [place]enn[place] Schaumann WCammie Mcgee MD   1 year ago Cough   [place] SchaumannWCammie Mcgee [place]   1 year ago Upper respiratory tract infection, unspecified type   [place] Bear NP       Future Appointments             In 3 months Pickard, WCammie Mcgee MD [place]  [facility]            Passed - Na in normal range and within 360 days    Sodium  Date Value Ref Range Status  03/26/2021 140 135 - 146 mmol/L Final         Passed - WBC in normal range and within 360 days    WBC  Date Value Ref Range Status  03/26/2021 5.7 3.8 - 10.8 Thousand/uL Final         Passed - PLT in normal range and within 360 days    Platelets  Date Value Ref Range Status  03/26/2021 315 140 - 400 Thousand/uL Final         Passed - HCT in normal range and within 360 days    HCT  Date Value Ref Range Status  03/26/2021 40.3 35.0 - 45.0 % Final         Passed - HGB in normal range and within 360 days    Hemoglobin  Date Value Ref Range Status  03/26/2021 13.3 11.7 - 15.5 g/dL Final         Passed - Cr in normal range and within 360 days    Creat  Date Value Ref Range Status  03/26/2021 0.77 0.50 - 1.03 mg/dL Final   Creatinine, Urine  Date Value Ref Range Status  03/26/2021 186 20 - 275 mg/dL Final         Passed - Completed PHQ-2 or PHQ-9 in the last  360 days       rosuvastatin (CRESTOR) 20 MG tablet 90 tablet 0    Sig: Take 1 tablet (20 mg total) by mouth daily.     Cardiovascular:  Antilipid - Statins 2 Failed - 01/16/2022  1:00 PM      Failed - Valid encounter within last 12 months    Recent Outpatient Visits           9 months ago Encounter for Medicare annual wellness exam   [place] [facility] Pickard, Cammie Mcgee, MD   1 year ago [place], Cammie Mcgee, MD   1 year ago RLQ abdominal pain   [place] Dennard Schaumann, Cammie Mcgee, MD   1 year ago Cough   [place] Dennard Schaumann, Cammie Mcgee, MD   1 year ago Upper respiratory tract infection, unspecified type   [place] Eulogio Bear, NP       Future Appointments             In 3 months Dennard Schaumann, Cammie Mcgee, MD [place], [facility]            Failed - Lipid Panel in normal range within the last 12 months    Cholesterol  Date Value Ref Range Status  03/26/2021 190 <200 mg/dL Final   LDL Cholesterol (Calc)  Date Value Ref Range Status  03/26/2021 106 (H) mg/dL (calc) Final    Comment:    Reference range: <100 . Desirable range <100 mg/dL for primary prevention;   <70 mg/dL for patients with CHD or diabetic patients  with > or = 2 CHD risk factors. Marland Kitchen LDL-C is now calculated using the Martin-Hopkins  calculation, which is a validated novel method providing  better accuracy than the Friedewald equation in the  estimation of LDL-C.  Cresenciano Genre et al. Annamaria Helling. 9211;941(74): 2061-2068  (http://education.[facility].com/faq/FAQ164)    HDL  Date Value Ref Range Status  03/26/2021 44 (  L) > OR = 50 mg/dL Final   Triglycerides  Date Value Ref Range Status  03/26/2021 282 (H) <150 mg/dL Final    Comment:    . If a non-fasting specimen was collected, consider repeat triglyceride testing on a fasting specimen if clinically indicated.  Yates Decamp et al. J. of Clin.  Lipidol. 8338;2:505-397. Marland Kitchen          Passed - Cr in normal range and within 360 days    Creat  Date Value Ref Range Status  03/26/2021 0.77 0.50 - 1.03 mg/dL Final   Creatinine, Urine  Date Value Ref Range Status  03/26/2021 186 20 - 275 mg/dL Final         Passed - Patient is not pregnant

## 2022-01-16 NOTE — Telephone Encounter (Signed)
-----   Message from [name] sent at 01/16/2022 12:32 PM EDT ----- Regarding: Rx refill request Patient has an upcoming delivery with [facility]. She will need refills on the following medications to complete her order:  Trulicity 1.5 mg once a week Oxcarbazepine 600 mg once daily Rosuvastatin 20 mg once daily  Please send refills to [facility].  Thank you,  [name], [place] Pharmacist Assistant [phone]

## 2022-01-16 NOTE — Telephone Encounter (Signed)
Requested Prescriptions  Pending Prescriptions Disp Refills  . Dulaglutide (TRULICITY) 1.5 XA/1.2IN SOPN 6 mL 2    Sig: Inject 1.5 mg into the skin once a week.     Endocrinology:  Diabetes - GLP-1 Receptor Agonists Failed - 01/16/2022  1:00 PM      Failed - HBA1C is between 0 and 7.9 and within 180 days    Hgb A1c MFr Bld  Date Value Ref Range Status  03/26/2021 6.7 (H) <5.7 % of total Hgb Final    Comment:    For someone without known diabetes, a hemoglobin A1c value of 6.5% or greater indicates that they may have  diabetes and this should be confirmed with a follow-up  test. . For someone with known diabetes, a value <7% indicates  that their diabetes is well controlled and a value  greater than or equal to 7% indicates suboptimal  control. A1c targets should be individualized based on  duration of diabetes, age, comorbid conditions, and  other considerations. . Currently, no consensus exists regarding use of hemoglobin A1c for diagnosis of diabetes for children. .          Failed - Valid encounter within last 6 months    Recent Outpatient Visits          9 months ago Encounter for Medicare annual wellness exam   [place] [facility] Dennard Schaumann, Cammie Mcgee, MD   1 year ago [place] Dennard Schaumann, Cammie Mcgee, MD   1 year ago RLQ abdominal pain   [place] Dennard Schaumann, Cammie Mcgee, MD   1 year ago Cough   [place] Dennard Schaumann, Cammie Mcgee, MD   1 year ago Upper respiratory tract infection, unspecified type   [place] Eulogio Bear, NP      Future Appointments            In 3 months Pickard, Cammie Mcgee, MD [place], [facility]           . oxcarbazepine (TRILEPTAL) 600 MG tablet 90 tablet 0    Sig: TAKE ONE TABLET BY MOUTH ONCE DAILY     Neurology: Anticonvulsants - oxcarbazepine Failed - 01/16/2022  1:00 PM      Failed - Valid encounter within last 12 months    Recent  Outpatient Visits          9 months ago Encounter for Medicare annual wellness exam   [place] Pickard, Cammie Mcgee, MD   1 year ago [place] Susy Frizzle, MD   1 year ago RLQ abdominal pain   [place] Dennard Schaumann, Cammie Mcgee, MD   1 year ago Cough   [place] Dennard Schaumann, Cammie Mcgee, MD   1 year ago Upper respiratory tract infection, unspecified type   [place] Eulogio Bear, NP      Future Appointments            In 3 months Pickard, Cammie Mcgee, MD [place], [facility]           Passed - Na in normal range and within 360 days    Sodium  Date Value Ref Range Status  03/26/2021 140 135 - 146 mmol/L Final         Passed - WBC in normal range and within 360 days    WBC  Date Value Ref Range  Status  03/26/2021 5.7 3.8 - 10.8 Thousand/uL Final         Passed - PLT in normal range and within 360 days    Platelets  Date Value Ref Range Status  03/26/2021 315 140 - 400 Thousand/uL Final         Passed - HCT in normal range and within 360 days    HCT  Date Value Ref Range Status  03/26/2021 40.3 35.0 - 45.0 % Final         Passed - HGB in normal range and within 360 days    Hemoglobin  Date Value Ref Range Status  03/26/2021 13.3 11.7 - 15.5 g/dL Final         Passed - Cr in normal range and within 360 days    Creat  Date Value Ref Range Status  03/26/2021 0.77 0.50 - 1.03 mg/dL Final   Creatinine, Urine  Date Value Ref Range Status  03/26/2021 186 20 - 275 mg/dL Final         Passed - Completed PHQ-2 or PHQ-9 in the last 360 days      . rosuvastatin (CRESTOR) 20 MG tablet 90 tablet 0    Sig: Take 1 tablet (20 mg total) by mouth daily.     Cardiovascular:  Antilipid - Statins 2 Failed - 01/16/2022  1:00 PM      Failed - Valid encounter within last 12 months    Recent Outpatient Visits          9 months ago Encounter for Medicare annual  wellness exam   [place] [facility] Pickard, Cammie Mcgee, MD   1 year ago [place], Cammie Mcgee, MD   1 year ago RLQ abdominal pain   [place] Dennard Schaumann, Cammie Mcgee, MD   1 year ago Cough   [place] Dennard Schaumann, Cammie Mcgee, MD   1 year ago Upper respiratory tract infection, unspecified type   [place] Eulogio Bear, NP      Future Appointments            In 3 months Dennard Schaumann, Cammie Mcgee, MD [place], [facility]           Failed - Lipid Panel in normal range within the last 12 months    Cholesterol  Date Value Ref Range Status  03/26/2021 190 <200 mg/dL Final   LDL Cholesterol (Calc)  Date Value Ref Range Status  03/26/2021 106 (H) mg/dL (calc) Final    Comment:    Reference range: <100 . Desirable range <100 mg/dL for primary prevention;   <70 mg/dL for patients with CHD or diabetic patients  with > or = 2 CHD risk factors. Marland Kitchen LDL-C is now calculated using the Martin-Hopkins  calculation, which is a validated novel method providing  better accuracy than the Friedewald equation in the  estimation of LDL-C.  Cresenciano Genre et al. Annamaria Helling. 6734;193(79): 2061-2068  (http://education.[facility].com/faq/FAQ164)    HDL  Date Value Ref Range Status  03/26/2021 44 (L) > OR = 50 mg/dL Final   Triglycerides  Date Value Ref Range Status  03/26/2021 282 (H) <150 mg/dL Final    Comment:    . If a non-fasting specimen was collected, consider repeat triglyceride testing on a fasting specimen if clinically indicated.  Yates Decamp et al. J. of Clin. Lipidol. 0240;9:735-329. Marland Kitchen          Passed - Cr in  normal range and within 360 days    Creat  Date Value Ref Range Status  03/26/2021 0.77 0.50 - 1.03 mg/dL Final   Creatinine, Urine  Date Value Ref Range Status  03/26/2021 186 20 - 275 mg/dL Final         Passed - Patient is not pregnant

## 2022-01-17 NOTE — Telephone Encounter (Signed)
Requested medication (s) are due for refill today: yes  Requested medication (s) are on the active medication list: yes  Last refill:  04/19/21  Future visit scheduled: yes  Notes to clinic:  Unable to refill per protocol, appointment needed.  Patient was future OV scheduled in December, routing for approval.     Requested Prescriptions  Pending Prescriptions Disp Refills   TRULICITY 1.5 JG/2.8ZM SOPN [Pharmacy Med Name: Trulicity 1.5 OQ/9.4 mL subcutaneous pen injector] 6 mL 2    Sig: Inject 1.5 mg into the skin once a week.     Endocrinology:  Diabetes - GLP-1 Receptor Agonists Failed - 01/16/2022 12:41 PM      Failed - HBA1C is between 0 and 7.9 and within 180 days    Hgb A1c MFr Bld  Date Value Ref Range Status  03/26/2021 6.7 (H) <5.7 % of total Hgb Final    Comment:    For someone without known diabetes, a hemoglobin A1c value of 6.5% or greater indicates that they may have  diabetes and this should be confirmed with a follow-up  test. . For someone with known diabetes, a value <7% indicates  that their diabetes is well controlled and a value  greater than or equal to 7% indicates suboptimal  control. A1c targets should be individualized based on  duration of diabetes, age, comorbid conditions, and  other considerations. . Currently, no consensus exists regarding use of hemoglobin A1c for diagnosis of diabetes for children. .          Failed - Valid encounter within last 6 months    Recent Outpatient Visits           9 months ago Encounter for Medicare annual wellness exam   [place] [facility] Dennard Schaumann Cammie Mcgee, MD   1 year ago [place], Cammie Mcgee, MD   1 year ago RLQ abdominal pain   [place] Dennard Schaumann, Cammie Mcgee, MD   1 year ago Cough   [place] Dennard Schaumann Cammie Mcgee, MD   1 year ago Upper respiratory tract infection, unspecified type   [place] Eulogio Bear, NP       Future Appointments             In 3 months Pickard, Cammie Mcgee, MD [place]

## 2022-01-18 ENCOUNTER — Other Ambulatory Visit: Payer: Self-pay | Admitting: Family Medicine

## 2022-01-18 ENCOUNTER — Encounter: Payer: Self-pay | Admitting: Family Medicine

## 2022-01-18 ENCOUNTER — Other Ambulatory Visit: Payer: Self-pay

## 2022-01-18 ENCOUNTER — Other Ambulatory Visit: Payer: Self-pay | Admitting: *Deleted

## 2022-01-18 MED ORDER — TRULICITY 1.5 MG/0.5ML ~~LOC~~ SOAJ: 1.5000 mg | SUBCUTANEOUS | 2 refills | Status: DC

## 2022-01-18 NOTE — Telephone Encounter (Signed)
-----   Message from Edythe Clarity, [facility] sent at 01/18/2022  8:15 AM EDT ----- Hello!  Pharmacy is requesting a new rx for Trulicity sent to [facility] for a delivery upcoming.  Thanks!! Beverly Milch, PharmD, CPP Clinical Pharmacist Practitioner [place] [phone]

## 2022-01-21 NOTE — Telephone Encounter (Signed)
Requested medication (s) are due for refill today: Hello!  Pharmacy is requesting a new rx for Trulicity sent to [facility] for a delivery upcoming  Requested medication (s) are on the active medication list: yes  Last refill:  01/18/22 #68m 2 refills  Future visit scheduled: yes in 2 months  Notes to clinic:  pharmacy requesting a new rx for Trulicity due to upcoming delivery . Do you want to reorder Rx?     Requested Prescriptions  Pending Prescriptions Disp Refills   Dulaglutide (TRULICITY) 1.5 MZY/6.0YTSOPN 6 mL 2    Sig: Inject 1.5 mg into the skin once a week.     Endocrinology:  Diabetes - GLP-1 Receptor Agonists Failed - 01/18/2022  8:46 AM      Failed - HBA1C is between 0 and 7.9 and within 180 days    Hgb A1c MFr Bld  Date Value Ref Range Status  03/26/2021 6.7 (H) <5.7 % of total Hgb Final    Comment:    For someone without known diabetes, a hemoglobin A1c value of 6.5% or greater indicates that they may have  diabetes and this should be confirmed with a follow-up  test. . For someone with known diabetes, a value <7% indicates  that their diabetes is well controlled and a value  greater than or equal to 7% indicates suboptimal  control. A1c targets should be individualized based on  duration of diabetes, age, comorbid conditions, and  other considerations. . Currently, no consensus exists regarding use of hemoglobin A1c for diagnosis of diabetes for children. .          Failed - Valid encounter within last 6 months    Recent Outpatient Visits           9 months ago Encounter for Medicare annual wellness exam   [facility] Scha[facility]ammie Mcgee [facility]   1 year ago [place] WCammie Mcgee MD   1 year ago RLQ abdominal pain   [place] WCammie Mcgee MD   1 year ago Cough   [place] SchaumannWCammie Mcgee [place]   1 year ago Upper respiratory tract infection, unspecified type    [place] Bear NP       Future Appointments             In 2 months Pickard, WCammie Mcgee MD [place]

## 2022-02-07 ENCOUNTER — Ambulatory Visit (INDEPENDENT_AMBULATORY_CARE_PROVIDER_SITE_OTHER): Payer: Medicare Other | Admitting: Family Medicine

## 2022-02-07 ENCOUNTER — Encounter: Payer: Self-pay | Admitting: Family Medicine

## 2022-02-07 VITALS — BP 140/72 | HR 76 | Temp 98.0°F | Ht 62.0 in | Wt 189.0 lb

## 2022-02-07 DIAGNOSIS — Z23 Encounter for immunization: Secondary | ICD-10-CM

## 2022-02-07 DIAGNOSIS — M79672 Pain in left foot: Secondary | ICD-10-CM | POA: Diagnosis not present

## 2022-02-07 NOTE — Progress Notes (Signed)
Acute Office Visit  Subjective:     Patient ID: Jacqueline [name], female    DOB: [birth year], 56 y.o.   MRN: [id number]  Chief Complaint  Patient presents with   Follow-up    lot of pain going across to of L foot from big toe up to ankle.    Jacqueline [name] is a 56 yo female here for left foot pain. She has been unable to bear weight since the injury and reports pain from her big toe to her ankle with mild swelling for almost a week. She cannot identify a mechanism of injury. She describes it as a constant dull pain and denies loss of sensation, numbness, or tingling. She has tried ice, heat, and NSAIDs for the pain without relief.   Past Medical History:  Diagnosis Date   Abnormal transaminases    Allergy    Phreesia 03/01/2020   Anemia    Anxiety disorder    Arthritis    Asthma    Cancer ([place])    Phreesia 03/01/2020   Cellulitis    Chronic headaches    Diabetes mellitus without complication ([place])    Phreesia 03/01/2020   Diverticulitis    DM (diabetes mellitus) ([place])    Erosive esophagitis    Fatty liver    GERD (gastroesophageal reflux disease)    Hyperlipidemia    Phreesia 03/01/2020   IBS (irritable bowel syndrome)    Kidney stones    Neuromuscular disorder ([place])    Phreesia 03/01/2020   Obesity    Pneumonia    Pseudotumor cerebri    Sinus tachycardia    Past Surgical History:  Procedure Laterality Date   ABDOMINAL HYSTERECTOMY N/A    Phreesia 03/01/2020   ANKLE SURGERY     COLONOSCOPY  06/19/2009   COLONOSCOPY WITH PROPOFOL N/A 03/29/2020   Procedure: COLONOSCOPY WITH PROPOFOL;  Surgeon: Harvel Quale, MD;  Location: [facility];  Service: Gastroenterology;  Laterality: N/A;  9:00   EGD  06/09/2009   FOOT SURGERY     NASAL SEPTUM SURGERY     ovarian cysts     x3   TONSILLECTOMY     TUBAL LIGATION     VAGINAL HYSTERECTOMY     Current Outpatient Medications on File Prior to Visit  Medication Sig Dispense Refill   aspirin EC 81 MG tablet Take 81  mg by mouth daily.     Blood Glucose Monitoring Suppl (BLOOD GLUCOSE SYSTEM PAK) KIT Please dispense based on patient and insurance preference. Use as directed to monitor FSBS 2x daily. Dx: E11.9. 1 kit 1   cetirizine (ZYRTEC) 10 MG tablet Take 10 mg by mouth daily.     Dulaglutide (TRULICITY) 1.5 DE/0.8XK SOPN Inject 1.5 mg into the skin once a week. 6 mL 2   DULoxetine (CYMBALTA) 60 MG capsule Take 2 capsules (120 mg total) by mouth daily. 180 capsule 3   EPINEPHrine 0.3 mg/0.3 mL IJ SOAJ injection Inject 0.3 mg into the muscle as needed for anaphylaxis. 1 each 0   Flaxseed, Linseed, (FLAXSEED OIL PO) Take 1 capsule by mouth daily.     fluticasone (FLONASE) 50 MCG/ACT nasal spray shake liquid AND USE TWO SPRAYS in each nostril DAILY 16 g 6   furosemide (LASIX) 20 MG tablet TAKE ONE TABLET BY MOUTH TWICE DAILY 180 tablet 2   gabapentin (NEURONTIN) 600 MG tablet take ONE-HALF TO TWO TABLETS BY MOUTH THREE TIMES DAILY AS NEEDED 540 tablet 3   glucose blood (ACCU-CHEK  GUIDE) test strip USE AS DIRECTED TWICE DAILY TO check blood sugar 100 strip 11   Lancets (ONETOUCH DELICA PLUS [id number]) MISC 1 each by Does not apply route 2 (two) times daily. 200 each 11   metFORMIN (GLUCOPHAGE) 1000 MG tablet Take 1 tablet (1,000 mg total) by mouth 2 (two) times daily with a meal. 180 tablet 2   metoprolol succinate (TOPROL-XL) 50 MG 24 hr tablet Take 1 tablet (50 mg total) by mouth daily. Take with or immediately following a meal. 90 tablet 2   oxcarbazepine (TRILEPTAL) 600 MG tablet TAKE ONE TABLET BY MOUTH ONCE DAILY 90 tablet 0   oxyCODONE (ROXICODONE) 5 MG immediate release tablet Take 1 tablet (5 mg total) by mouth every 4 (four) hours as needed for severe pain. 6 tablet 0   pantoprazole (PROTONIX) 40 MG tablet TAKE 1 TABLET(40 MG) BY MOUTH DAILY 90 tablet 3   Polyethyl Glycol-Propyl Glycol (SYSTANE OP) Place 1 drop into both eyes daily as needed (dry eyes).     predniSONE (DELTASONE) 20 MG tablet Take 2  tablets (40 mg total) by mouth daily. 10 tablet 0   rosuvastatin (CRESTOR) 20 MG tablet Take 1 tablet (20 mg total) by mouth daily. 90 tablet 0   triamcinolone cream (KENALOG) 0.1 % Apply 1 application topically 2 (two) times daily as needed (eczema). 80 g 3   valACYclovir (VALTREX) 500 MG tablet Take 1 tablet (500 mg total) by mouth 2 (two) times daily as needed (fever blisters). 6 tablet 3   Zoster Vaccine Adjuvanted [facility]) injection Inject into the muscle. 0.5 mL 0   No current facility-administered medications on file prior to visit.   Allergies  Allergen Reactions   Bee Venom Anaphylaxis   Aspirin Hives, Itching and Swelling   Metoclopramide Hives   Codeine     Increases liver enzymes    Eql Antibacterial Hand Soap [Benzalkonium Chloride] Hives   Nsaids     Hallucinations     Review of Systems  Musculoskeletal:        See HPI        Objective:    BP (!) 140/72   Pulse 76   Temp 98 F (36.7 C) (Oral)   Ht _0  (1.575 m)   Wt 189 lb (85.7 kg)   SpO2 96%   BMI 34.57 kg/m    Physical Exam Cardiovascular:     Pulses:          Dorsalis pedis pulses are 2+ on the left side.       Posterior tibial pulses are 2+ on the left side.  Musculoskeletal:     Left foot: Decreased range of motion (pain with flexion and extension). Swelling and bony tenderness (navicular) present.  Feet:     Left foot:     Skin integrity: Skin integrity normal.     No results found for any visits on 02/07/22.      Assessment & Plan:   Foot pain, left - Plan: DG Foot Complete Left I am concerned Jacqueline [name] may have a navicular stress fracture. We discussed sending her directly for an xray vs [facility] going [facility] and [facility] decided to proceed [facility] for [facility] management.  No orders of the defined types were placed in this encounter.   Return if symptoms worsen or fail to improve.  Rubie Maid, FNP

## 2022-02-07 NOTE — Addendum Note (Signed)
Addended by: Colman Cater on: 02/07/2022 12:34 PM   Modules accepted: Orders

## 2022-02-11 DIAGNOSIS — M79672 Pain in left foot: Secondary | ICD-10-CM | POA: Diagnosis not present

## 2022-02-14 ENCOUNTER — Ambulatory Visit: Payer: Medicare Other

## 2022-03-04 DIAGNOSIS — H2513 Age-related nuclear cataract, bilateral: Secondary | ICD-10-CM | POA: Diagnosis not present

## 2022-03-04 DIAGNOSIS — G932 Benign intracranial hypertension: Secondary | ICD-10-CM | POA: Diagnosis not present

## 2022-03-04 DIAGNOSIS — I10 Essential (primary) hypertension: Secondary | ICD-10-CM | POA: Diagnosis not present

## 2022-03-04 DIAGNOSIS — E119 Type 2 diabetes mellitus without complications: Secondary | ICD-10-CM | POA: Diagnosis not present

## 2022-03-06 ENCOUNTER — Other Ambulatory Visit (HOSPITAL_BASED_OUTPATIENT_CLINIC_OR_DEPARTMENT_OTHER): Payer: Self-pay

## 2022-03-06 MED ORDER — COMIRNATY 30 MCG/0.3ML IM SUSY
PREFILLED_SYRINGE | INTRAMUSCULAR | 0 refills | Status: AC
  Filled 2022-03-06: qty 0.3, 1d supply, fill #0

## 2022-03-06 MED ORDER — INFLUENZA VAC SPLIT QUAD 0.5 ML IM SUSY
PREFILLED_SYRINGE | INTRAMUSCULAR | 0 refills | Status: DC
  Filled 2022-03-06: qty 0.5, 1d supply, fill #0

## 2022-03-15 DIAGNOSIS — R2232 Localized swelling, mass and lump, left upper limb: Secondary | ICD-10-CM | POA: Diagnosis not present

## 2022-03-15 DIAGNOSIS — M79672 Pain in left foot: Secondary | ICD-10-CM | POA: Diagnosis not present

## 2022-03-25 DIAGNOSIS — M67432 Ganglion, left wrist: Secondary | ICD-10-CM | POA: Diagnosis not present

## 2022-03-25 DIAGNOSIS — M25832 Other specified joint disorders, left wrist: Secondary | ICD-10-CM | POA: Diagnosis not present

## 2022-03-25 DIAGNOSIS — M25532 Pain in left wrist: Secondary | ICD-10-CM | POA: Diagnosis not present

## 2022-04-10 ENCOUNTER — Other Ambulatory Visit: Payer: Medicare Other

## 2022-04-10 DIAGNOSIS — E1169 Type 2 diabetes mellitus with other specified complication: Secondary | ICD-10-CM

## 2022-04-10 DIAGNOSIS — E1143 Type 2 diabetes mellitus with diabetic autonomic (poly)neuropathy: Secondary | ICD-10-CM | POA: Diagnosis not present

## 2022-04-10 DIAGNOSIS — E785 Hyperlipidemia, unspecified: Secondary | ICD-10-CM | POA: Diagnosis not present

## 2022-04-10 DIAGNOSIS — K7581 Nonalcoholic steatohepatitis (NASH): Secondary | ICD-10-CM

## 2022-04-10 DIAGNOSIS — I10 Essential (primary) hypertension: Secondary | ICD-10-CM

## 2022-04-10 DIAGNOSIS — E669 Obesity, unspecified: Secondary | ICD-10-CM

## 2022-04-11 ENCOUNTER — Other Ambulatory Visit: Payer: Self-pay

## 2022-04-11 DIAGNOSIS — M67432 Ganglion, left wrist: Secondary | ICD-10-CM | POA: Diagnosis not present

## 2022-04-11 DIAGNOSIS — R2232 Localized swelling, mass and lump, left upper limb: Secondary | ICD-10-CM | POA: Diagnosis not present

## 2022-04-11 DIAGNOSIS — I70208 Unspecified atherosclerosis of native arteries of extremities, other extremity: Secondary | ICD-10-CM | POA: Diagnosis not present

## 2022-04-11 DIAGNOSIS — I742 Embolism and thrombosis of arteries of the upper extremities: Secondary | ICD-10-CM | POA: Diagnosis not present

## 2022-04-11 LAB — HEMOGLOBIN A1C
Hgb A1c MFr Bld: 7 % of total Hgb — ABNORMAL HIGH (ref <5.7)
Mean Plasma Glucose: 154 mg/dL
eAG (mmol/L): 8.5 mmol/L

## 2022-04-11 LAB — COMPLETE METABOLIC PANEL WITH GFR
AG Ratio: 1.8 (calc) (ref 1.0–2.5)
ALT: 31 U/L — ABNORMAL HIGH (ref 6–29)
AST: 40 U/L — ABNORMAL HIGH (ref 10–35)
Albumin: 4.2 g/dL (ref 3.6–5.1)
Alkaline phosphatase (APISO): 75 U/L (ref 37–153)
BUN: 13 mg/dL (ref 7–25)
CO2: 27 mmol/L (ref 20–32)
Calcium: 9.4 mg/dL (ref 8.6–10.4)
Chloride: 103 mmol/L (ref 98–110)
Creat: 0.72 mg/dL (ref 0.50–1.03)
Globulin: 2.3 g/dL (calc) (ref 1.9–3.7)
Glucose, Bld: 119 mg/dL — ABNORMAL HIGH (ref 65–99)
Potassium: 4.5 mmol/L (ref 3.5–5.3)
Sodium: 142 mmol/L (ref 135–146)
Total Bilirubin: 0.3 mg/dL (ref 0.2–1.2)
Total Protein: 6.5 g/dL (ref 6.1–8.1)
eGFR: 100 mL/min/{1.73_m2} (ref >=60)

## 2022-04-11 LAB — LIPID PANEL
Cholesterol: 169 mg/dL (ref <200)
HDL: 52 mg/dL (ref >=50)
LDL Cholesterol (Calc): 90 mg/dL (calc)
Non-HDL Cholesterol (Calc): 117 mg/dL (calc) (ref <130)
Total CHOL/HDL Ratio: 3.3 (calc) (ref <5.0)
Triglycerides: 173 mg/dL — ABNORMAL HIGH (ref <150)

## 2022-04-11 LAB — CBC WITH DIFFERENTIAL/PLATELET
Absolute Monocytes: 482 cells/uL (ref 200–950)
Basophils Absolute: 20 cells/uL (ref 0–200)
Basophils Relative: 0.3 %
Eosinophils Absolute: 147 cells/uL (ref 15–500)
Eosinophils Relative: 2.2 %
HCT: 36.9 % (ref 35.0–45.0)
Hemoglobin: 12.4 g/dL (ref 11.7–15.5)
Lymphs Abs: 2928 cells/uL (ref 850–3900)
MCH: 28.8 pg (ref 27.0–33.0)
MCHC: 33.6 g/dL (ref 32.0–36.0)
MCV: 85.8 fL (ref 80.0–100.0)
MPV: 10.7 fL (ref 7.5–12.5)
Monocytes Relative: 7.2 %
Neutro Abs: 3122 cells/uL (ref 1500–7800)
Neutrophils Relative %: 46.6 %
Platelets: 327 10*3/uL (ref 140–400)
RBC: 4.3 10*6/uL (ref 3.80–5.10)
RDW: 13.4 % (ref 11.0–15.0)
Total Lymphocyte: 43.7 %
WBC: 6.7 10*3/uL (ref 3.8–10.8)

## 2022-04-11 LAB — MICROALBUMIN / CREATININE URINE RATIO
Creatinine, Urine: 156 mg/dL (ref 20–275)
Microalb Creat Ratio: 15 mcg/mg creat (ref <30)
Microalb, Ur: 2.4 mg/dL

## 2022-04-11 LAB — VITAMIN B12: Vitamin B-12: 1346 pg/mL — ABNORMAL HIGH (ref 200–1100)

## 2022-04-12 ENCOUNTER — Other Ambulatory Visit: Payer: Medicare Other

## 2022-04-15 ENCOUNTER — Telehealth: Payer: Self-pay | Admitting: Pharmacist

## 2022-04-15 NOTE — Progress Notes (Addendum)
Chronic Care Management Pharmacy Assistant   Name: Jacqueline [name]  MRN: [id number] DOB: [birth year]   Reason for Encounter: Medication Coordination Call    Recent office visits:  02/07/2022 OV ([facility]am [facility]) Rubie Maid, FNP; no medication changes indicated.  Recent consult visits:  None  Hospital visits:  None since last medication coordination call  Medications: Outpatient Encounter Medications as of 04/15/2022  Medication Sig   aspirin EC 81 MG tablet Take 81 mg by mouth daily.   Blood Glucose Monitoring Suppl (BLOOD GLUCOSE SYSTEM PAK) KIT Please dispense based on patient and insurance preference. Use as directed to monitor FSBS 2x daily. Dx: E11.9.   cetirizine (ZYRTEC) 10 MG tablet Take 10 mg by mouth daily.   COVID-19 mRNA vaccine 2023-2024 (COMIRNATY) syringe Inject into the muscle.   Dulaglutide (TRULICITY) 1.5 OQ/9.4TM SOPN Inject 1.5 mg into the skin once a week.   DULoxetine (CYMBALTA) 60 MG capsule Take 2 capsules (120 mg total) by mouth daily.   EPINEPHrine 0.3 mg/0.3 mL IJ SOAJ injection Inject 0.3 mg into the muscle as needed for anaphylaxis.   Flaxseed, Linseed, (FLAXSEED OIL PO) Take 1 capsule by mouth daily.   fluticasone (FLONASE) 50 MCG/ACT nasal spray shake liquid AND USE TWO SPRAYS in each nostril DAILY   furosemide (LASIX) 20 MG tablet TAKE ONE TABLET BY MOUTH TWICE DAILY   gabapentin (NEURONTIN) 600 MG tablet take ONE-HALF TO TWO TABLETS BY MOUTH THREE TIMES DAILY AS NEEDED   glucose blood (ACCU-CHEK GUIDE) test strip USE AS DIRECTED TWICE DAILY TO check blood sugar   influenza vac split quadrivalent PF (FLUARIX) 0.5 ML injection Inject into the muscle.   Lancets (ONETOUCH DELICA PLUS [id number]) MISC 1 each by Does not apply route 2 (two) times daily.   metFORMIN (GLUCOPHAGE) 1000 MG tablet Take 1 tablet (1,000 mg total) by mouth 2 (two) times daily with a meal.   metoprolol succinate (TOPROL-XL) 50 MG 24 hr tablet Take 1 tablet (50 mg total) by mouth  daily. Take with or immediately following a meal.   oxcarbazepine (TRILEPTAL) 600 MG tablet TAKE ONE TABLET BY MOUTH ONCE DAILY   oxyCODONE (ROXICODONE) 5 MG immediate release tablet Take 1 tablet (5 mg total) by mouth every 4 (four) hours as needed for severe pain.   pantoprazole (PROTONIX) 40 MG tablet TAKE 1 TABLET(40 MG) BY MOUTH DAILY   Polyethyl Glycol-Propyl Glycol (SYSTANE OP) Place 1 drop into both eyes daily as needed (dry eyes).   predniSONE (DELTASONE) 20 MG tablet Take 2 tablets (40 mg total) by mouth daily.   rosuvastatin (CRESTOR) 20 MG tablet Take 1 tablet (20 mg total) by mouth daily.   triamcinolone cream (KENALOG) 0.1 % Apply 1 application topically 2 (two) times daily as needed (eczema).   valACYclovir (VALTREX) 500 MG tablet Take 1 tablet (500 mg total) by mouth 2 (two) times daily as needed (fever blisters).   Zoster Vaccine Adjuvanted [facility]) injection Inject into the muscle.   No facility-administered encounter medications on file as of 04/15/2022.   Reviewed chart for medication changes ahead of medication coordination call.  No medication changes indicated.  BP Readings from Last 3 Encounters:  02/07/22 (!) 140/72  12/10/21 130/80  04/17/21 (!) 128/98    Lab Results  Component Value Date   HGBA1C 7.0 (H) 04/10/2022     Patient obtains medications through Vials  90 Days   Last adherence delivery included: Duloxetine 60 mg twice daily Metoprolol Succinate 50 mg once daily  Metformin 1000 mg twice daily Accuchek Test Strips use twice daily OneTouch Delica Lancets Furosemide 20 mg twice daily Rosuvastatin 20 mg once daily Oxcarbazepine 600 mg once daily Gabapentin 600 mg .5-2 tablets three times daily as needed Pantoprazole 40 mg once daily Valacyclovir 500 mg tablets Triamcinolone 0.1% cream   Patient is due for next adherence delivery on: 04/25/2022. Called patient and reviewed medications and coordinated delivery.  This delivery to  include: Duloxetine 60 mg twice daily Metoprolol Succinate 50 mg once daily Metformin 1000 mg twice daily Accuchek Test Strips use twice daily OneTouch Delica Lancets Furosemide 20 mg twice daily Rosuvastatin 20 mg once daily Oxcarbazepine 600 mg once daily Gabapentin 600 mg .5-2 tablets three times daily as needed Pantoprazole 40 mg once daily Trulicity once a week Triamcinolone 0.1% cream  Patient needs refills for: Duloxetine 60 mg twice daily Pantoprazole 40 mg once daily Furosemide 20 mg twice daily Metoprolol Succinate 50 mg once daily Metformin 1000 mg twice daily Rosuvastatin 20 mg once daily Oxcarbazepine 600 mg once daily Triamcinolone 0.1% cream  Confirmed delivery date of 04/25/2022, advised patient that pharmacy will contact them the morning of delivery.  Care Gaps: Medicare Annual Wellness: Due soon on 04/17/2021 Ophthalmology Exam: Overdue since 10/26/2019 Foot Exam: Next due on 04/17/2022 Hemoglobin A1C: 7.0% on 04/10/2022 Colonoscopy: Next due on 03/29/2030 Mammogram: Next due on 08/25/2022  Future Appointments  Date Time Provider [place]  04/18/2022  8:30 AM Susy Frizzle, MD [id number]   [name], [place] Pharmacist Assistant [phone]

## 2022-04-18 ENCOUNTER — Other Ambulatory Visit: Payer: Self-pay | Admitting: Family Medicine

## 2022-04-18 ENCOUNTER — Ambulatory Visit (INDEPENDENT_AMBULATORY_CARE_PROVIDER_SITE_OTHER): Payer: Medicare Other | Admitting: Family Medicine

## 2022-04-18 VITALS — BP 118/76 | HR 95 | Ht 62.0 in | Wt 185.0 lb

## 2022-04-18 DIAGNOSIS — E785 Hyperlipidemia, unspecified: Secondary | ICD-10-CM

## 2022-04-18 DIAGNOSIS — E1169 Type 2 diabetes mellitus with other specified complication: Secondary | ICD-10-CM | POA: Diagnosis not present

## 2022-04-18 DIAGNOSIS — I10 Essential (primary) hypertension: Secondary | ICD-10-CM

## 2022-04-18 DIAGNOSIS — K760 Fatty (change of) liver, not elsewhere classified: Secondary | ICD-10-CM | POA: Diagnosis not present

## 2022-04-18 DIAGNOSIS — Z1231 Encounter for screening mammogram for malignant neoplasm of breast: Secondary | ICD-10-CM

## 2022-04-18 DIAGNOSIS — Z0000 Encounter for general adult medical examination without abnormal findings: Secondary | ICD-10-CM | POA: Diagnosis not present

## 2022-04-18 DIAGNOSIS — E1143 Type 2 diabetes mellitus with diabetic autonomic (poly)neuropathy: Secondary | ICD-10-CM | POA: Diagnosis not present

## 2022-04-18 MED ORDER — SEMAGLUTIDE (1 MG/DOSE) 4 MG/3ML ~~LOC~~ SOPN: 1.0000 mg | PEN_INJECTOR | SUBCUTANEOUS | 3 refills | Status: DC

## 2022-04-18 NOTE — Telephone Encounter (Signed)
Requested medication (s) are due for refill today: yes  Requested medication (s) are on the active medication list: yes    Last refill: 01/23/21 80g  3 refills  Future visit scheduled no  Notes to clinic:Not delegated, lease review. Thank you.  Requested Prescriptions  Pending Prescriptions Disp Refills   triamcinolone cream (KENALOG) 0.1 % [Pharmacy Med Name: triamcinolone acetonide 0.1 % topical cream] 80 g 3    Sig: APPLY TO THE AFFECTED AREA(S) TWICE DAILY as needed FOR eczema     Not Delegated - Dermatology:  Corticosteroids Failed - 04/18/2022  4:23 PM      Failed - This refill cannot be delegated      Failed - Valid encounter within last 12 months    Recent Outpatient Visits           1 year ago Encounter for Medicare annual wellness exam   [place] Susy Frizzle, MD   1 year ago [place], Warren T, MD   1 year ago RLQ abdominal pain   [place] Dennard Schaumann, Cammie Mcgee, MD   1 year ago Cough   [place] Dennard Schaumann Cammie Mcgee, MD   1 year ago Upper respiratory tract infection, unspecified type   [place] Eulogio Bear, NP

## 2022-04-18 NOTE — Telephone Encounter (Signed)
Requested Prescriptions  Pending Prescriptions Disp Refills   DULoxetine (CYMBALTA) 60 MG capsule [Pharmacy Med Name: duloxetine 60 mg capsule,delayed release] 180 capsule     Sig: TAKE TWO CAPSULES BY MOUTH ONCE DAILY     Psychiatry: Antidepressants - SNRI - duloxetine Failed - 04/18/2022  2:52 PM      Failed - Valid encounter within last 6 months    Recent Outpatient Visits           1 year ago Encounter for Medicare annual wellness exam   [place] Susy Frizzle, MD   1 year ago [place] Susy Frizzle, MD   1 year ago RLQ abdominal pain   [place] Susy Frizzle, MD   1 year ago Cough   [place] Susy Frizzle, MD   1 year ago Upper respiratory tract infection, unspecified type   [place] Eulogio Bear, NP              Passed - Cr in normal range and within 360 days    Creat  Date Value Ref Range Status  04/10/2022 0.72 0.50 - 1.03 mg/dL Final   Creatinine, Urine  Date Value Ref Range Status  04/10/2022 156 20 - 275 mg/dL Final         Passed - eGFR is 30 or above and within 360 days    GFR, Est African American  Date Value Ref Range Status  02/28/2020 114 > OR = 60 mL/min/1.27m Final   GFR, Est Non African American  Date Value Ref Range Status  02/28/2020 99 > OR = 60 mL/min/1.759mFinal   GFR, Estimated  Date Value Ref Range Status  07/12/2020 >60 >60 mL/min Final    Comment:    (NOTE) Calculated using the CKD-EPI Creatinine Equation (2021)    eGFR  Date Value Ref Range Status  04/10/2022 100 > OR = 60 mL/min/1.7328minal         Passed - Completed PHQ-2 or PHQ-9 in the last 360 days      Passed - Last BP in normal range    BP Readings from Last 1 Encounters:  04/18/22 118/76          pantoprazole (PROTONIX) 40 MG tablet [Pharmacy Med Name: pantoprazole 40 mg tablet,delayed release] 90 tablet     Sig: TAKE ONE TABLET  BY MOUTH ONCE DAILY     Gastroenterology: Proton Pump Inhibitors Failed - 04/18/2022  2:52 PM      Failed - Valid encounter within last 12 months    Recent Outpatient Visits           1 year ago Encounter for Medicare annual wellness exam   [place] Frizzle[place]   1 year ago [place] FrizzleD   1 year ago RLQ abdominal pain   [place] Schaumann[place]Cammie McgeeD   1 year ago Cough   [place] SchaumannarCammie McgeeD   1 year ago Upper respiratory tract infection, unspecified type   [place] NP               metFORMIN (GLUCOPHAGE) 1000 MG tablet [Pharmacy Med Name: metformin 1,000 mg tablet] 180 tablet 3    Sig: TAKE ONE TABLET BY MOUTH TWICE DAILY WITH A MEAL  Endocrinology:  Diabetes - Biguanides Failed - 04/18/2022  2:52 PM      Failed - B12 Level in normal range and within 720 days    Vitamin B-12  Date Value Ref Range Status  04/10/2022 1,346 (H) 200 - 1,100 pg/mL Final         Failed - Valid encounter within last 6 months    Recent Outpatient Visits           1 year ago Encounter for Medicare annual wellness exam   [place] Susy Frizzle, MD   1 year ago [place], Warren T, MD   1 year ago RLQ abdominal pain   [place] Susy Frizzle, MD   1 year ago Cough   [place] Susy Frizzle, MD   1 year ago Upper respiratory tract infection, unspecified type   [place] Eulogio Bear, NP              Passed - Cr in normal range and within 360 days    Creat  Date Value Ref Range Status  04/10/2022 0.72 0.50 - 1.03 mg/dL Final   Creatinine, Urine  Date Value Ref Range Status  04/10/2022 156 20 - 275 mg/dL Final         Passed - HBA1C is between 0 and 7.9 and within 180 days    Hgb  A1c MFr Bld  Date Value Ref Range Status  04/10/2022 7.0 (H) <5.7 % of total Hgb Final    Comment:    For someone without known diabetes, a hemoglobin A1c value of 6.5% or greater indicates that they may have  diabetes and this should be confirmed with a follow-up  test. . For someone with known diabetes, a value <7% indicates  that their diabetes is well controlled and a value  greater than or equal to 7% indicates suboptimal  control. A1c targets should be individualized based on  duration of diabetes, age, comorbid conditions, and  other considerations. . Currently, no consensus exists regarding use of hemoglobin A1c for diagnosis of diabetes for children. .          Passed - eGFR in normal range and within 360 days    GFR, Est African American  Date Value Ref Range Status  02/28/2020 114 > OR = 60 mL/min/1.72m Final   GFR, Est Non African American  Date Value Ref Range Status  02/28/2020 99 > OR = 60 mL/min/1.746mFinal   GFR, Estimated  Date Value Ref Range Status  07/12/2020 >60 >60 mL/min Final    Comment:    (NOTE) Calculated using the CKD-EPI Creatinine Equation (2021)    eGFR  Date Value Ref Range Status  04/10/2022 100 > OR = 60 mL/min/1.7371minal         Passed - CBC within normal limits and completed in the last 12 months    WBC  Date Value Ref Range Status  04/10/2022 6.7 3.8 - 10.8 Thousand/uL Final   RBC  Date Value Ref Range Status  04/10/2022 4.30 3.80 - 5.10 Million/uL Final   Hemoglobin  Date Value Ref Range Status  04/10/2022 12.4 11.7 - 15.5 g/dL Final   HCT  Date Value Ref Range Status  04/10/2022 36.9 35.0 - 45.0 % Final   MCHC  Date Value Ref Range Status  04/10/2022 33.6 32.0 - 36.0 g/dL Final   MCH  Date  Value Ref Range Status  04/10/2022 28.8 27.0 - 33.0 pg Final   MCV  Date Value Ref Range Status  04/10/2022 85.8 80.0 - 100.0 fL Final   No results found for: "PLTCOUNTKUC", "LABPLAT", "POCPLA" RDW  Date Value Ref  Range Status  04/10/2022 13.4 11.0 - 15.0 % Final          metoprolol succinate (TOPROL-XL) 50 MG 24 hr tablet [Pharmacy Med Name: metoprolol succinate ER 50 mg tablet,extended release 24 hr] 90 tablet 3    Sig: TAKE ONE TABLET BY MOUTH ONCE DAILY with OR immediately following A meal     Cardiovascular:  Beta Blockers Failed - 04/18/2022  2:52 PM      Failed - Valid encounter within last 6 months    Recent Outpatient Visits           1 year ago Encounter for Medicare annual wellness exam   [place] Dennard Schaumann, Cammie Mcgee, MD   1 year ago [place] Susy Frizzle, MD   1 year ago RLQ abdominal pain   [place] Dennard Schaumann, Cammie Mcgee, MD   1 year ago Cough   [place] Dennard Schaumann, Cammie Mcgee, MD   1 year ago Upper respiratory tract infection, unspecified type   [place] Noemi Chapel A, NP              Passed - Last BP in normal range    BP Readings from Last 1 Encounters:  04/18/22 118/76         Passed - Last Heart Rate in normal range    Pulse Readings from Last 1 Encounters:  04/18/22 95          furosemide (LASIX) 20 MG tablet [Pharmacy Med Name: furosemide 20 mg tablet] 180 tablet     Sig: TAKE ONE TABLET BY MOUTH TWICE DAILY     Cardiovascular:  Diuretics - Loop Failed - 04/18/2022  2:52 PM      Failed - Mg Level in normal range and within 180 days    No results found for: "MG"       Failed - Valid encounter within last 6 months    Recent Outpatient Visits           1 year ago Encounter for Medicare annual wellness exam   [place] Susy Frizzle, MD   1 year ago [place] Dennard Schaumann, Cammie Mcgee, MD   1 year ago RLQ abdominal pain   [place] Susy Frizzle, MD   1 year ago Cough   [place] Susy Frizzle, MD   1 year ago Upper respiratory tract infection,  unspecified type   [place] Eulogio Bear, NP              Passed - K in normal range and within 180 days    Potassium  Date Value Ref Range Status  04/10/2022 4.5 3.5 - 5.3 mmol/L Final         Passed - Ca in normal range and within 180 days    Calcium  Date Value Ref Range Status  04/10/2022 9.4 8.6 - 10.4 mg/dL Final         Passed - Na in normal range and within 180 days    Sodium  Date Value Ref Range Status  04/10/2022 142 135 -  146 mmol/L Final         Passed - Cr in normal range and within 180 days    Creat  Date Value Ref Range Status  04/10/2022 0.72 0.50 - 1.03 mg/dL Final   Creatinine, Urine  Date Value Ref Range Status  04/10/2022 156 20 - 275 mg/dL Final         Passed - Cl in normal range and within 180 days    Chloride  Date Value Ref Range Status  04/10/2022 103 98 - 110 mmol/L Final         Passed - Last BP in normal range    BP Readings from Last 1 Encounters:  04/18/22 118/76          rosuvastatin (CRESTOR) 20 MG tablet [Pharmacy Med Name: rosuvastatin 20 mg tablet] 90 tablet 3    Sig: TAKE ONE TABLET BY MOUTH ONCE DAILY     Cardiovascular:  Antilipid - Statins 2 Failed - 04/18/2022  2:52 PM      Failed - Valid encounter within last 12 months    Recent Outpatient Visits           1 year ago Encounter for Medicare annual wellness exam   [place], Cammie Mcgee, MD   1 year ago [place] Dennard Schaumann, Cammie Mcgee, MD   1 year ago RLQ abdominal pain   [place] Dennard Schaumann, Cammie Mcgee, MD   1 year ago Cough   [place] Dennard Schaumann, Cammie Mcgee, MD   1 year ago Upper respiratory tract infection, unspecified type   [place], Jessica A, NP              Failed - Lipid Panel in normal range within the last 12 months    Cholesterol  Date Value Ref Range Status  04/10/2022 169 <200 mg/dL Final   LDL  Cholesterol (Calc)  Date Value Ref Range Status  04/10/2022 90 mg/dL (calc) Final    Comment:    Reference range: <100 . Desirable range <100 mg/dL for primary prevention;   <70 mg/dL for patients with CHD or diabetic patients  with > or = 2 CHD risk factors. Marland Kitchen LDL-C is now calculated using the Martin-Hopkins  calculation, which is a validated novel method providing  better accuracy than the Friedewald equation in the  estimation of LDL-C.  Cresenciano Genre et al. Annamaria Helling. 8144;818(56): 2061-2068  (http://education.[facility].com/faq/FAQ164)    HDL  Date Value Ref Range Status  04/10/2022 52 > OR = 50 mg/dL Final   Triglycerides  Date Value Ref Range Status  04/10/2022 173 (H) <150 mg/dL Final         Passed - Cr in normal range and within 360 days    Creat  Date Value Ref Range Status  04/10/2022 0.72 0.50 - 1.03 mg/dL Final   Creatinine, Urine  Date Value Ref Range Status  04/10/2022 156 20 - 275 mg/dL Final         Passed - Patient is not pregnant

## 2022-04-18 NOTE — Telephone Encounter (Signed)
Requested medication (s) are due for refill today: yes  Requested medication (s) are on the active medication list: yes  Last refill:  duloxetine: 01/23/21 #180 3 RF   pantoprazole: 04/19/21 #90 3 RF  Future visit scheduled: no  Notes to clinic:  did not see these meds addressed in today's note   Requested Prescriptions  Pending Prescriptions Disp Refills   DULoxetine (CYMBALTA) 60 MG capsule [Pharmacy Med Name: duloxetine 60 mg capsule,delayed release] 180 capsule     Sig: TAKE TWO CAPSULES BY MOUTH ONCE DAILY     Psychiatry: Antidepressants - SNRI - duloxetine Failed - 04/18/2022  2:52 PM      Failed - Valid encounter within last 6 months    Recent Outpatient Visits           1 year ago Encounter for Medicare annual wellness exam   [place] Susy Frizzle, MD   1 year ago [place] Susy Frizzle, MD   1 year ago RLQ abdominal pain   [place] Dennard Schaumann, Cammie Mcgee, MD   1 year ago Cough   [place] Susy Frizzle, MD   1 year ago Upper respiratory tract infection, unspecified type   [place] Eulogio Bear, NP              Passed - Cr in normal range and within 360 days    Creat  Date Value Ref Range Status  04/10/2022 0.72 0.50 - 1.03 mg/dL Final   Creatinine, Urine  Date Value Ref Range Status  04/10/2022 156 20 - 275 mg/dL Final         Passed - eGFR is 30 or above and within 360 days    GFR, Est African American  Date Value Ref Range Status  02/28/2020 114 > OR = 60 mL/min/1.77m Final   GFR, Est Non African American  Date Value Ref Range Status  02/28/2020 99 > OR = 60 mL/min/1.764mFinal   GFR, Estimated  Date Value Ref Range Status  07/12/2020 >60 >60 mL/min Final    Comment:    (NOTE) Calculated using the CKD-EPI Creatinine Equation (2021)    eGFR  Date Value Ref Range Status  04/10/2022 100 > OR = 60 mL/min/1.7381minal          Passed - Completed PHQ-2 or PHQ-9 in the last 360 days      Passed - Last BP in normal range    BP Readings from Last 1 Encounters:  04/18/22 118/76          pantoprazole (PROTONIX) 40 MG tablet [Pharmacy Med Name: pantoprazole 40 mg tablet,delayed release] 90 tablet     Sig: TAKE ONE TABLET BY MOUTH ONCE DAILY     Gastroenterology: Proton Pump Inhibitors Failed - 04/18/2022  2:52 PM      Failed - Valid encounter within last 12 months    Recent Outpatient Visits           1 year ago Encounter for Medicare annual wellness exam   [place]   [place] year [place]   1 year ago RLQ abdominal pain   [facility]   1 year ago Cough   [facility] Mc[facility]   1 year ago Upper respiratory tract  infection, unspecified type   [place] Eulogio Bear, NP              Signed Prescriptions Disp Refills   metFORMIN (GLUCOPHAGE) 1000 MG tablet 180 tablet 3    Sig: TAKE ONE TABLET BY MOUTH TWICE DAILY WITH A MEAL     Endocrinology:  Diabetes - Biguanides Failed - 04/18/2022  2:52 PM      Failed - B12 Level in normal range and within 720 days    Vitamin B-12  Date Value Ref Range Status  04/10/2022 1,346 (H) 200 - 1,100 pg/mL Final         Failed - Valid encounter within last 6 months    Recent Outpatient Visits           1 year ago Encounter for Medicare annual wellness exam   [facility] Susy Frizzle, MD   1 year ago [place] Susy Frizzle, MD   1 year ago RLQ abdominal pain   [place] Dennard Schaumann, Cammie Mcgee, MD   1 year ago Cough   [place] Susy Frizzle, MD   1 year ago Upper respiratory tract infection, unspecified type   [place] Eulogio Bear, NP               Passed - Cr in normal range and within 360 days    Creat  Date Value Ref Range Status  04/10/2022 0.72 0.50 - 1.03 mg/dL Final   Creatinine, Urine  Date Value Ref Range Status  04/10/2022 156 20 - 275 mg/dL Final         Passed - HBA1C is between 0 and 7.9 and within 180 days    Hgb A1c MFr Bld  Date Value Ref Range Status  04/10/2022 7.0 (H) <5.7 % of total Hgb Final    Comment:    For someone without known diabetes, a hemoglobin A1c value of 6.5% or greater indicates that they may have  diabetes and this should be confirmed with a follow-up  test. . For someone with known diabetes, a value <7% indicates  that their diabetes is well controlled and a value  greater than or equal to 7% indicates suboptimal  control. A1c targets should be individualized based on  duration of diabetes, age, comorbid conditions, and  other considerations. . Currently, no consensus exists regarding use of hemoglobin A1c for diagnosis of diabetes for children. .          Passed - eGFR in normal range and within 360 days    GFR, Est African American  Date Value Ref Range Status  02/28/2020 114 > OR = 60 mL/min/1.44m Final   GFR, Est Non African American  Date Value Ref Range Status  02/28/2020 99 > OR = 60 mL/min/1.735mFinal   GFR, Estimated  Date Value Ref Range Status  07/12/2020 >60 >60 mL/min Final    Comment:    (NOTE) Calculated using the CKD-EPI Creatinine Equation (2021)    eGFR  Date Value Ref Range Status  04/10/2022 100 > OR = 60 mL/min/1.7332minal         Passed - CBC within normal limits and completed in the last 12 months    WBC  Date Value Ref Range Status  04/10/2022 6.7 3.8 - 10.8 Thousand/uL Final   RBC  Date Value Ref Range Status  04/10/2022 4.30 3.80 - 5.10  Million/uL Final   Hemoglobin  Date Value Ref Range Status  04/10/2022 12.4 11.7 - 15.5 g/dL Final   HCT  Date Value Ref Range Status  04/10/2022 36.9 35.0 - 45.0 % Final   MCHC  Date Value  Ref Range Status  04/10/2022 33.6 32.0 - 36.0 g/dL Final   [facility]  Date Value Ref Range Status  04/10/2022 28.8 27.0 - 33.0 pg Final   MCV  Date Value Ref Range Status  04/10/2022 85.8 80.0 - 100.0 fL Final   No results found for: "PLTCOUNTKUC", "LABPLAT", "POCPLA" RDW  Date Value Ref Range Status  04/10/2022 13.4 11.0 - 15.0 % Final          metoprolol succinate (TOPROL-XL) 50 MG 24 hr tablet 90 tablet 3    Sig: TAKE ONE TABLET BY MOUTH ONCE DAILY with OR immediately following A meal     Cardiovascular:  Beta Blockers Failed - 04/18/2022  2:52 PM      Failed - Valid encounter within last 6 months    Recent Outpatient Visits           1 year ago Encounter for Medicare annual wellness exam   [place] Susy Frizzle, MD   1 year ago [place] Susy Frizzle, MD   1 year ago RLQ abdominal pain   [place] Dennard Schaumann, Cammie Mcgee, MD   1 year ago Cough   [place] Susy Frizzle, MD   1 year ago Upper respiratory tract infection, unspecified type   [place] Noemi Chapel A, NP              Passed - Last BP in normal range    BP Readings from Last 1 Encounters:  04/18/22 118/76         Passed - Last Heart Rate in normal range    Pulse Readings from Last 1 Encounters:  04/18/22 95          furosemide (LASIX) 20 MG tablet 180 tablet 3    Sig: TAKE ONE TABLET BY MOUTH TWICE DAILY     Cardiovascular:  Diuretics - Loop Failed - 04/18/2022  2:52 PM      Failed - Mg Level in normal range and within 180 days    No results found for: "MG"       Failed - Valid encounter within last 6 months    Recent Outpatient Visits           1 year ago Encounter for Medicare annual wellness exam   [place] Susy Frizzle, MD   1 year ago [place] Susy Frizzle, MD   1 year ago RLQ abdominal pain   [place] Dennard Schaumann, Cammie Mcgee, MD   1 year ago Cough   [place] Dennard Schaumann, Cammie Mcgee, MD   1 year ago Upper respiratory tract infection, unspecified type   [place] Eulogio Bear, NP              Passed - K in normal range and within 180 days    Potassium  Date Value Ref Range Status  04/10/2022 4.5 3.5 - 5.3 mmol/L Final         Passed - Ca in normal range and within 180 days    Calcium  Date Value Ref Range Status  04/10/2022 9.4 8.6 - 10.4 mg/dL Final         Passed - Na in normal range and within 180 days    Sodium  Date Value Ref Range Status  04/10/2022 142 135 - 146 mmol/L Final         Passed - Cr in normal range and within 180 days    Creat  Date Value Ref Range Status  04/10/2022 0.72 0.50 - 1.03 mg/dL Final   Creatinine, Urine  Date Value Ref Range Status  04/10/2022 156 20 - 275 mg/dL Final         Passed - Cl in normal range and within 180 days    Chloride  Date Value Ref Range Status  04/10/2022 103 98 - 110 mmol/L Final         Passed - Last BP in normal range    BP Readings from Last 1 Encounters:  04/18/22 118/76          rosuvastatin (CRESTOR) 20 MG tablet 90 tablet 3    Sig: TAKE ONE TABLET BY MOUTH ONCE DAILY     Cardiovascular:  Antilipid - Statins 2 Failed - 04/18/2022  2:52 PM      Failed - Valid encounter within last 12 months    Recent Outpatient Visits           1 year ago Encounter for Medicare annual wellness exam   [place] Susy Frizzle, MD   1 year ago [place] Dennard Schaumann, Cammie Mcgee, MD   1 year ago RLQ abdominal pain   [place] Dennard Schaumann, Cammie Mcgee, MD   1 year ago Cough   [place] Dennard Schaumann, Cammie Mcgee, MD   1 year ago Upper respiratory tract infection, unspecified type   [place], Jessica A, NP              Failed - Lipid Panel in normal range within  the last 12 months    Cholesterol  Date Value Ref Range Status  04/10/2022 169 <200 mg/dL Final   LDL Cholesterol (Calc)  Date Value Ref Range Status  04/10/2022 90 mg/dL (calc) Final    Comment:    Reference range: <100 . Desirable range <100 mg/dL for primary prevention;   <70 mg/dL for patients with CHD or diabetic patients  with > or = 2 CHD risk factors. [place] Kitchen LDL-C is now calculated using the Martin-Hopkins  calculation, which is a validated novel method providing  better accuracy than the Friedewald equation in the  estimation of LDL-C.  Cresenciano Genre et al. Annamaria Helling. 6283;662(94): 2061-2068  (http://education.[facility].com/faq/FAQ164)    HDL  Date Value Ref Range Status  04/10/2022 52 > OR = 50 mg/dL Final   Triglycerides  Date Value Ref Range Status  04/10/2022 173 (H) <150 mg/dL Final         Passed - Cr in normal range and within 360 days    Creat  Date Value Ref Range Status  04/10/2022 0.72 0.50 - 1.03 mg/dL Final   Creatinine, Urine  Date Value Ref Range Status  04/10/2022 156 20 - 275 mg/dL Final         Passed - Patient is not pregnant

## 2022-04-18 NOTE — Progress Notes (Signed)
Subjective:    Patient ID: Jacqueline [name], female    DOB: 1966/04/19, 56 y.o.   MRN: [id number]  HPI Patient is a very pleasant 56 year old Caucasian female who is here today for complete physical exam.  Mammo-4/22 Dexa- (t score -2.3) 5/22 Colonoscopy 2021- clear (recheck 10 years) Pap- s/p TAH/BSO Patient states that she will call and schedule her mammogram.  Past medical history significant for nonalcoholic fatty liver disease.  There is been improvement with her liver function test but they are still showing signs of inflammation.  She is taking Trulicity 1.5 mg subcu weekly and tolerating this without difficulty.  However her A1c is elevated at 7.  Her immunizations are up-to-date.  Diabetic foot exam was performed today and is Lab on 04/10/2022  Component Date Value Ref Range Status   WBC 04/10/2022 6.7  3.8 - 10.8 Thousand/uL Final   RBC 04/10/2022 4.30  3.80 - 5.10 Million/uL Final   Hemoglobin 04/10/2022 12.4  11.7 - 15.5 g/dL Final   HCT 04/10/2022 36.9  35.0 - 45.0 % Final   MCV 04/10/2022 85.8  80.0 - 100.0 fL Final   MCH 04/10/2022 28.8  27.0 - 33.0 pg Final   MCHC 04/10/2022 33.6  32.0 - 36.0 g/dL Final   RDW 04/10/2022 13.4  11.0 - 15.0 % Final   Platelets 04/10/2022 327  140 - 400 Thousand/uL Final   MPV 04/10/2022 10.7  7.5 - 12.5 fL Final   Neutro Abs 04/10/2022 3,122  1,500 - 7,800 cells/uL Final   Lymphs Abs 04/10/2022 2,928  850 - 3,900 cells/uL Final   Absolute Monocytes 04/10/2022 482  200 - 950 cells/uL Final   Eosinophils Absolute 04/10/2022 147  15 - 500 cells/uL Final   Basophils Absolute 04/10/2022 20  0 - 200 cells/uL Final   Neutrophils Relative % 04/10/2022 46.6  % Final   Total Lymphocyte 04/10/2022 43.7  % Final   Monocytes Relative 04/10/2022 7.2  % Final   Eosinophils Relative 04/10/2022 2.2  % Final   Basophils Relative 04/10/2022 0.3  % Final   Glucose, Bld 04/10/2022 119 (H)  65 - 99 mg/dL Final   Comment: .            Fasting reference  interval . For someone without known diabetes, a glucose value between 100 and 125 mg/dL is consistent with prediabetes and should be confirmed with a follow-up test. .    BUN 04/10/2022 13  7 - 25 mg/dL Final   Creat 04/10/2022 0.72  0.50 - 1.03 mg/dL Final   eGFR 04/10/2022 100  > OR = 60 mL/min/1.26m Final   BUN/Creatinine Ratio 04/10/2022 SEE NOTE:  6 - 22 (calc) Final   Comment:    Not Reported: BUN and Creatinine are within    reference range. .    Sodium 04/10/2022 142  135 - 146 mmol/L Final   Potassium 04/10/2022 4.5  3.5 - 5.3 mmol/L Final   Chloride 04/10/2022 103  98 - 110 mmol/L Final   CO2 04/10/2022 27  20 - 32 mmol/L Final   Calcium 04/10/2022 9.4  8.6 - 10.4 mg/dL Final   Total Protein 04/10/2022 6.5  6.1 - 8.1 g/dL Final   Albumin 04/10/2022 4.2  3.6 - 5.1 g/dL Final   Globulin 04/10/2022 2.3  1.9 - 3.7 g/dL (calc) Final   AG Ratio 04/10/2022 1.8  1.0 - 2.5 (calc) Final   Total Bilirubin 04/10/2022 0.3  0.2 - 1.2 mg/dL Final   Alkaline  phosphatase (APISO) 04/10/2022 75  37 - 153 U/L Final   AST 04/10/2022 40 (H)  10 - 35 U/L Final   ALT 04/10/2022 31 (H)  6 - 29 U/L Final   Hgb A1c MFr Bld 04/10/2022 7.0 (H)  <5.7 % of total Hgb Final   Comment: For someone without known diabetes, a hemoglobin A1c value of 6.5% or greater indicates that they may have  diabetes and this should be confirmed with a follow-up  test. . For someone with known diabetes, a value <7% indicates  that their diabetes is well controlled and a value  greater than or equal to 7% indicates suboptimal  control. A1c targets should be individualized based on  duration of diabetes, age, comorbid conditions, and  other considerations. . Currently, no consensus exists regarding use of hemoglobin A1c for diagnosis of diabetes for children. .    Mean Plasma Glucose 04/10/2022 154  mg/dL Final   eAG (mmol/L) 04/10/2022 8.5  mmol/L Final   Cholesterol 04/10/2022 169  <200 mg/dL Final   HDL  04/10/2022 52  > OR = 50 mg/dL Final   Triglycerides 04/10/2022 173 (H)  <150 mg/dL Final   LDL Cholesterol (Calc) 04/10/2022 90  mg/dL (calc) Final   Comment: Reference range: <100 . Desirable range <100 mg/dL for primary prevention;   <70 mg/dL for patients with CHD or diabetic patients  with > or = 2 CHD risk factors. Marland Kitchen LDL-C is now calculated using the Martin-Hopkins  calculation, which is a validated novel method providing  better accuracy than the Friedewald equation in the  estimation of LDL-C.  Cresenciano Genre et al. Annamaria Helling. 3790;240(97): 2061-2068  (http://education.[facility].com/faq/FAQ164)    Total CHOL/HDL Ratio 04/10/2022 3.3  <5.0 (calc) Final   Non-HDL Cholesterol (Calc) 04/10/2022 117  <130 mg/dL (calc) Final   Comment: For patients with diabetes plus 1 major ASCVD risk  factor, treating to a non-HDL-C goal of <100 mg/dL  (LDL-C of <70 mg/dL) is considered a therapeutic  option.    Creatinine, Urine 04/10/2022 156  20 - 275 mg/dL Final   Microalb, Ur 04/10/2022 2.4  mg/dL Final   Comment: Reference Range Not established    Microalb Creat Ratio 04/10/2022 15  <30 mcg/mg creat Final   Comment: . The ADA defines abnormalities in albumin excretion as follows: Marland Kitchen Albuminuria Category        Result (mcg/mg creatinine) . Normal to Mildly increased   <30 Moderately increased         30-299  Severely increased           > OR = 300 . The ADA recommends that at least two of three specimens collected within a 3-6 month period be abnormal before considering a patient to be within a diagnostic category.    Vitamin B-12 04/10/2022 1,346 (H)  200 - 1,100 pg/mL Final    Immunization History  Administered Date(s) Administered   COVID-19, mRNA, vaccine(Comirnaty)12 years and older 03/06/2022   Influenza,inj,Quad PF,6+ Mos 02/02/2016, 04/29/2017, 02/06/2018, 02/26/2019, 03/02/2020, 02/21/2021, 03/06/2022   PFIZER(Purple Top)SARS-COV-2 Vaccination 07/31/2019, 08/21/2019,  02/24/2020, 11/24/2020   PNEUMOCOCCAL CONJUGATE-20 02/07/2022   Pfizer Covid-19 Vaccine Bivalent Booster 50yr & up 03/08/2021   Pneumococcal Polysaccharide-23 10/24/2014, 02/21/2021   Tdap 12/17/2011, 02/07/2022   Zoster Recombinat (Shingrix) 03/30/2021, 07/06/2021     Past Medical History:  Diagnosis Date   Abnormal transaminases    Allergy    Phreesia 03/01/2020   Anemia    Anxiety disorder    Arthritis  Asthma    Cancer ([place])    Phreesia 03/01/2020   Cellulitis    Chronic headaches    Diabetes mellitus without complication ([place])    Phreesia 03/01/2020   Diverticulitis    DM (diabetes mellitus) (HCC)    Erosive esophagitis    Fatty liver    GERD (gastroesophageal reflux disease)    Hyperlipidemia    Phreesia 03/01/2020   IBS (irritable bowel syndrome)    Kidney stones    Neuromuscular disorder ([place])    Phreesia 03/01/2020   Obesity    Pneumonia    Pseudotumor cerebri    Sinus tachycardia    Past Surgical History:  Procedure Laterality Date   ABDOMINAL HYSTERECTOMY N/A    Phreesia 03/01/2020   ANKLE SURGERY     COLONOSCOPY  06/19/2009   COLONOSCOPY WITH PROPOFOL N/A 03/29/2020   Procedure: COLONOSCOPY WITH PROPOFOL;  Surgeon: Harvel Quale, MD;  Location: [facility];  Service: Gastroenterology;  Laterality: N/A;  9:00   EGD  06/09/2009   FOOT SURGERY     NASAL SEPTUM SURGERY     ovarian cysts     x3   TONSILLECTOMY     TUBAL LIGATION     VAGINAL HYSTERECTOMY     Current Outpatient Medications on File Prior to Visit  Medication Sig Dispense Refill   aspirin EC 81 MG tablet Take 81 mg by mouth daily.     Blood Glucose Monitoring Suppl (BLOOD GLUCOSE SYSTEM PAK) KIT Please dispense based on patient and insurance preference. Use as directed to monitor FSBS 2x daily. Dx: E11.9. 1 kit 1   cetirizine (ZYRTEC) 10 MG tablet Take 10 mg by mouth daily.     COVID-19 mRNA vaccine 2023-2024 (COMIRNATY) syringe Inject into the muscle. 0.3 mL 0    Dulaglutide (TRULICITY) 1.5 TF/5.7DU SOPN Inject 1.5 mg into the skin once a week. 6 mL 2   DULoxetine (CYMBALTA) 60 MG capsule Take 2 capsules (120 mg total) by mouth daily. 180 capsule 3   EPINEPHrine 0.3 mg/0.3 mL IJ SOAJ injection Inject 0.3 mg into the muscle as needed for anaphylaxis. 1 each 0   Flaxseed, Linseed, (FLAXSEED OIL PO) Take 1 capsule by mouth daily.     fluticasone (FLONASE) 50 MCG/ACT nasal spray shake liquid AND USE TWO SPRAYS in each nostril DAILY 16 g 6   furosemide (LASIX) 20 MG tablet TAKE ONE TABLET BY MOUTH TWICE DAILY 180 tablet 2   gabapentin (NEURONTIN) 600 MG tablet take ONE-HALF TO TWO TABLETS BY MOUTH THREE TIMES DAILY AS NEEDED 540 tablet 3   glucose blood (ACCU-CHEK GUIDE) test strip USE AS DIRECTED TWICE DAILY TO check blood sugar 100 strip 11   influenza vac split quadrivalent PF (FLUARIX) 0.5 ML injection Inject into the muscle. 0.5 mL 0   Lancets (ONETOUCH DELICA PLUS [id number]) MISC 1 each by Does not apply route 2 (two) times daily. 200 each 11   metFORMIN (GLUCOPHAGE) 1000 MG tablet Take 1 tablet (1,000 mg total) by mouth 2 (two) times daily with a meal. 180 tablet 2   metoprolol succinate (TOPROL-XL) 50 MG 24 hr tablet Take 1 tablet (50 mg total) by mouth daily. Take with or immediately following a meal. 90 tablet 2   oxcarbazepine (TRILEPTAL) 600 MG tablet TAKE ONE TABLET BY MOUTH ONCE DAILY 90 tablet 0   oxyCODONE (ROXICODONE) 5 MG immediate release tablet Take 1 tablet (5 mg total) by mouth every 4 (four) hours as needed for severe pain. 6  tablet 0   pantoprazole (PROTONIX) 40 MG tablet TAKE 1 TABLET(40 MG) BY MOUTH DAILY 90 tablet 3   Polyethyl Glycol-Propyl Glycol (SYSTANE OP) Place 1 drop into both eyes daily as needed (dry eyes).     predniSONE (DELTASONE) 20 MG tablet Take 2 tablets (40 mg total) by mouth daily. 10 tablet 0   rosuvastatin (CRESTOR) 20 MG tablet Take 1 tablet (20 mg total) by mouth daily. 90 tablet 0   triamcinolone cream (KENALOG)  0.1 % Apply 1 application topically 2 (two) times daily as needed (eczema). 80 g 3   valACYclovir (VALTREX) 500 MG tablet Take 1 tablet (500 mg total) by mouth 2 (two) times daily as needed (fever blisters). 6 tablet 3   Zoster Vaccine Adjuvanted [facility]) injection Inject into the muscle. 0.5 mL 0   No current facility-administered medications on file prior to visit.   Allergies  Allergen Reactions   Bee Venom Anaphylaxis   Aspirin Hives, Itching and Swelling   Metoclopramide Hives   Codeine     Increases liver enzymes    Eql Antibacterial Hand Soap [Benzalkonium Chloride] Hives   Nsaids     Hallucinations    Social History   Socioeconomic History   Marital status: Married    Spouse name: Charles   Number of children: 3   Years of education: Not on file   Highest education level: Not on file  Occupational History   Occupation: Disabled  Tobacco Use   Smoking status: Former   Smokeless tobacco: Never  Scientific laboratory technician Use: Never used  Substance and Sexual Activity   Alcohol use: No   Drug use: No   Sexual activity: Yes    Birth control/protection: Surgical  Other Topics Concern   Not on file  Social History Narrative   Lives with husband and children.    Social Determinants of Health   Financial Resource Strain: Low Risk  (02/08/2021)   Overall Financial Resource Strain (CARDIA)    Difficulty of Paying Living Expenses: Not hard at all  Food Insecurity: No Food Insecurity (02/08/2021)   Hunger Vital Sign    Worried About Running Out of Food in the Last Year: Never true    Ran Out of Food in the Last Year: Never true  Transportation Needs: No Transportation Needs (02/08/2021)   PRAPARE - Hydrologist (Medical): No    Lack of Transportation (Non-Medical): No  Physical Activity: Sufficiently Active (02/08/2021)   Exercise Vital Sign    Days of Exercise per Week: 5 days    Minutes of Exercise per Session: 60 min  Stress: No Stress  Concern Present (02/08/2021)   [place]    Feeling of Stress : Not at all  Social Connections: Moderately Integrated (02/08/2021)   Social Connection and Isolation Panel [NHANES]    Frequency of Communication with Friends and Family: More than three times a week    Frequency of Social Gatherings with Friends and Family: More than three times a week    Attends Religious Services: Never    Marine scientist or Organizations: Yes    Attends Archivist Meetings: Never    Marital Status: Married  Human resources officer Violence: Not At Risk (02/08/2021)   Humiliation, Afraid, Rape, and Kick questionnaire    Fear of Current or Ex-Partner: No    Emotionally Abused: No    Physically Abused: No    Sexually  Abused: No   Family History  Problem Relation Age of Onset   Colitis Other    Crohn's disease Other    Breast cancer Other    Ovarian cancer Other    Celiac disease Other    Clotting disorder Other    Cystic fibrosis Other        pat. cousin   Diabetes Mother    Heart disease Other    Irritable bowel syndrome Other    Kidney failure Other 67       mat. nephew   Diabetes Maternal Aunt    Diabetes Son    Diabetes Other        mat. nephew   Colon cancer Neg Hx       Review of Systems  All other systems reviewed and are negative.      Objective:   Physical Exam Vitals reviewed.  Constitutional:      General: She is not in acute distress.    Appearance: She is well-developed. She is not diaphoretic.  HENT:     Head: Normocephalic and atraumatic.     Right Ear: External ear normal.     Left Ear: External ear normal.     Nose: Nose normal.     Mouth/Throat:     Pharynx: No oropharyngeal exudate.  Eyes:     General: No scleral icterus.       Right eye: No discharge.        Left eye: No discharge.     Conjunctiva/sclera: Conjunctivae normal.     Pupils: Pupils are equal, round, and reactive to  light.  Neck:     Thyroid: No thyromegaly.     Vascular: No JVD.  Cardiovascular:     Rate and Rhythm: Normal rate and regular rhythm.     Heart sounds: Normal heart sounds. No murmur heard.    No friction rub. No gallop.  Pulmonary:     Effort: Pulmonary effort is normal. No respiratory distress.     Breath sounds: Normal breath sounds. No stridor. No wheezing or rales.  Chest:     Chest wall: No tenderness.  Abdominal:     General: Bowel sounds are normal. There is no distension.     Palpations: Abdomen is soft. There is no mass.     Tenderness: There is no abdominal tenderness. There is no guarding or rebound.  Musculoskeletal:        General: No tenderness. Normal range of motion.     Cervical back: Normal range of motion and neck supple.  Lymphadenopathy:     Cervical: No cervical adenopathy.  Skin:    General: Skin is warm.     Coloration: Skin is not pale.     Findings: No erythema or rash.  Neurological:     Mental Status: She is alert and oriented to person, place, and time.     Cranial Nerves: No cranial nerve deficit.     Motor: No abnormal muscle tone.     Coordination: Coordination normal.     Deep Tendon Reflexes: Reflexes are normal and symmetric.  Psychiatric:        Behavior: Behavior normal.        Thought Content: Thought content normal.        Judgment: Judgment normal.           Assessment & Plan:  Encounter for Medicare annual wellness exam  Dyslipidemia associated with type 2 diabetes mellitus ([place])  Essential hypertension, benign  Type  2 diabetes mellitus with diabetic autonomic neuropathy, without long-term current use of insulin (HCC)  NAFLD (nonalcoholic fatty liver disease) - Plan: Liver Fibrosis, Hepatic Function Panel with Fibrosis-4 (FIB-4) Index We had a long discussion about her nonalcoholic fatty liver disease.  I emphasized the need for weight loss.  Therefore we will discontinue Trulicity and switch to Ozempic 1 mg subcu weekly  as I believe this is superior for weight loss.  If she is tolerating this we will up the dose is high as she can tolerate to the maximum dose on a monthly basis.  I will also check a fib-4 and if elevated add Actos 30 mg a day to help treat her underlying fatty liver disease.  Strongly encouraged exercise and weight loss.  Patient will schedule her mammogram.  Her colonoscopy is up-to-date.  Immunizations are up-to-date.

## 2022-04-18 NOTE — Telephone Encounter (Signed)
Pls call pt PT NEED annual APPT W/PCP FOR FUTURE REFILLS

## 2022-04-18 NOTE — Telephone Encounter (Signed)
Patient had a cpe today.

## 2022-04-19 LAB — LIVER FIBROSIS,HEPATIC FUNCTION PANEL W/FIBROSIS-4(FIB-4)INDEX
AG Ratio: 1.7 (calc) (ref 1.0–2.5)
ALT: 37 U/L — ABNORMAL HIGH (ref 6–29)
AST: 49 U/L — ABNORMAL HIGH (ref 10–35)
Albumin: 4.7 g/dL (ref 3.6–5.1)
Alkaline phosphatase (APISO): 88 U/L (ref 37–153)
Bilirubin, Direct: 0 mg/dL (ref 0.0–0.2)
FIB 4 INDEX: 1.17
Globulin: 2.8 g/dL (calc) (ref 1.9–3.7)
Indirect Bilirubin: 0.2 mg/dL (calc) (ref 0.2–1.2)
Platelets: 365 10*3/uL (ref 140–400)
Total Bilirubin: 0.2 mg/dL (ref 0.2–1.2)
Total Protein: 7.5 g/dL (ref 6.1–8.1)

## 2022-04-22 ENCOUNTER — Other Ambulatory Visit: Payer: Self-pay | Admitting: Family Medicine

## 2022-05-15 ENCOUNTER — Telehealth: Payer: Self-pay | Admitting: Pharmacist

## 2022-05-15 NOTE — Progress Notes (Unsigned)
Reason for Encounter: Medication coordination and delivery  Contacted patient on 05/15/2022 to discuss medications   Recent office visits:  04/18/2022 OV (PCP) Jacqueline Frizzle, MD;  we will discontinue Trulicity and switch to Ozempic 1 mg subcu weekly as I believe this is superior for weight loss.  If she is tolerating this we will up the dose is high as she can tolerate to the maximum dose on a monthly basis.  I will also check a fib-4 and if elevated add Actos 30 mg a day to help treat her underlying fatty liver disease.  Strongly encouraged exercise and weight loss.   Recent consult visits:  None  Hospital visits:  None in previous 6 months  Medications: Outpatient Encounter Medications as of 05/15/2022  Medication Sig   aspirin EC 81 MG tablet Take 81 mg by mouth daily.   Blood Glucose Monitoring Suppl (BLOOD GLUCOSE SYSTEM PAK) KIT Please dispense based on patient and insurance preference. Use as directed to monitor FSBS 2x daily. Dx: E11.9.   cetirizine (ZYRTEC) 10 MG tablet Take 10 mg by mouth daily.   COVID-19 mRNA vaccine 2023-2024 (COMIRNATY) syringe Inject into the muscle.   DULoxetine (CYMBALTA) 60 MG capsule TAKE TWO CAPSULES BY MOUTH ONCE DAILY   EPINEPHrine 0.3 mg/0.3 mL IJ SOAJ injection Inject 0.3 mg into the muscle as needed for anaphylaxis.   Flaxseed, Linseed, (FLAXSEED OIL PO) Take 1 capsule by mouth daily.   fluticasone (FLONASE) 50 MCG/ACT nasal spray shake liquid AND USE TWO SPRAYS in each nostril DAILY   furosemide (LASIX) 20 MG tablet TAKE ONE TABLET BY MOUTH TWICE DAILY   gabapentin (NEURONTIN) 600 MG tablet take ONE-HALF TO TWO TABLETS BY MOUTH THREE TIMES DAILY AS NEEDED   glucose blood (ACCU-CHEK GUIDE) test strip USE AS DIRECTED TWICE DAILY TO check blood sugar   influenza vac split quadrivalent PF (FLUARIX) 0.5 ML injection Inject into the muscle.   Lancets (ONETOUCH DELICA PLUS [id number]) MISC 1 each by Does not apply route 2 (two) times daily.   metFORMIN  (GLUCOPHAGE) 1000 MG tablet TAKE ONE TABLET BY MOUTH TWICE DAILY WITH A MEAL   metoprolol succinate (TOPROL-XL) 50 MG 24 hr tablet TAKE ONE TABLET BY MOUTH ONCE DAILY with OR immediately following A meal   oxcarbazepine (TRILEPTAL) 600 MG tablet TAKE ONE TABLET BY MOUTH ONCE DAILY   oxyCODONE (ROXICODONE) 5 MG immediate release tablet Take 1 tablet (5 mg total) by mouth every 4 (four) hours as needed for severe pain.   pantoprazole (PROTONIX) 40 MG tablet TAKE ONE TABLET BY MOUTH ONCE DAILY   Polyethyl Glycol-Propyl Glycol (SYSTANE OP) Place 1 drop into both eyes daily as needed (dry eyes).   predniSONE (DELTASONE) 20 MG tablet Take 2 tablets (40 mg total) by mouth daily.   rosuvastatin (CRESTOR) 20 MG tablet TAKE ONE TABLET BY MOUTH ONCE DAILY   Semaglutide, 1 MG/DOSE, 4 MG/3ML SOPN Inject 1 mg as directed once a week.   triamcinolone cream (KENALOG) 0.1 % APPLY TO THE AFFECTED AREA(S) TWICE DAILY as needed FOR eczema   valACYclovir (VALTREX) 500 MG tablet Take 1 tablet (500 mg total) by mouth 2 (two) times daily as needed (fever blisters).   Zoster Vaccine Adjuvanted [employer]) injection Inject into the muscle.   No facility-administered encounter medications on file as of 05/15/2022.   BP Readings from Last 3 Encounters:  04/18/22 118/76  02/07/22 (!) 140/72  12/10/21 130/80    Pulse Readings from Last 3 Encounters:  04/18/22 95  02/07/22 76  12/10/21 72    Lab Results  Component Value Date/Time   HGBA1C 7.0 (H) 04/10/2022 09:08 AM   HGBA1C 6.7 (H) 03/26/2021 08:43 AM   Lab Results  Component Value Date   CREATININE 0.72 04/10/2022   BUN 13 04/10/2022   GFRNONAA >60 07/12/2020   GFRAA 114 02/28/2020   NA 142 04/10/2022   K 4.5 04/10/2022   CALCIUM 9.4 04/10/2022   CO2 27 04/10/2022     Last adherence delivery date:04/25/2022      Patient is due for next adherence delivery on: 05/27/2022  {Med Review:25223}  This delivery to include: {Packaging choices:23805}  {Day  Supply:23806}  Duloxetine 60 mg twice daily Metoprolol Succinate 50 mg once daily Metformin 1000 mg twice daily Accuchek Test Strips use twice daily OneTouch Delica Lancets Furosemide 20 mg twice daily Rosuvastatin 20 mg once daily Oxcarbazepine 600 mg once daily Gabapentin 600 mg .5-2 tablets three times daily as needed Pantoprazole 40 mg once daily Trulicity once a week Triamcinolone 0.1% cream   Patient declined the following medications this month: ***  {refills needed:25320}  {Delivery PJSR:15945}   Any concerns about your medications? {yes/no:20286}  How often do you forget or accidentally miss a dose? {Missed doses:25554}  Do you use a pillbox? {yes/no:20286}  Is patient in packaging {yes/no:20286}  If yes  What is the date on your next pill pack?  Any concerns or issues with your packaging?   Recent blood pressure readings are as follows:***  Recent blood glucose readings are as follows:***   Cycle dispensing form sent to *** for review.   [name], [facility] Pharmacist Assistant [phone]

## 2022-05-16 ENCOUNTER — Other Ambulatory Visit: Payer: Self-pay | Admitting: Family Medicine

## 2022-05-16 ENCOUNTER — Other Ambulatory Visit: Payer: Self-pay

## 2022-05-16 DIAGNOSIS — K219 Gastro-esophageal reflux disease without esophagitis: Secondary | ICD-10-CM

## 2022-05-16 DIAGNOSIS — E1143 Type 2 diabetes mellitus with diabetic autonomic (poly)neuropathy: Secondary | ICD-10-CM

## 2022-05-16 DIAGNOSIS — E0842 Diabetes mellitus due to underlying condition with diabetic polyneuropathy: Secondary | ICD-10-CM

## 2022-05-16 DIAGNOSIS — K760 Fatty (change of) liver, not elsewhere classified: Secondary | ICD-10-CM

## 2022-05-16 MED ORDER — BLOOD GLUCOSE MONITOR KIT: PACK | 0 refills | Status: DC

## 2022-05-16 MED ORDER — DULOXETINE HCL 60 MG PO CPEP: ORAL_CAPSULE | ORAL | 3 refills | Status: DC

## 2022-05-16 MED ORDER — PANTOPRAZOLE SODIUM 40 MG PO TBEC: DELAYED_RELEASE_TABLET | ORAL | 3 refills | Status: DC

## 2022-05-16 NOTE — Telephone Encounter (Signed)
Requested Prescriptions  Pending Prescriptions Disp Refills   pantoprazole (PROTONIX) 40 MG tablet [Pharmacy Med Name: pantoprazole 40 mg tablet,delayed release] 30 tablet     Sig: TAKE ONE TABLET BY MOUTH ONCE DAILY     Gastroenterology: Proton Pump Inhibitors Failed - 05/16/2022  1:57 PM      Failed - Valid encounter within last 12 months    Recent Outpatient Visits           1 year ago Encounter for Medicare annual wellness exam   [place] Susy Frizzle, MD   1 year ago [place] Susy Frizzle, MD   1 year ago RLQ abdominal pain   [place] Susy Frizzle, MD   2 years ago Cough   [place] Susy Frizzle, MD   2 years ago Upper respiratory tract infection, unspecified type   [place] Eulogio Bear, NP               DULoxetine (CYMBALTA) 60 MG capsule [Pharmacy Med Name: duloxetine 60 mg capsule,delayed release] 60 capsule     Sig: TAKE TWO CAPSULES BY MOUTH ONCE DAILY     Psychiatry: Antidepressants - SNRI - duloxetine Failed - 05/16/2022  1:57 PM      Failed - Valid encounter within last 6 months    Recent Outpatient Visits           1 year ago Encounter for Medicare annual wellness exam   [place] Susy Frizzle, MD   1 year ago [place] Susy Frizzle, MD   1 year ago RLQ abdominal pain   [place] Susy Frizzle, MD   2 years ago Cough   [place] Susy Frizzle, MD   2 years ago Upper respiratory tract infection, unspecified type   [place] Eulogio Bear, NP              Passed - Cr in normal range and within 360 days    Creat  Date Value Ref Range Status  04/10/2022 0.72 0.50 - 1.03 mg/dL Final   Creatinine, Urine  Date Value Ref Range Status  04/10/2022 156 20 - 275 mg/dL Final         Passed - eGFR  is 30 or above and within 360 days    GFR, Est African American  Date Value Ref Range Status  02/28/2020 114 > OR = 60 mL/min/1.26m Final   GFR, Est Non African American  Date Value Ref Range Status  02/28/2020 99 > OR = 60 mL/min/1.734mFinal   GFR, Estimated  Date Value Ref Range Status  07/12/2020 >60 >60 mL/min Final    Comment:    (NOTE) Calculated using the CKD-EPI Creatinine Equation (2021)    eGFR  Date Value Ref Range Status  04/10/2022 100 > OR = 60 mL/min/1.7346minal         Passed - Completed PHQ-2 or PHQ-9 in the last 360 days      Passed - Last BP in normal range    BP Readings from Last 1 Encounters:  04/18/22 118/76          triamcinolone cream (KENALOG) 0.1 % [Pharmacy Med Name: triamcinolone acetonide 0.1 % topical cream] 80 g     Sig: APPLY TO THE AFFECTED AREA(S) TWICE DAILY as needed  FOR eczema     Not Delegated - Dermatology:  Corticosteroids Failed - 05/16/2022  1:57 PM      Failed - This refill cannot be delegated      Failed - Valid encounter within last 12 months    Recent Outpatient Visits           1 year ago Encounter for Medicare annual wellness exam   [place] Dennard Schaumann, Cammie Mcgee, MD   1 year ago [place], Warren T, MD   1 year ago RLQ abdominal pain   [place] Dennard Schaumann Cammie Mcgee, MD   2 years ago Cough   [place] Dennard Schaumann Cammie Mcgee, MD   2 years ago Upper respiratory tract infection, unspecified type   [place] Eulogio Bear, NP

## 2022-05-16 NOTE — Telephone Encounter (Signed)
Requested medication (s) are due for refill today: yes  Requested medication (s) are on the active medication list: yes  Last refill:  04/18/22 80 g  Future visit scheduled: no  Notes to clinic:  med not delegated to NT to RF or refuse   Requested Prescriptions  Pending Prescriptions Disp Refills   triamcinolone cream (KENALOG) 0.1 % [Pharmacy Med Name: triamcinolone acetonide 0.1 % topical cream] 80 g     Sig: APPLY TO THE AFFECTED AREA(S) TWICE DAILY as needed FOR eczema     Not Delegated - Dermatology:  Corticosteroids Failed - 05/16/2022  1:57 PM      Failed - This refill cannot be delegated      Failed - Valid encounter within last 12 months    Recent Outpatient Visits           1 year ago Encounter for Medicare annual wellness exam   [place] Susy Frizzle, MD   1 year ago [place] Dennard Schaumann, Cammie Mcgee, MD   1 year ago RLQ abdominal pain   [place] Dennard Schaumann, Cammie Mcgee, MD   2 years ago Cough   [place] Dennard Schaumann, Cammie Mcgee, MD   2 years ago Upper respiratory tract infection, unspecified type   [facility] Eulogio Bear, NP              Refused Prescriptions Disp Refills   pantoprazole (PROTONIX) 40 MG tablet [Pharmacy Med Name: pantoprazole 40 mg tablet,delayed release] 30 tablet     Sig: TAKE ONE TABLET BY MOUTH ONCE DAILY     Gastroenterology: Proton Pump Inhibitors Failed - 05/16/2022  1:57 PM      Failed - Valid encounter within last 12 months    Recent Outpatient Visits           1 year ago Encounter for Medicare annual wellness exam   [place] Pick[place], Cammie Mcgee, MD   1 year ago [employer] [place] Dennard Schaumann, Cammie Mcgee, MD   1 year ago RLQ abdominal pain   [place] Dennard Schaumann, Cammie Mcgee, MD   2 years ago Cough   [place] Dennard Schaumann, Cammie Mcgee, MD   2 years ago Upper respiratory  tract infection, unspecified type   [place] Eulogio Bear, NP               DULoxetine (CYMBALTA) 60 MG capsule [Pharmacy Med Name: duloxetine 60 mg capsule,delayed release] 60 capsule     Sig: TAKE TWO CAPSULES BY MOUTH ONCE DAILY     Psychiatry: Antidepressants - SNRI - duloxetine Failed - 05/16/2022  1:57 PM      Failed - Valid encounter within last 6 months    Recent Outpatient Visits           1 year ago Encounter for Medicare annual wellness exam   [place] [facility] Dennard Schaumann, Cammie Mcgee, MD   1 year ago [employer] [place], Warren T, MD   1 year ago RLQ abdominal pain   [place] Dennard Schaumann, Cammie Mcgee, MD   2 years ago Cough   [place] Susy Frizzle, MD   2 years ago Upper respiratory tract infection, unspecified type   [place] Eulogio Bear, NP  Passed - Cr in normal range and within 360 days    Creat  Date Value Ref Range Status  04/10/2022 0.72 0.50 - 1.03 mg/dL Final   Creatinine, Urine  Date Value Ref Range Status  04/10/2022 156 20 - 275 mg/dL Final         Passed - eGFR is 30 or above and within 360 days    GFR, Est African American  Date Value Ref Range Status  02/28/2020 114 > OR = 60 mL/min/1.34m Final   GFR, Est Non African American  Date Value Ref Range Status  02/28/2020 99 > OR = 60 mL/min/1.779mFinal   GFR, Estimated  Date Value Ref Range Status  07/12/2020 >60 >60 mL/min Final    Comment:    (NOTE) Calculated using the CKD-EPI Creatinine Equation (2021)    eGFR  Date Value Ref Range Status  04/10/2022 100 > OR = 60 mL/min/1.7317minal         Passed - Completed PHQ-2 or PHQ-9 in the last 360 days      Passed - Last BP in normal range    BP Readings from Last 1 Encounters:  04/18/22 118/76

## 2022-05-17 DIAGNOSIS — G932 Benign intracranial hypertension: Secondary | ICD-10-CM | POA: Diagnosis not present

## 2022-05-17 DIAGNOSIS — I10 Essential (primary) hypertension: Secondary | ICD-10-CM | POA: Diagnosis not present

## 2022-05-17 DIAGNOSIS — H2511 Age-related nuclear cataract, right eye: Secondary | ICD-10-CM | POA: Diagnosis not present

## 2022-05-17 DIAGNOSIS — H2513 Age-related nuclear cataract, bilateral: Secondary | ICD-10-CM | POA: Diagnosis not present

## 2022-05-22 ENCOUNTER — Telehealth: Payer: Self-pay

## 2022-05-22 ENCOUNTER — Ambulatory Visit (INDEPENDENT_AMBULATORY_CARE_PROVIDER_SITE_OTHER): Payer: Medicare Other

## 2022-05-22 VITALS — Ht 62.0 in | Wt 185.0 lb

## 2022-05-22 DIAGNOSIS — Z0000 Encounter for general adult medical examination without abnormal findings: Secondary | ICD-10-CM

## 2022-05-22 NOTE — Patient Instructions (Signed)
[name] , Thank you for taking time to come for your Medicare Wellness Visit. I appreciate your ongoing commitment to your health goals. Please review the following plan we discussed and let me know if I can assist you in the future.   These are the goals we discussed:  Goals      DIET - INCREASE WATER INTAKE     Pt would like to lose weight. Would like to figure out what is at the core of her health issues are.      Exercise 150 min/wk Moderate Activity     Continue to exercise and work on weight loss.      Pharmacy Care Plan:     CARE PLAN ENTRY (see longitudinal plan of care for additional care plan information)  Current Barriers:  Chronic Disease Management support, education, and care coordination needs related to Hypertension, Hyperlipidemia, and Diabetes w/neuropathy   Hypertension BP Readings from Last 3 Encounters:  11/16/19 120/90  02/26/19 140/90  04/24/18 120/90  Pharmacist Clinical Goal(s): Over the next 180 days, patient will work with PharmD and providers to maintain BP goal <130/80 Current regimen:  Metoprolol XL '50mg'$  daily Interventions: Reviewed home BP monitoring Discussed recent office BP Encouraged continued physical exercise Patient self care activities - Over the next 180 days, patient will: Check BP periodically, document, and provide at future appointments Ensure daily salt intake < 2300 mg/day  Hyperlipidemia Lab Results  Component Value Date/Time   LDLCALC 104 (H) 11/16/2019 09:36 AM  Pharmacist Clinical Goal(s): Over the next 180 days, patient will work with PharmD and providers to achieve LDL goal < 100 Current regimen:  Rosuvastatin '20mg'$  Interventions: Reviewed most recent lipid panel Discussed medication adherence Patient self care activities - Over the next 180 days, patient will: Continue to focus on medication adherence Work on dietary modifications and physical activity  Diabetes Lab Results  Component Value Date/Time    HGBA1C 6.6 (H) 11/16/2019 09:36 AM   HGBA1C 6.6 (H) 02/26/2019 10:51 AM  Pharmacist Clinical Goal(s): Over the next 180 days, patient will work with PharmD and providers to maintain A1c goal <7% Current regimen:  Trulicity 1.'5mg'$  once weekly Metformin '1000mg'$  twice daily with meals Gabapentin '600mg'$  three times per day as needed Duloxetine 60 mg two tablets daily Oxcarbazepine '600mg'$  daily Interventions: Reviewed home blood sugar monitoring Discussed diet and exercise Reviewed most recent A1c Patient self care activities - Over the next 180 days, patient will: Check blood sugar once daily, document, and provide at future appointments Contact provider with any episodes of hypoglycemia Contact PharmD with any cost concerns in the future.   Initial goal documentation         This is a list of the screening recommended for you and due dates:  Health Maintenance  Topic Date Due   Complete foot exam   08/09/2022*   Mammogram  08/25/2022   Hemoglobin A1C  10/09/2022   Eye exam for diabetics  03/28/2023   Yearly kidney function blood test for diabetes  04/11/2023   Yearly kidney health urinalysis for diabetes  04/11/2023   Medicare Annual Wellness Visit  05/23/2023   Colon Cancer Screening  03/29/2030   DTaP/Tdap/Td vaccine (3 - Td or Tdap) 02/08/2032   Flu Shot  Completed   COVID-19 Vaccine  Completed   Hepatitis C Screening: USPSTF Recommendation to screen - Ages 18-79 yo.  Completed   Zoster (Shingles) Vaccine  Completed   HPV Vaccine  Aged Out   Pap Smear  Discontinued   HIV Screening  Discontinued  *Topic was postponed. The date shown is not the original due date.    Advanced directives: Please bring a copy of your health care power of attorney and living will to the office to be added to your chart at your convenience.   Conditions/risks identified: Aim for 30 minutes of exercise or brisk walking, 6-8 glasses of water, and 5 servings of fruits and vegetables each day.    Next appointment: Follow up in one year for your annual wellness visit. 05/2023.  Preventive Care 40-64 Years, Female Preventive care refers to lifestyle choices and visits with your health care provider that can promote health and wellness. What does preventive care include? A yearly physical exam. This is also called an annual well check. Dental exams once or twice a year. Routine eye exams. Ask your health care provider how often you should have your eyes checked. Personal lifestyle choices, including: Daily care of your teeth and gums. Regular physical activity. Eating a healthy diet. Avoiding tobacco and drug use. Limiting alcohol use. Practicing safe sex. Taking low-dose aspirin daily starting at age 70. Taking vitamin and mineral supplements as recommended by your health care provider. What happens during an annual well check? The services and screenings done by your health care provider during your annual well check will depend on your age, overall health, lifestyle risk factors, and family history of disease. Counseling  Your health care provider may ask you questions about your: Alcohol use. Tobacco use. Drug use. Emotional well-being. Home and relationship well-being. Sexual activity. Eating habits. Work and work Statistician. Method of birth control. Menstrual cycle. Pregnancy history. Screening  You may have the following tests or measurements: Height, weight, and BMI. Blood pressure. Lipid and cholesterol levels. These may be checked every 5 years, or more frequently if you are over 67 years old. Skin check. Lung cancer screening. You may have this screening every year starting at age 16 if you have a 30-pack-year history of smoking and currently smoke or have quit within the past 15 years. Fecal occult blood test (FOBT) of the stool. You may have this test every year starting at age 75. Flexible sigmoidoscopy or colonoscopy. You may have a sigmoidoscopy every  5 years or a colonoscopy every 10 years starting at age 62. Hepatitis C blood test. Hepatitis B blood test. Sexually transmitted disease (STD) testing. Diabetes screening. This is done by checking your blood sugar (glucose) after you have not eaten for a while (fasting). You may have this done every 1-3 years. Mammogram. This may be done every 1-2 years. Talk to your health care provider about when you should start having regular mammograms. This may depend on whether you have a family history of breast cancer. BRCA-related cancer screening. This may be done if you have a family history of breast, ovarian, tubal, or peritoneal cancers. Pelvic exam and Pap test. This may be done every 3 years starting at age 21. Starting at age 79, this may be done every 5 years if you have a Pap test in combination with an HPV test. Bone density scan. This is done to screen for osteoporosis. You may have this scan if you are at high risk for osteoporosis. Discuss your test results, treatment options, and if necessary, the need for more tests with your health care provider. Vaccines  Your health care provider may recommend certain vaccines, such as: Influenza vaccine. This is recommended every year. Tetanus, diphtheria, and acellular pertussis (Tdap,  Td) vaccine. You may need a Td booster every 10 years. Zoster vaccine. You may need this after age 81. Pneumococcal 13-valent conjugate (PCV13) vaccine. You may need this if you have certain conditions and were not previously vaccinated. Pneumococcal polysaccharide (PPSV23) vaccine. You may need one or two doses if you smoke cigarettes or if you have certain conditions. Talk to your health care provider about which screenings and vaccines you need and how often you need them. This information is not intended to replace advice given to you by your health care provider. Make sure you discuss any questions you have with your health care provider. Document Released:  05/26/2015 Document Revised: 01/17/2016 Document Reviewed: 02/28/2015 Elsevier Interactive Patient Education  2017 [place] Prevention in the Home Falls can cause injuries. They can happen to people of all ages. There are many things you can do to make your home safe and to help prevent falls. What can I do on the outside of my home? Regularly fix the edges of walkways and driveways and fix any cracks. Remove anything that might make you trip as you walk through a door, such as a raised step or threshold. Trim any bushes or trees on the path to your home. Use bright outdoor lighting. Clear any walking paths of anything that might make someone trip, such as rocks or tools. Regularly check to see if handrails are loose or broken. Make sure that both sides of any steps have handrails. Any raised decks and porches should have guardrails on the edges. Have any leaves, snow, or ice cleared regularly. Use sand or salt on walking paths during winter. Clean up any spills in your garage right away. This includes oil or grease spills. What can I do in the bathroom? Use night lights. Install grab bars by the toilet and in the tub and shower. Do not use towel bars as grab bars. Use non-skid mats or decals in the tub or shower. If you need to sit down in the shower, use a plastic, non-slip stool. Keep the floor dry. Clean up any water that spills on the floor as soon as it happens. Remove soap buildup in the tub or shower regularly. Attach bath mats securely with double-sided non-slip rug tape. Do not have throw rugs and other things on the floor that can make you trip. What can I do in the bedroom? Use night lights. Make sure that you have a light by your bed that is easy to reach. Do not use any sheets or blankets that are too big for your bed. They should not hang down onto the floor. Have a firm chair that has side arms. You can use this for support while you get dressed. Do not  have throw rugs and other things on the floor that can make you trip. What can I do in the kitchen? Clean up any spills right away. Avoid walking on wet floors. Keep items that you use a lot in easy-to-reach places. If you need to reach something above you, use a strong step stool that has a grab bar. Keep electrical cords out of the way. Do not use floor polish or wax that makes floors slippery. If you must use wax, use non-skid floor wax. Do not have throw rugs and other things on the floor that can make you trip. What can I do with my stairs? Do not leave any items on the stairs. Make sure that there are handrails on both sides  of the stairs and use them. Fix handrails that are broken or loose. Make sure that handrails are as long as the stairways. Check any carpeting to make sure that it is firmly attached to the stairs. Fix any carpet that is loose or worn. Avoid having throw rugs at the top or bottom of the stairs. If you do have throw rugs, attach them to the floor with carpet tape. Make sure that you have a light switch at the top of the stairs and the bottom of the stairs. If you do not have them, ask someone to add them for you. What else can I do to help prevent falls? Wear shoes that: Do not have high heels. Have rubber bottoms. Are comfortable and fit you well. Are closed at the toe. Do not wear sandals. If you use a stepladder: Make sure that it is fully opened. Do not climb a closed stepladder. Make sure that both sides of the stepladder are locked into place. Ask someone to hold it for you, if possible. Clearly mark and make sure that you can see: Any grab bars or handrails. First and last steps. Where the edge of each step is. Use tools that help you move around (mobility aids) if they are needed. These include: Canes. Walkers. Scooters. Crutches. Turn on the lights when you go into a dark area. Replace any light bulbs as soon as they burn out. Set up your  furniture so you have a clear path. Avoid moving your furniture around. If any of your floors are uneven, fix them. If there are any pets around you, be aware of where they are. Review your medicines with your doctor. Some medicines can make you feel dizzy. This can increase your chance of falling. Ask your doctor what other things that you can do to help prevent falls. This information is not intended to replace advice given to you by your health care provider. Make sure you discuss any questions you have with your health care provider. Document Released: 02/23/2009 Document Revised: 10/05/2015 Document Reviewed: 06/03/2014 Elsevier Interactive Patient Education  2017 [employer].

## 2022-05-22 NOTE — Progress Notes (Addendum)
Subjective:   Jacqueline [name] is a 57 y.o. female who presents for Medicare Annual (Subsequent) preventive examination. Virtual Visit via Telephone Note  I connected with  Jacqueline [name] on 05/22/22 at  8:30 AM EST by telephone and verified that I am speaking with the correct person using two identifiers.  Location: Patient: HOME Provider: [facility] Persons participating in the virtual visit: patient/Nurse Health Advisor   I discussed the limitations, risks, security and privacy concerns of performing an evaluation and management service by telephone and the availability of in person appointments. The patient expressed understanding and agreed to proceed.  Interactive audio and video telecommunications were attempted between this nurse and patient, however failed, due to patient having technical difficulties OR patient did not have access to video capability.  We continued and completed visit with audio only.  Some vital signs may be absent or patient reported.   Chriss Driver, LPN  Review of Systems     Cardiac Risk Factors include: advanced age (>38mn, >>68women);diabetes mellitus;hypertension;obesity (BMI >30kg/m2);sedentary lifestyle     Objective:    Today's Vitals   05/22/22 0833  Weight: 185 lb (83.9 kg)  Height: '5\' 2"'$  (1.575 m)   Body mass index is 33.84 kg/m.     05/22/2022    8:40 AM 12/10/2021    2:19 PM 02/08/2021    2:11 PM 07/12/2020    7:59 AM 07/11/2020    4:57 PM 03/29/2020    7:47 AM  Advanced Directives  Does Patient Have a Medical Advance Directive? Yes No No No No No  Type of AParamedicof AMeadeLiving will       Copy of [place] Chart? No - copy requested       Would patient like information on creating a medical advance directive?  No - Patient declined No - Patient declined   No - Patient declined    Current Medications (verified) Outpatient Encounter Medications as of 05/22/2022  Medication Sig    aspirin EC 81 MG tablet Take 81 mg by mouth daily.   blood glucose meter kit and supplies KIT Dispense based on patient and insurance preference. Use up to four times daily as directed.   Blood Glucose Monitoring Suppl (BLOOD GLUCOSE SYSTEM PAK) KIT Please dispense based on patient and insurance preference. Use as directed to monitor FSBS 2x daily. Dx: E11.9.   cetirizine (ZYRTEC) 10 MG tablet Take 10 mg by mouth daily.   COVID-19 mRNA vaccine 2023-2024 (COMIRNATY) syringe Inject into the muscle.   DULoxetine (CYMBALTA) 60 MG capsule TAKE TWO CAPSULES BY MOUTH ONCE DAILY   EPINEPHrine 0.3 mg/0.3 mL IJ SOAJ injection Inject 0.3 mg into the muscle as needed for anaphylaxis.   Flaxseed, Linseed, (FLAXSEED OIL PO) Take 1 capsule by mouth daily.   fluticasone (FLONASE) 50 MCG/ACT nasal spray shake liquid AND USE TWO SPRAYS in each nostril DAILY   furosemide (LASIX) 20 MG tablet TAKE ONE TABLET BY MOUTH TWICE DAILY   gabapentin (NEURONTIN) 600 MG tablet take ONE-HALF TO TWO TABLETS BY MOUTH THREE TIMES DAILY AS NEEDED   glucose blood (ACCU-CHEK GUIDE) test strip USE AS DIRECTED TWICE DAILY TO check blood sugar   influenza vac split quadrivalent PF (FLUARIX) 0.5 ML injection Inject into the muscle.   Lancets (ONETOUCH DELICA PLUS [id number] MISC 1 each by Does not apply route 2 (two) times daily.   metFORMIN (GLUCOPHAGE) 1000 MG tablet TAKE ONE TABLET BY MOUTH TWICE  DAILY WITH A MEAL   metoprolol succinate (TOPROL-XL) 50 MG 24 hr tablet TAKE ONE TABLET BY MOUTH ONCE DAILY with OR immediately following A meal   oxcarbazepine (TRILEPTAL) 600 MG tablet TAKE ONE TABLET BY MOUTH ONCE DAILY   oxyCODONE (ROXICODONE) 5 MG immediate release tablet Take 1 tablet (5 mg total) by mouth every 4 (four) hours as needed for severe pain.   pantoprazole (PROTONIX) 40 MG tablet TAKE ONE TABLET BY MOUTH ONCE DAILY   Polyethyl Glycol-Propyl Glycol (SYSTANE OP) Place 1 drop into both eyes daily as needed (dry eyes).    rosuvastatin (CRESTOR) 20 MG tablet TAKE ONE TABLET BY MOUTH ONCE DAILY   Semaglutide, 1 MG/DOSE, 4 MG/3ML SOPN Inject 1 mg as directed once a week.   triamcinolone cream (KENALOG) 0.1 % APPLY TO THE AFFECTED AREA(S) TWICE DAILY as needed FOR eczema   valACYclovir (VALTREX) 500 MG tablet Take 1 tablet (500 mg total) by mouth 2 (two) times daily as needed (fever blisters).   [DISCONTINUED] predniSONE (DELTASONE) 20 MG tablet Take 2 tablets (40 mg total) by mouth daily. (Patient not taking: Reported on 05/22/2022)   [DISCONTINUED] Zoster Vaccine Adjuvanted [facility]) injection Inject into the muscle. (Patient not taking: Reported on 05/22/2022)   No facility-administered encounter medications on file as of 05/22/2022.    Allergies (verified) Bee venom, Aspirin, Metoclopramide, Codeine, Eql antibacterial hand soap [benzalkonium chloride], and Nsaids   History: Past Medical History:  Diagnosis Date   Abnormal transaminases    Allergy    Phreesia 03/01/2020   Anemia    Anxiety disorder    Arthritis    Asthma    Cancer ([place])    Phreesia 03/01/2020   Cellulitis    Chronic headaches    Diabetes mellitus without complication ([place])    Phreesia 03/01/2020   Diverticulitis    DM (diabetes mellitus) (HCC)    Erosive esophagitis    Fatty liver    GERD (gastroesophageal reflux disease)    Hyperlipidemia    Phreesia 03/01/2020   IBS (irritable bowel syndrome)    Kidney stones    Neuromuscular disorder ([place])    Phreesia 03/01/2020   Obesity    Pneumonia    Pseudotumor cerebri    Sinus tachycardia    Past Surgical History:  Procedure Laterality Date   ABDOMINAL HYSTERECTOMY N/A    Phreesia 03/01/2020   ANKLE SURGERY     COLONOSCOPY  06/19/2009   COLONOSCOPY WITH PROPOFOL N/A 03/29/2020   Procedure: COLONOSCOPY WITH PROPOFOL;  Surgeon: Harvel Quale, MD;  [facility];  Service: Gastroenterology;  Laterality: N/A;  9:00   EGD  06/09/2009   FOOT SURGERY     NASAL  SEPTUM SURGERY     ovarian cysts     x3   TONSILLECTOMY     TUBAL LIGATION     VAGINAL HYSTERECTOMY     Family History  Problem Relation Age of Onset   Colitis Other    Crohn's disease Other    Breast cancer Other    Ovarian cancer Other    Celiac disease Other    Clotting disorder Other    Cystic fibrosis Other        pat. cousin   Diabetes Mother    Heart disease Other    Irritable bowel syndrome Other    Kidney failure Other 60       mat. nephew   Diabetes Maternal Aunt    Diabetes Son    Diabetes Other  mat. nephew   Colon cancer Neg Hx    Social History   Socioeconomic History   Marital status: Married    Spouse name: Charles   Number of children: 3   Years of education: Not on file   Highest education level: Not on file  Occupational History   Occupation: Disabled  Tobacco Use   Smoking status: Former   Smokeless tobacco: Never  Scientific laboratory technician Use: Never used  Substance and Sexual Activity   Alcohol use: No   Drug use: No   Sexual activity: Yes    Birth control/protection: Surgical  Other Topics Concern   Not on file  Social History Narrative   Lives with husband and children.    Social Determinants of Health   Financial Resource Strain: Low Risk  (05/22/2022)   Overall Financial Resource Strain (CARDIA)    Difficulty of Paying Living Expenses: Not hard at all  Food Insecurity: No Food Insecurity (05/22/2022)   Hunger Vital Sign    Worried About Running Out of Food in the Last Year: Never true    Ran Out of Food in the Last Year: Never true  Transportation Needs: No Transportation Needs (05/22/2022)   PRAPARE - Hydrologist (Medical): No    Lack of Transportation (Non-Medical): No  Physical Activity: Sufficiently Active (05/22/2022)   Exercise Vital Sign    Days of Exercise per Week: 5 days    Minutes of Exercise per Session: 30 min  Stress: No Stress Concern Present (05/22/2022)   [place]    Feeling of Stress : Not at all  Social Connections: [place] (05/22/2022)   Social Connection and Isolation Panel [NHANES]    Frequency of Communication with Friends and Family: More than three times a week    Frequency of Social Gatherings with Friends and Family: More than three times a week    Attends Religious Services: More than 4 times per year    Active Member of [employer] or Organizations: Yes    Attends Music therapist: More than 4 times per year    Marital Status: Married    Tobacco Counseling Counseling given: Not Answered   Clinical Intake:  Pre-visit preparation completed: Yes  Pain : No/denies pain     BMI - recorded: 33.84 Nutritional Status: BMI > 30  Obese Diabetes: Yes CBG done?: No Did pt. bring in CBG monitor from home?: No  How often do you need to have someone help you when you read instructions, pamphlets, or other written materials from your doctor or pharmacy?: 1 - Never  Diabetic? Nutrition Risk Assessment:  Has the patient had any N/V/D within the last 2 months?  No  Does the patient have any non-healing wounds?  No  Has the patient had any unintentional weight loss or weight gain?  No   Diabetes:  Is the patient diabetic?  Yes  If diabetic, was a CBG obtained today?  No  Did the patient bring in their glucometer from home?  No  How often do you monitor your CBG's? Twice per day.   Financial Strains and Diabetes Management:  Are you having any financial strains with the device, your supplies or your medication? No .  Does the patient want to be seen by Chronic Care Management for management of their diabetes?  No  Would the patient like to be referred to a Nutritionist or for Diabetic  Management?  No   Diabetic Exams:  Diabetic Eye Exam: Completed 2023.Pt has been advised about the importance in completing this exam.  Diabetic Foot Exam: Completed due. Pt has  been advised about the importance in completing this exam.   Interpreter Needed?: No  Information entered by :: mj Conchetta Lamia, lpn   Activities of Daily Living    05/22/2022    8:41 AM  In your present state of health, do you have any difficulty performing the following activities:  Hearing? 0  Vision? 0  Difficulty concentrating or making decisions? 0  Walking or climbing stairs? 0  Dressing or bathing? 0  Doing errands, shopping? 0  Preparing Food and eating ? N  Using the Toilet? N  In the past six months, have you accidently leaked urine? N  Do you have problems with loss of bowel control? N  Managing your Medications? N  Managing your Finances? N  Housekeeping or managing your Housekeeping? N    Patient Care Team: Susy Frizzle, MD as PCP - General (Family Medicine) Lauraine Rinne, MD (Neurology) Edythe Clarity, [facility] as Pharmacist (Pharmacist)  Indicate any recent Medical Services you may have received from other than [facility] providers in the past year (date may be approximate).     Assessment:   This is a routine wellness examination for [place].  Hearing/Vision screen Hearing Screening - Comments:: No hearing issues.  Vision Screening - Comments:: Read[facility]  Dietary issues and exercise activities discussed: Current Exercise Habits: Home exercise routine, Type of exercise: walking, Time (Minutes): 30, Frequency (Times/Week): 5, Weekly Exercise (Minutes/Week): 150, Intensity: Mild, Exercise limited by: cardiac condition(s)   Goals Addressed             This Visit's Progress    DIET - INCREASE WATER INTAKE   On track    Pt would like to lose weight. Would like to figure out what is at the core of her health issues are.      Exercise 150 min/wk Moderate Activity       Continue to exercise and work on weight loss.        Depression Screen    05/22/2022    8:38 AM 02/07/2022   10:34 AM 02/07/2022   10:20 AM 04/17/2021    12:08 PM 02/08/2021    2:05 PM 05/04/2020   12:15 PM 02/26/2019   10:24 AM  PHQ 2/9 Scores  PHQ - 2 Score 0 0 0 0 0 0 0  PHQ- 9 Score  7 0 7   0    Fall Risk    05/22/2022    8:41 AM 02/07/2022   10:20 AM 04/17/2021   12:08 PM 02/08/2021    2:12 PM 05/04/2020   12:15 PM  [place] in the past year? 0 0 1 1 0  Number falls in past yr: 0 0 1 0 0  Injury with Fall? 0 0 1 1 0  Risk for fall due to : No Fall Risks   History of fall(s);Other (Comment)   Risk for fall due to: Comment    Pt slipped on some steep steps.   Follow up Falls prevention discussed   Falls prevention discussed     FALL RISK PREVENTION PERTAINING TO THE HOME:  Any stairs in or around the home? Yes  If so, are there any without handrails? No  Home free of loose throw rugs in walkways, pet beds, electrical  cords, etc? Yes  Adequate lighting in your home to reduce risk of falls? Yes   ASSISTIVE DEVICES UTILIZED TO PREVENT FALLS:  Life alert? No  Use of a cane, walker or w/c? No  Grab bars in the bathroom? Yes  Shower chair or bench in shower? Yes  Elevated toilet seat or a handicapped toilet? Yes   TIMED UP AND GO:  Was the test performed? No .  Phone Visit  Cognitive Function:        05/22/2022    8:43 AM 02/08/2021    2:15 PM  6CIT Screen  What Year? 0 points 0 points  What month? 0 points 0 points  What time? 0 points 0 points  Count back from 20 0 points 0 points  Months in reverse 0 points 0 points  Repeat phrase 0 points 0 points  Total Score 0 points 0 points    Immunizations Immunization History  Administered Date(s) Administered   COVID-19, mRNA, vaccine(Comirnaty)12 years and older 03/06/2022   Influenza,inj,Quad PF,6+ Mos 02/02/2016, 04/29/2017, 02/06/2018, 02/26/2019, 03/02/2020, 02/21/2021, 03/06/2022   PFIZER(Purple Top)SARS-COV-2 Vaccination 07/31/2019, 08/21/2019, 02/24/2020, 11/24/2020   PNEUMOCOCCAL CONJUGATE-20 02/07/2022   Pfizer Covid-19 Vaccine Bivalent Booster  48yr & up 03/08/2021   Pneumococcal Polysaccharide-23 10/24/2014, 02/21/2021   Tdap 12/17/2011, 02/07/2022   Zoster Recombinat (Shingrix) 03/30/2021, 07/06/2021    TDAP status: Up to date  Flu Vaccine status: Up to date  Pneumococcal vaccine status: Up to date  Covid-19 vaccine status: Completed vaccines  Qualifies for Shingles Vaccine? Yes   Zostavax completed Yes   Shingrix Completed?: Yes  Screening Tests Health Maintenance  Topic Date Due   FOOT EXAM  08/09/2022 (Originally 04/17/2022)   MAMMOGRAM  08/25/2022   HEMOGLOBIN A1C  10/09/2022   OPHTHALMOLOGY EXAM  03/28/2023   Diabetic kidney evaluation - eGFR measurement  04/11/2023   Diabetic kidney evaluation - Urine ACR  04/11/2023   Medicare Annual Wellness (AWV)  05/23/2023   COLONOSCOPY (Pts 45-424yrInsurance coverage will need to be confirmed)  03/29/2030   DTaP/Tdap/Td (3 - Td or Tdap) 02/08/2032   INFLUENZA VACCINE  Completed   COVID-19 Vaccine  Completed   Hepatitis C Screening  Completed   Zoster Vaccines- Shingrix  Completed   HPV VACCINES  Aged Out   PAP SMEAR-Modifier  Discontinued   HIV Screening  Discontinued    Health Maintenance  There are no preventive care reminders to display for this patient.   Colorectal cancer screening: Type of screening: Colonoscopy. Completed 03/29/2020. Repeat every 7 years  Mammogram status: Ordered SCHEDULED FOR 06/12/2022. Pt provided with contact info and advised to call to schedule appt.   Bone Density status: Ordered NOT DUE UNTIL AGE [age]Pt provided with contact info and advised to call to schedule appt.  Lung Cancer Screening: (Low Dose CT Chest recommended if Age 55-80ears, 30 pack-year currently smoking OR have quit w/in 15years.) does not qualify.   Lung Cancer Screening Referral: N/A  Additional Screening:  Hepatitis C Screening: does qualify; Completed 02/28/2020  Vision Screening: Recommended annual ophthalmology exams for early detection of  glaucoma and other disorders of the eye. Is the patient up to date with their annual eye exam?  Yes  Who is the provider or what is the name of the office in which the patient attends annual eye exams? [facility] is not established with a provider, would they like to be referred to a provider to establish care? No .  Dental Screening: Recommended annual dental exams for proper oral hygiene  Community Resource Referral / Chronic Care Management: CRR required this visit?  No   CCM required this visit?  No      Plan:     I have personally reviewed and noted the following in the patient's chart:   Medical and social history Use of alcohol, tobacco or illicit drugs  Current medications and supplements including opioid prescriptions. Patient is not currently taking opioid prescriptions. Functional ability and status Nutritional status Physical activity Advanced directives List of other physicians Hospitalizations, surgeries, and ER visits in previous 12 months Vitals Screenings to include cognitive, depression, and falls Referrals and appointments  In addition, I have reviewed and discussed with patient certain preventive protocols, quality metrics, and best practice recommendations. A written personalized care plan for preventive services as well as general preventive health recommendations were provided to patient.     Chriss Driver, LPN   [birth year]   Nurse Notes: Pt is up to date on all health maintenance and immunizations.    I have collaborated with the care management provider regarding care management and care coordination activities outlined in this encounter and have reviewed this encounter including documentation in the note and care plan. I am certifying that I agree with the content of this note and encounter as supervising physician.

## 2022-05-22 NOTE — Telephone Encounter (Signed)
Pt called and states the Ozempic 1 mg is on national back order but the pharmacy does have the 2 mg. Can the 2 mg be sent in for the patient? Thank you.

## 2022-05-23 ENCOUNTER — Other Ambulatory Visit: Payer: Self-pay | Admitting: Family Medicine

## 2022-05-23 ENCOUNTER — Telehealth: Payer: Self-pay

## 2022-05-23 MED ORDER — SEMAGLUTIDE (2 MG/DOSE) 8 MG/3ML ~~LOC~~ SOPN: 2.0000 mg | PEN_INJECTOR | SUBCUTANEOUS | 3 refills | Status: DC

## 2022-05-23 NOTE — Telephone Encounter (Signed)
Pt called stating that the Ozempic '1mg'$  is on back order and would like to know if you would increase the dose to '2mg'$ ? For pt and husband, Juanda Crumble?  Pls advice

## 2022-06-12 ENCOUNTER — Ambulatory Visit
Admission: RE | Admit: 2022-06-12 | Discharge: 2022-06-12 | Disposition: A | Discharge status: Home / Self Care | Payer: Medicare Other | Source: Ambulatory Visit | Attending: Family Medicine | Admitting: Family Medicine

## 2022-06-12 DIAGNOSIS — Z1231 Encounter for screening mammogram for malignant neoplasm of breast: Secondary | ICD-10-CM | POA: Diagnosis not present

## 2022-06-27 DIAGNOSIS — H2511 Age-related nuclear cataract, right eye: Secondary | ICD-10-CM | POA: Diagnosis not present

## 2022-06-28 DIAGNOSIS — H2512 Age-related nuclear cataract, left eye: Secondary | ICD-10-CM | POA: Diagnosis not present

## 2022-06-28 DIAGNOSIS — Z961 Presence of intraocular lens: Secondary | ICD-10-CM | POA: Diagnosis not present

## 2022-07-19 ENCOUNTER — Other Ambulatory Visit: Payer: Self-pay | Admitting: Family Medicine

## 2022-08-07 ENCOUNTER — Ambulatory Visit (INDEPENDENT_AMBULATORY_CARE_PROVIDER_SITE_OTHER): Payer: Medicare Other | Admitting: Family Medicine

## 2022-08-07 ENCOUNTER — Encounter: Payer: Self-pay | Admitting: Family Medicine

## 2022-08-07 VITALS — BP 102/78 | HR 83 | Temp 97.8°F | Ht 62.0 in | Wt 171.0 lb

## 2022-08-07 DIAGNOSIS — J029 Acute pharyngitis, unspecified: Secondary | ICD-10-CM

## 2022-08-07 DIAGNOSIS — K122 Cellulitis and abscess of mouth: Secondary | ICD-10-CM | POA: Diagnosis not present

## 2022-08-07 NOTE — Progress Notes (Signed)
Acute Office Visit  Subjective:     Patient ID: Jacqueline [name], female    DOB: [birth year], 57 y.o.   MRN: [id number]  Chief Complaint  Patient presents with   Acute Visit    Uvula inflamed, red and swollen; bottom slightly white - JBG\\\    HPI Patient is in today for swollen and red uvula since Monday associated with a sore throat when swallowing and right sinus drainage. Denies fever, chills, body aches, cough, swelling of throat, or difficulty swallowing. No identified allergen exposure, no known sick contacts. Has tried nothing.   Review of Systems  All other systems reviewed and are negative.  Past Medical History:  Diagnosis Date   Abnormal transaminases    Allergy    Phreesia 03/01/2020   Anemia    Anxiety disorder    Arthritis    Asthma    Cancer ([place])    Phreesia 03/01/2020   Cellulitis    Chronic headaches    Diabetes mellitus without complication ([place])    Phreesia 03/01/2020   Diverticulitis    DM (diabetes mellitus) ([place])    Erosive esophagitis    Fatty liver    GERD (gastroesophageal reflux disease)    Hyperlipidemia    Phreesia 03/01/2020   IBS (irritable bowel syndrome)    Kidney stones    Neuromuscular disorder ([place])    Phreesia 03/01/2020   Obesity    Pneumonia    Pseudotumor cerebri    Sinus tachycardia    Past Surgical History:  Procedure Laterality Date   ABDOMINAL HYSTERECTOMY N/A    Phreesia 03/01/2020   ANKLE SURGERY     COLONOSCOPY  06/19/2009   COLONOSCOPY WITH PROPOFOL N/A 03/29/2020   Procedure: COLONOSCOPY WITH PROPOFOL;  Surgeon: Harvel Quale, MD;  [facility];  Service: Gastroenterology;  Laterality: N/A;  9:00   EGD  06/09/2009   FOOT SURGERY     NASAL SEPTUM SURGERY     ovarian cysts     x3   TONSILLECTOMY     TUBAL LIGATION     VAGINAL HYSTERECTOMY     Current Outpatient Medications on File Prior to Visit  Medication Sig Dispense Refill   aspirin EC 81 MG tablet Take 81 mg by mouth daily.      blood glucose meter kit and supplies KIT Dispense based on patient and insurance preference. Use up to four times daily as directed. 1 each 0   Blood Glucose Monitoring Suppl (BLOOD GLUCOSE SYSTEM PAK) KIT Please dispense based on patient and insurance preference. Use as directed to monitor FSBS 2x daily. Dx: E11.9. 1 kit 1   cetirizine (ZYRTEC) 10 MG tablet Take 10 mg by mouth daily.     COVID-19 mRNA vaccine 2023-2024 (COMIRNATY) syringe Inject into the muscle. 0.3 mL 0   DULoxetine (CYMBALTA) 60 MG capsule TAKE TWO CAPSULES BY MOUTH ONCE DAILY 60 capsule 3   EPINEPHrine 0.3 mg/0.3 mL IJ SOAJ injection Inject 0.3 mg into the muscle as needed for anaphylaxis. 1 each 0   Flaxseed, Linseed, (FLAXSEED OIL PO) Take 1 capsule by mouth daily.     fluticasone (FLONASE) 50 MCG/ACT nasal spray shake liquid AND USE TWO SPRAYS in each nostril DAILY 16 g 6   furosemide (LASIX) 20 MG tablet TAKE ONE TABLET BY MOUTH TWICE DAILY 180 tablet 3   gabapentin (NEURONTIN) 600 MG tablet take ONE-HALF TO TWO TABLETS BY MOUTH THREE TIMES DAILY AS NEEDED 540 tablet 3   glucose blood (  ACCU-CHEK GUIDE) test strip USE AS DIRECTED TWICE DAILY TO check blood sugar 100 strip 11   influenza vac split quadrivalent PF (FLUARIX) 0.5 ML injection Inject into the muscle. 0.5 mL 0   Lancets (ONETOUCH DELICA PLUS 123XX123) MISC 1 each by Does not apply route 2 (two) times daily. 200 each 11   metFORMIN (GLUCOPHAGE) 1000 MG tablet TAKE ONE TABLET BY MOUTH TWICE DAILY WITH A MEAL 180 tablet 3   metoprolol succinate (TOPROL-XL) 50 MG 24 hr tablet TAKE ONE TABLET BY MOUTH ONCE DAILY with OR immediately following A meal 90 tablet 3   oxcarbazepine (TRILEPTAL) 600 MG tablet TAKE ONE TABLET BY MOUTH ONCE DAILY 90 tablet 3   oxyCODONE (ROXICODONE) 5 MG immediate release tablet Take 1 tablet (5 mg total) by mouth every 4 (four) hours as needed for severe pain. 6 tablet 0   pantoprazole (PROTONIX) 40 MG tablet TAKE ONE TABLET BY MOUTH ONCE  DAILY 30 tablet 3   Polyethyl Glycol-Propyl Glycol (SYSTANE OP) Place 1 drop into both eyes daily as needed (dry eyes).     rosuvastatin (CRESTOR) 20 MG tablet TAKE ONE TABLET BY MOUTH ONCE DAILY 90 tablet 3   Semaglutide, 2 MG/DOSE, 8 MG/3ML SOPN Inject 2 mg as directed once a week. 3 mL 3   triamcinolone cream (KENALOG) 0.1 % APPLY TO THE AFFECTED AREA(S) TWICE DAILY as needed FOR eczema 80 g 0   valACYclovir (VALTREX) 500 MG tablet TAKE ONE TABLET BY MOUTH twice daily AS NEEDED FOR FEVER blisters 6 tablet 3   No current facility-administered medications on file prior to visit.   Allergies  Allergen Reactions   Bee Venom Anaphylaxis   Aspirin Hives, Itching and Swelling   Metoclopramide Hives   Codeine     Increases liver enzymes    Eql Antibacterial Hand Soap [Benzalkonium Chloride] Hives   Nsaids     Hallucinations         Objective:    BP 102/78   Pulse 83   Temp 97.8 F (36.6 C) (Oral)   Ht 5\' 2"  (1.575 m)   Wt 171 lb (77.6 kg)   SpO2 96%   BMI 31.28 kg/m    Physical Exam Vitals and nursing note reviewed.  Constitutional:      Appearance: Normal appearance. She is normal weight.  HENT:     Head: Normocephalic and atraumatic.     Mouth/Throat:     Pharynx: Uvula midline. Oropharyngeal exudate and posterior oropharyngeal erythema present.  Skin:    General: Skin is warm and dry.  Neurological:     General: No focal deficit present.     Mental Status: She is alert and oriented to person, place, and time. Mental status is at baseline.  Psychiatric:        Mood and Affect: Mood normal.        Behavior: Behavior normal.        Thought Content: Thought content normal.        Judgment: Judgment normal.     Results for orders placed or performed in visit on 08/07/22  STREP GROUP A AG, W/REFLEX TO CULT   Specimen: Throat  Result Value Ref Range   Streptococcus Group A AG NOT DETECTED NOT DETECTED        Assessment & Plan:   Problem List Items Addressed  This Visit       Respiratory   Viral pharyngitis - Primary    Strep negative today. On exam her uvula  is red and inflamed with a yellow patch. Likely viral etiology. Continue with symptomatic management OTC, may use Ibuprofen for pain/fever and Chloraseptic spray. Strict instructions to seek medical care immediately for swelling of her throat, difficulty breathing, difficulty swelling, or voice changes and return to office if symptoms persist or worsen.      Other Visit Diagnoses     Uvulitis       Relevant Orders   STREP GROUP A AG, W/REFLEX TO CULT (Completed)       No orders of the defined types were placed in this encounter.   Return if symptoms worsen or fail to improve.  Rubie Maid, FNP

## 2022-08-07 NOTE — Assessment & Plan Note (Signed)
Strep negative today. On exam her uvula is red and inflamed with a yellow patch. Likely viral etiology. Continue with symptomatic management OTC, may use Ibuprofen for pain/fever and Chloraseptic spray. Strict instructions to seek medical care immediately for swelling of her throat, difficulty breathing, difficulty swelling, or voice changes and return to office if symptoms persist or worsen.

## 2022-08-09 LAB — STREP GROUP A AG, W/REFLEX TO CULT: Streptococcus Group A AG: NOT DETECTED

## 2022-08-09 LAB — CULTURE, GROUP A STREP
MICRO NUMBER:: 14748606
SPECIMEN QUALITY:: ADEQUATE

## 2022-08-19 ENCOUNTER — Encounter: Payer: Self-pay | Admitting: Family Medicine

## 2022-08-22 ENCOUNTER — Ambulatory Visit: Payer: Medicare Other | Admitting: Family Medicine

## 2022-09-02 ENCOUNTER — Encounter: Payer: Self-pay | Admitting: Family Medicine

## 2022-09-02 ENCOUNTER — Ambulatory Visit (INDEPENDENT_AMBULATORY_CARE_PROVIDER_SITE_OTHER): Payer: Medicare Other | Admitting: Family Medicine

## 2022-09-02 VITALS — BP 118/64 | HR 87 | Temp 97.9°F | Ht 62.0 in | Wt 160.2 lb

## 2022-09-02 DIAGNOSIS — R112 Nausea with vomiting, unspecified: Secondary | ICD-10-CM

## 2022-09-02 MED ORDER — PROMETHAZINE HCL 25 MG PO TABS: 25.0000 mg | ORAL_TABLET | Freq: Four times a day (QID) | ORAL | 0 refills | Status: DC | PRN

## 2022-09-02 NOTE — Progress Notes (Signed)
Subjective:    Patient ID: Jacqueline [name], female    DOB: 15-Sep-1965, 57 y.o.   MRN: [id number]   Patient is on Ozempic 2 mg subcu weekly.  Over the last 5 days she reports nausea and vomiting.  She is having a difficult time keeping anything down.  She denies any fever.  She denies any chills.  She denies any symptoms of a virus such as runny nose, sore throat, coughing.  She denies any sick contacts.  No one in her household is sick with similar symptoms.  Her abdomen is soft nontender nondistended that she has diminished bowel sounds.  There is no guarding or rebound Past Medical History:  Diagnosis Date  . Abnormal transaminases   . Allergy    Phreesia 03/01/2020  . Anemia   . Anxiety disorder   . Arthritis   . Asthma   . Cancer    [facility]reesia 03/01/2020  . Cellulitis   . Chronic headaches   . Diabetes mellitus without complication    [facility]reesia 03/01/2020  . Diverticulitis   . DM (diabetes mellitus)   . Erosive esophagitis   . Fatty liver   . GERD (gastroesophageal reflux disease)   . Hyperlipidemia    [facility]reesia 03/01/2020  . IBS (irritable bowel syndrome)   . Kidney stones   . Neuromuscular disorder    [facility]ia 03/01/2020  . Obesity   . Pneumonia   . Pseudotumor cerebri   . Sinus tachycardia    Past Surgical History:  Procedure Laterality Date  . ABDOMINAL HYSTERECTOMY N/A    [facility]ia 03/01/2020  . ANKLE SURGERY    . COLONOSCOPY  06/19/2009  . COLONOSCOPY WITH PROPOFOL N/A 03/29/2020   Procedure: COLONOSCOPY WITH PROPOFOL;  Surgeon: Dolores Frame, MD;  Location: [facility];  Service: Gastroenterology;  Laterality: N/A;  9:00  . EGD  06/09/2009  . FOOT SURGERY    . NASAL SEPTUM SURGERY    . ovarian cysts     x3  . TONSILLECTOMY    . TUBAL LIGATION    . VAGINAL HYSTERECTOMY     Current Outpatient Medications on File Prior to Visit  Medication Sig Dispense Refill  . aspirin EC 81 MG tablet Take 81 mg by mouth daily.    . blood glucose meter kit and  supplies KIT Dispense based on patient and insurance preference. Use up to four times daily as directed. 1 each 0  . Blood Glucose Monitoring Suppl (BLOOD GLUCOSE SYSTEM PAK) KIT Please dispense based on patient and insurance preference. Use as directed to monitor FSBS 2x daily. Dx: E11.9. 1 kit 1  . cetirizine (ZYRTEC) 10 MG tablet Take 10 mg by mouth daily.    Marland Kitchen COVID-19 mRNA vaccine 2023-2024 (COMIRNATY) syringe Inject into the muscle. 0.3 mL 0  . DULoxetine (CYMBALTA) 60 MG capsule TAKE TWO CAPSULES BY MOUTH ONCE DAILY 60 capsule 3  . EPINEPHrine 0.3 mg/0.3 mL IJ SOAJ injection Inject 0.3 mg into the muscle as needed for anaphylaxis. 1 each 0  . Flaxseed, Linseed, (FLAXSEED OIL PO) Take 1 capsule by mouth daily.    . fluticasone (FLONASE) 50 MCG/ACT nasal spray shake liquid AND USE TWO SPRAYS in each nostril DAILY 16 g 6  . furosemide (LASIX) 20 MG tablet TAKE ONE TABLET BY MOUTH TWICE DAILY 180 tablet 3  . gabapentin (NEURONTIN) 600 MG tablet take ONE-HALF TO TWO TABLETS BY MOUTH THREE TIMES DAILY AS NEEDED 540 tablet 3  . glucose blood (ACCU-CHEK GUIDE) test  strip USE AS DIRECTED TWICE DAILY TO check blood sugar 100 strip 11  . influenza vac split quadrivalent PF (FLUARIX) 0.5 ML injection Inject into the muscle. 0.5 mL 0  . Lancets (ONETOUCH DELICA PLUS LANCET33G) MISC 1 each by Does not apply route 2 (two) times daily. 200 each 11  . metFORMIN (GLUCOPHAGE) 1000 MG tablet TAKE ONE TABLET BY MOUTH TWICE DAILY WITH A MEAL 180 tablet 3  . metoprolol succinate (TOPROL-XL) 50 MG 24 hr tablet TAKE ONE TABLET BY MOUTH ONCE DAILY with OR immediately following A meal 90 tablet 3  . oxcarbazepine (TRILEPTAL) 600 MG tablet TAKE ONE TABLET BY MOUTH ONCE DAILY 90 tablet 3  . oxyCODONE (ROXICODONE) 5 MG immediate release tablet Take 1 tablet (5 mg total) by mouth every 4 (four) hours as needed for severe pain. 6 tablet 0  . pantoprazole (PROTONIX) 40 MG tablet TAKE ONE TABLET BY MOUTH ONCE DAILY 30 tablet  3  . Polyethyl Glycol-Propyl Glycol (SYSTANE OP) Place 1 drop into both eyes daily as needed (dry eyes).    . rosuvastatin (CRESTOR) 20 MG tablet TAKE ONE TABLET BY MOUTH ONCE DAILY 90 tablet 3  . Semaglutide, 2 MG/DOSE, 8 MG/3ML SOPN Inject 2 mg as directed once a week. 3 mL 3  . triamcinolone cream (KENALOG) 0.1 % APPLY TO THE AFFECTED AREA(S) TWICE DAILY as needed FOR eczema 80 g 0  . valACYclovir (VALTREX) 500 MG tablet TAKE ONE TABLET BY MOUTH twice daily AS NEEDED FOR FEVER blisters 6 tablet 3   No current facility-administered medications on file prior to visit.   Allergies  Allergen Reactions  . Bee Venom Anaphylaxis  . Aspirin Hives, Itching and Swelling  . Metoclopramide Hives  . Codeine     Increases liver enzymes   . Eql Antibacterial Hand Soap [Benzalkonium Chloride] Hives  . Nsaids     Hallucinations    Social History   Socioeconomic History  . Marital status: Married    Spouse name: Leonette Most  . Number of children: 3  . Years of education: Not on file  . Highest education level: Not on file  Occupational History  . Occupation: Disabled  Tobacco Use  . Smoking status: Former  . Smokeless tobacco: Never  Vaping Use  . Vaping Use: Never used  Substance and Sexual Activity  . Alcohol use: No  . Drug use: No  . Sexual activity: Yes    Birth control/protection: Surgical  Other Topics Concern  . Not on file  Social History Narrative   Lives with husband and children.    Social Determinants of Health   Financial Resource Strain: Low Risk  (05/22/2022)   Overall Financial Resource Strain (CARDIA)   . Difficulty of Paying Living Expenses: Not hard at all  Food Insecurity: No Food Insecurity (05/22/2022)   Hunger Vital Sign   . Worried About Programme researcher, broadcasting/film/video in the Last Year: Never true   . Ran Out of Food in the Last Year: Never true  Transportation Needs: No Transportation Needs (05/22/2022)   PRAPARE - Transportation   . Lack of Transportation (Medical):  No   . Lack of Transportation (Non-Medical): No  Physical Activity: Sufficiently Active (05/22/2022)   Exercise Vital Sign   . Days of Exercise per Week: 5 days   . Minutes of Exercise per Session: 30 min  Stress: No Stress Concern Present (05/22/2022)   [employer] of Occupational Health - Occupational Stress Questionnaire   . Feeling of Stress :  Not at all  Social Connections: Socially Integrated (05/22/2022)   Social Connection and Isolation Panel [NHANES]   . Frequency of Communication with Friends and Family: More than three times a week   . Frequency of Social Gatherings with Friends and Family: More than three times a week   . Attends Religious Services: More than 4 times per year   . Active Member of Clubs or Organizations: Yes   . Attends Banker Meetings: More than 4 times per year   . Marital Status: Married  Catering manager Violence: Not At Risk (05/22/2022)   Humiliation, Afraid, Rape, and Kick questionnaire   . Fear of Current or Ex-Partner: No   . Emotionally Abused: No   . Physically Abused: No   . Sexually Abused: No     Review of Systems  All other systems reviewed and are negative.      Objective:   Physical Exam Vitals reviewed.  Constitutional:      General: She is not in acute distress.    Appearance: She is obese. She is not ill-appearing, toxic-appearing or diaphoretic.  Cardiovascular:     Rate and Rhythm: Normal rate and regular rhythm.     Heart sounds: No murmur heard. Pulmonary:     Effort: Pulmonary effort is normal. No respiratory distress.     Breath sounds: Normal breath sounds. No stridor. No wheezing, rhonchi or rales.  Chest:     Chest wall: No tenderness.  Abdominal:     General: Bowel sounds are decreased. There is no distension.     Palpations: Abdomen is soft.     Tenderness: There is no abdominal tenderness. There is no guarding or rebound.  Neurological:     Mental Status: She is alert.           Assessment & Plan:  Nausea and vomiting, unspecified vomiting type Symptoms do not support a viral gastroenteritis or an acute abdomen.  I believe that this is a side effect of the Ozempic.  Discontinue Ozempic and use Phenergan 25 mg every 8 hours as needed for nausea.  Eat a bland diet.  Push fluids.  Unable to use Reglan due to allergies.  Temporarily hold Lasix and metformin until feeling better

## 2022-09-06 ENCOUNTER — Other Ambulatory Visit: Payer: Self-pay | Admitting: Family Medicine

## 2022-09-06 NOTE — Telephone Encounter (Signed)
Requested medication (s) are due for refill today: Pt. Told to D/C medication 09/02/22  Requested medication (s) are on the active medication list: Yes  Last refill:  05/23/22  Future visit scheduled: No  Notes to clinic:  See request.    Requested Prescriptions  Pending Prescriptions Disp Refills   OZEMPIC, 2 MG/DOSE, 8 MG/3ML SOPN [Pharmacy Med Name: Ozempic 2 mg/dose (8 mg/3 mL) subcutaneous pen injector] 3 mL 3    Sig: INJECT 2mg  SUBCUTANEOUSLY ONCE A WEEK     Endocrinology:  Diabetes - GLP-1 Receptor Agonists - semaglutide Failed - 09/06/2022  8:01 AM      Failed - HBA1C in normal range and within 180 days    Hgb A1c MFr Bld  Date Value Ref Range Status  04/10/2022 7.0 (H) <5.7 % of total Hgb Final    Comment:    For someone without known diabetes, a hemoglobin A1c value of 6.5% or greater indicates that they may have  diabetes and this should be confirmed with a follow-up  test. . For someone with known diabetes, a value <7% indicates  that their diabetes is well controlled and a value  greater than or equal to 7% indicates suboptimal  control. A1c targets should be individualized based on  duration of diabetes, age, comorbid conditions, and  other considerations. . Currently, no consensus exists regarding use of hemoglobin A1c for diagnosis of diabetes for children. .          Failed - Valid encounter within last 6 months    Recent Outpatient Visits           1 year ago Encounter for Medicare annual wellness exam   [facility] Tanya Nones, Priscille Heidelberg, MD   1 year ago Dysuria   [facility] Donita Brooks, MD   2 years ago RLQ abdominal pain   [facility] Tanya Nones, Priscille Heidelberg, MD   2 years ago Cough   [facility] Tanya Nones, Priscille Heidelberg, MD   2 years ago Upper respiratory tract infection, unspecified type   [facility] Valentino Nose, NP              Passed - Cr in normal  range and within 360 days    Creat  Date Value Ref Range Status  04/10/2022 0.72 0.50 - 1.03 mg/dL Final   Creatinine, Urine  Date Value Ref Range Status  04/10/2022 156 20 - 275 mg/dL Final

## 2022-09-08 ENCOUNTER — Other Ambulatory Visit: Payer: Self-pay | Admitting: Family Medicine

## 2022-09-08 DIAGNOSIS — K219 Gastro-esophageal reflux disease without esophagitis: Secondary | ICD-10-CM

## 2022-09-08 DIAGNOSIS — E0842 Diabetes mellitus due to underlying condition with diabetic polyneuropathy: Secondary | ICD-10-CM

## 2022-09-09 ENCOUNTER — Encounter: Payer: Self-pay | Admitting: Family Medicine

## 2022-09-26 ENCOUNTER — Encounter: Payer: Self-pay | Admitting: Family Medicine

## 2022-10-22 ENCOUNTER — Encounter: Payer: Self-pay | Admitting: Family Medicine

## 2022-10-30 ENCOUNTER — Encounter: Payer: Self-pay | Admitting: Family Medicine

## 2022-12-03 ENCOUNTER — Encounter: Payer: Self-pay | Admitting: Family Medicine

## 2022-12-09 ENCOUNTER — Encounter: Payer: Self-pay | Admitting: Family Medicine

## 2023-01-03 ENCOUNTER — Other Ambulatory Visit: Payer: Medicare Other

## 2023-01-03 DIAGNOSIS — I10 Essential (primary) hypertension: Secondary | ICD-10-CM

## 2023-01-03 DIAGNOSIS — E1143 Type 2 diabetes mellitus with diabetic autonomic (poly)neuropathy: Secondary | ICD-10-CM

## 2023-01-03 DIAGNOSIS — K760 Fatty (change of) liver, not elsewhere classified: Secondary | ICD-10-CM

## 2023-01-03 DIAGNOSIS — E1169 Type 2 diabetes mellitus with other specified complication: Secondary | ICD-10-CM | POA: Diagnosis not present

## 2023-01-03 DIAGNOSIS — E785 Hyperlipidemia, unspecified: Secondary | ICD-10-CM

## 2023-01-04 LAB — LIPID PANEL
Cholesterol: 148 mg/dL (ref <200)
HDL: 44 mg/dL — ABNORMAL LOW (ref >=50)
LDL Cholesterol (Calc): 75 mg/dL
Non-HDL Cholesterol (Calc): 104 mg/dL (ref <130)
Total CHOL/HDL Ratio: 3.4 (calc) (ref <5.0)
Triglycerides: 193 mg/dL — ABNORMAL HIGH (ref <150)

## 2023-01-04 LAB — HEPATIC FUNCTION PANEL
AG Ratio: 1.7 (calc) (ref 1.0–2.5)
ALT: 17 U/L (ref 6–29)
AST: 22 U/L (ref 10–35)
Albumin: 4.7 g/dL (ref 3.6–5.1)
Alkaline phosphatase (APISO): 75 U/L (ref 37–153)
Bilirubin, Direct: 0.1 mg/dL (ref 0.0–0.2)
Globulin: 2.7 g/dL (ref 1.9–3.7)
Indirect Bilirubin: 0.2 mg/dL (ref 0.2–1.2)
Total Bilirubin: 0.3 mg/dL (ref 0.2–1.2)
Total Protein: 7.4 g/dL (ref 6.1–8.1)

## 2023-01-04 LAB — CBC WITH DIFFERENTIAL/PLATELET
Absolute Monocytes: 418 {cells}/uL (ref 200–950)
Basophils Absolute: 20 {cells}/uL (ref 0–200)
Basophils Relative: 0.4 %
Eosinophils Absolute: 102 {cells}/uL (ref 15–500)
Eosinophils Relative: 2 %
HCT: 37.3 % (ref 35.0–45.0)
Hemoglobin: 12.4 g/dL (ref 11.7–15.5)
Lymphs Abs: 1953 {cells}/uL (ref 850–3900)
MCH: 29 pg (ref 27.0–33.0)
MCHC: 33.2 g/dL (ref 32.0–36.0)
MCV: 87.1 fL (ref 80.0–100.0)
MPV: 10.9 fL (ref 7.5–12.5)
Monocytes Relative: 8.2 %
Neutro Abs: 2606 {cells}/uL (ref 1500–7800)
Neutrophils Relative %: 51.1 %
Platelets: 317 10*3/uL (ref 140–400)
RBC: 4.28 10*6/uL (ref 3.80–5.10)
RDW: 12.7 % (ref 11.0–15.0)
Total Lymphocyte: 38.3 %
WBC: 5.1 10*3/uL (ref 3.8–10.8)

## 2023-01-04 LAB — COMPLETE METABOLIC PANEL WITH GFR
AG Ratio: 1.7 (calc) (ref 1.0–2.5)
ALT: 17 U/L (ref 6–29)
AST: 22 U/L (ref 10–35)
Albumin: 4.7 g/dL (ref 3.6–5.1)
Alkaline phosphatase (APISO): 75 U/L (ref 37–153)
BUN: 15 mg/dL (ref 7–25)
CO2: 28 mmol/L (ref 20–32)
Calcium: 9.8 mg/dL (ref 8.6–10.4)
Chloride: 97 mmol/L — ABNORMAL LOW (ref 98–110)
Creat: 0.96 mg/dL (ref 0.50–1.03)
Globulin: 2.7 g/dL (ref 1.9–3.7)
Glucose, Bld: 120 mg/dL — ABNORMAL HIGH (ref 65–99)
Potassium: 4 mmol/L (ref 3.5–5.3)
Sodium: 139 mmol/L (ref 135–146)
Total Bilirubin: 0.3 mg/dL (ref 0.2–1.2)
Total Protein: 7.4 g/dL (ref 6.1–8.1)
eGFR: 70 mL/min/{1.73_m2} (ref >=60)

## 2023-01-04 LAB — HEMOGLOBIN A1C
Hgb A1c MFr Bld: 6.9 %{Hb} — ABNORMAL HIGH (ref <5.7)
Mean Plasma Glucose: 151 mg/dL
eAG (mmol/L): 8.4 mmol/L

## 2023-01-06 ENCOUNTER — Other Ambulatory Visit: Payer: Self-pay | Admitting: Family Medicine

## 2023-01-06 DIAGNOSIS — E1143 Type 2 diabetes mellitus with diabetic autonomic (poly)neuropathy: Secondary | ICD-10-CM

## 2023-01-07 ENCOUNTER — Other Ambulatory Visit: Payer: Self-pay | Admitting: Family Medicine

## 2023-01-07 DIAGNOSIS — K219 Gastro-esophageal reflux disease without esophagitis: Secondary | ICD-10-CM

## 2023-01-07 NOTE — Telephone Encounter (Signed)
Requested medication (s) are due for refill today - no  Requested medication (s) are on the active medication list -yes  Future visit scheduled -no  Last refill: 01/06/23  Notes to clinic: Pharmacy request: Onetouch verio- meter, lancets, test strips- new Rx  Requested Prescriptions  Pending Prescriptions Disp Refills   Blood Glucose Monitoring Suppl (FREESTYLE LITE) w/Device KIT [Pharmacy Med Name: FREESTYLE LITE METER Kit] 1 kit 10    Sig: USE UP TO 4 TIMES DAILY AS DIRECTED TO TEST BLOOD SUGAR     Endocrinology: Diabetes - Testing Supplies Failed - 01/06/2023  3:53 PM      Failed - Valid encounter within last 12 months    Recent Outpatient Visits           1 year ago Encounter for Medicare annual wellness exam   [facility] Pickard, Priscille Heidelberg, MD   2 years ago Dysuria   [facility] Donita Brooks, MD   2 years ago RLQ abdominal pain   [facility] Tanya Nones, Priscille Heidelberg, MD   2 years ago Cough   [facility] Tanya Nones, Priscille Heidelberg, MD   2 years ago Upper respiratory tract infection, unspecified type   [facility] Valentino Nose, NP                 Requested Prescriptions  Pending Prescriptions Disp Refills   Blood Glucose Monitoring Suppl (FREESTYLE LITE) w/Device KIT [Pharmacy Med Name: FREESTYLE LITE METER Kit] 1 kit 10    Sig: USE UP TO 4 TIMES DAILY AS DIRECTED TO TEST BLOOD SUGAR     Endocrinology: Diabetes - Testing Supplies Failed - 01/06/2023  3:53 PM      Failed - Valid encounter within last 12 months    Recent Outpatient Visits           1 year ago Encounter for Medicare annual wellness exam   [facility] Tanya Nones, Priscille Heidelberg, MD   2 years ago Dysuria   [facility] Donita Brooks, MD   2 years ago RLQ abdominal pain   [facility] Tanya Nones, Priscille Heidelberg, MD   2 years ago Cough   [facility] Tanya Nones Priscille Heidelberg,  MD   2 years ago Upper respiratory tract infection, unspecified type   [facility] Valentino Nose, NP

## 2023-01-08 NOTE — Telephone Encounter (Signed)
Requested Prescriptions  Pending Prescriptions Disp Refills   pantoprazole (PROTONIX) 40 MG tablet [Pharmacy Med Name: PANTOPRAZOLE DR 40MG  TAB 40 Tablet] 90 tablet 0    Sig: TAKE 1 TABLET BY MOUTH ONCE DAILY *REFILL REQUEST*     Gastroenterology: Proton Pump Inhibitors Failed - 01/07/2023  5:07 PM      Failed - Valid encounter within last 12 months    Recent Outpatient Visits           1 year ago Encounter for Medicare annual wellness exam   [facility] Pickard, Priscille Heidelberg, MD   2 years ago Dysuria   [facility] Donita Brooks, MD   2 years ago RLQ abdominal pain   [facility] Tanya Nones, Priscille Heidelberg, MD   2 years ago Cough   [facility] Tanya Nones, Priscille Heidelberg, MD   2 years ago Upper respiratory tract infection, unspecified type   [facility] Valentino Nose, NP               promethazine (PHENERGAN) 25 MG tablet [Pharmacy Med Name: PROMETHAZINE 25MG  TABS 25 Tablet] 30 tablet 10    Sig: TAKE 1 TABLET BY MOUTH EVERY 6 HOURS AS NEEDED FOR NAUSEA & VOMITING *REFILL REQUEST*     Not Delegated - Gastroenterology: Antiemetics Failed - 01/07/2023  5:07 PM      Failed - This refill cannot be delegated      Failed - Valid encounter within last 6 months    Recent Outpatient Visits           1 year ago Encounter for Medicare annual wellness exam   [facility] Donita Brooks, MD   2 years ago Dysuria   [facility] Donita Brooks, MD   2 years ago RLQ abdominal pain   [facility] Tanya Nones, Priscille Heidelberg, MD   2 years ago Cough   [facility] Tanya Nones Priscille Heidelberg, MD   2 years ago Upper respiratory tract infection, unspecified type   [facility] Valentino Nose, NP

## 2023-01-08 NOTE — Telephone Encounter (Signed)
Requested medication (s) are due for refill today: routing for review  Requested medication (s) are on the active medication list: yes  Last refill:  09/02/22  Future visit scheduled: no  Notes to clinic:  Unable to refill per protocol, cannot delegate.      Requested Prescriptions  Pending Prescriptions Disp Refills   promethazine (PHENERGAN) 25 MG tablet [Pharmacy Med Name: PROMETHAZINE 25MG  TABS 25 Tablet] 30 tablet 10    Sig: TAKE 1 TABLET BY MOUTH EVERY 6 HOURS AS NEEDED FOR NAUSEA & VOMITING *REFILL REQUEST*     Not Delegated - Gastroenterology: Antiemetics Failed - 01/07/2023  5:07 PM      Failed - This refill cannot be delegated      Failed - Valid encounter within last 6 months    Recent Outpatient Visits           1 year ago Encounter for Medicare annual wellness exam   [facility] Tanya Nones, Priscille Heidelberg, MD   2 years ago Dysuria   [facility] Donita Brooks, MD   2 years ago RLQ abdominal pain   [facility] Tanya Nones, Priscille Heidelberg, MD   2 years ago Cough   [facility] Tanya Nones, Priscille Heidelberg, MD   2 years ago Upper respiratory tract infection, unspecified type   [facility] Valentino Nose, NP              Signed Prescriptions Disp Refills   pantoprazole (PROTONIX) 40 MG tablet 90 tablet 0    Sig: TAKE 1 TABLET BY MOUTH ONCE DAILY *REFILL REQUEST*     Gastroenterology: Proton Pump Inhibitors Failed - 01/07/2023  5:07 PM      Failed - Valid encounter within last 12 months    Recent Outpatient Visits           1 year ago Encounter for Medicare annual wellness exam   [facility] Donita Brooks, MD   2 years ago Dysuria   [facility] Donita Brooks, MD   2 years ago RLQ abdominal pain   [facility] Tanya Nones, Priscille Heidelberg, MD   2 years ago Cough   [facility] Tanya Nones Priscille Heidelberg, MD   2 years ago Upper respiratory  tract infection, unspecified type   [facility] Valentino Nose, NP

## 2023-01-09 ENCOUNTER — Other Ambulatory Visit: Payer: Self-pay | Admitting: Family Medicine

## 2023-01-09 DIAGNOSIS — K219 Gastro-esophageal reflux disease without esophagitis: Secondary | ICD-10-CM

## 2023-01-09 DIAGNOSIS — E0842 Diabetes mellitus due to underlying condition with diabetic polyneuropathy: Secondary | ICD-10-CM

## 2023-01-10 ENCOUNTER — Other Ambulatory Visit: Payer: Self-pay | Admitting: Family Medicine

## 2023-01-10 MED ORDER — ACCU-CHEK GUIDE VI STRP: ORAL_STRIP | 11 refills | Status: DC

## 2023-01-10 MED ORDER — ONETOUCH DELICA PLUS LANCET33G MISC: 1.0000 | Freq: Two times a day (BID) | 11 refills | Status: DC

## 2023-01-10 MED ORDER — BLOOD GLUCOSE MONITOR KIT
PACK | 0 refills | Status: DC
Start: 2023-01-10 — End: 2023-10-13

## 2023-01-10 NOTE — Telephone Encounter (Signed)
Requested Prescriptions  Pending Prescriptions Disp Refills   DULoxetine (CYMBALTA) 60 MG capsule [Pharmacy Med Name: duloxetine 60 mg capsule,delayed release] 60 capsule 2    Sig: TAKE TWO CAPSULES BY MOUTH ONCE DAILY     Psychiatry: Antidepressants - SNRI - duloxetine Failed - 01/09/2023  8:03 AM      Failed - Valid encounter within last 6 months    Recent Outpatient Visits           1 year ago Encounter for Medicare annual wellness exam   [facility] Donita Brooks, MD   2 years ago Dysuria   [facility] Donita Brooks, MD   2 years ago RLQ abdominal pain   [facility] Donita Brooks, MD   2 years ago Cough   [facility] Donita Brooks, MD   2 years ago Upper respiratory tract infection, unspecified type   [facility] Valentino Nose, NP              Passed - Cr in normal range and within 360 days    Creat  Date Value Ref Range Status  01/03/2023 0.96 0.50 - 1.03 mg/dL Final   Creatinine, Urine  Date Value Ref Range Status  04/10/2022 156 20 - 275 mg/dL Final         Passed - eGFR is 30 or above and within 360 days    GFR, Est African American  Date Value Ref Range Status  02/28/2020 114 > OR = 60 mL/min/1.80m2 Final   GFR, Est Non African American  Date Value Ref Range Status  02/28/2020 99 > OR = 60 mL/min/1.55m2 Final   GFR, Estimated  Date Value Ref Range Status  07/12/2020 >60 >60 mL/min Final    Comment:    (NOTE) Calculated using the CKD-EPI Creatinine Equation (2021)    eGFR  Date Value Ref Range Status  01/03/2023 70 > OR = 60 mL/min/1.26m2 Final         Passed - Completed PHQ-2 or PHQ-9 in the last 360 days      Passed - Last BP in normal range    BP Readings from Last 1 Encounters:  09/02/22 118/64         Refused Prescriptions Disp Refills   pantoprazole (PROTONIX) 40 MG tablet [Pharmacy Med Name: pantoprazole 40 mg tablet,delayed  release] 30 tablet 3    Sig: TAKE ONE TABLET BY MOUTH ONCE DAILY     Gastroenterology: Proton Pump Inhibitors Failed - 01/09/2023  8:03 AM      Failed - Valid encounter within last 12 months    Recent Outpatient Visits           1 year ago Encounter for Medicare annual wellness exam   [facility] Donita Brooks, MD   2 years ago Dysuria   [facility] Donita Brooks, MD   2 years ago RLQ abdominal pain   [facility] Tanya Nones, Priscille Heidelberg, MD   2 years ago Cough   [facility] Tanya Nones Priscille Heidelberg, MD   2 years ago Upper respiratory tract infection, unspecified type   [facility] Medicine Valentino Nose, NP

## 2023-01-10 NOTE — Telephone Encounter (Signed)
Requested medication (s) are due for refill today: yes  Requested medication (s) are on the active medication list: yes  Last refill:  05/16/22 80 grams  Future visit scheduled: no  Notes to clinic:  med not delegated to NT to RF   Requested Prescriptions  Pending Prescriptions Disp Refills   triamcinolone cream (KENALOG) 0.1 % [Pharmacy Med Name: TRIAMCINOL 0.1% 80GM CRM 0.1 Cream] 80 g 10    Sig: APPLY TO AFFECTED AREA(S) TWICE DAILY AS NEEDED FOR ECZEMA     Not Delegated - Dermatology:  Corticosteroids Failed - 01/10/2023  4:44 PM      Failed - This refill cannot be delegated      Failed - Valid encounter within last 12 months    Recent Outpatient Visits           1 year ago Encounter for Medicare annual wellness exam   [facility] Donita Brooks, MD   2 years ago Dysuria   [facility] Donita Brooks, MD   2 years ago RLQ abdominal pain   [facility] Tanya Nones, Priscille Heidelberg, MD   2 years ago Cough   [facility] Donita Brooks, MD   2 years ago Upper respiratory tract infection, unspecified type   [facility] Medicine Valentino Nose, NP

## 2023-01-16 NOTE — Telephone Encounter (Signed)
Received call from [facility] with [facility] called to follow up on refill requested for triamcinolone cream (KENALOG) 0.1 %   *Mail order company; takes a couple of weeks to pack, ship, and arrive. Requesting refill be sent in asap**  [facility] 09/02/2022  [facility]  [address] [place], [place] [address]  Requesting call back.   Please advise at [phone]

## 2023-01-19 ENCOUNTER — Encounter: Payer: Self-pay | Admitting: Family Medicine

## 2023-01-19 DIAGNOSIS — K219 Gastro-esophageal reflux disease without esophagitis: Secondary | ICD-10-CM

## 2023-01-21 ENCOUNTER — Other Ambulatory Visit (HOSPITAL_BASED_OUTPATIENT_CLINIC_OR_DEPARTMENT_OTHER): Payer: Self-pay

## 2023-01-21 ENCOUNTER — Other Ambulatory Visit: Payer: Self-pay

## 2023-01-21 DIAGNOSIS — E0842 Diabetes mellitus due to underlying condition with diabetic polyneuropathy: Secondary | ICD-10-CM

## 2023-01-21 MED ORDER — FLULAVAL 0.5 ML IM SUSY
0.5000 mL | PREFILLED_SYRINGE | Freq: Once | INTRAMUSCULAR | 0 refills | Status: AC
  Filled 2023-01-21: qty 0.5, 1d supply, fill #0

## 2023-01-21 MED ORDER — ACCU-CHEK GUIDE VI STRP: ORAL_STRIP | 11 refills | Status: DC

## 2023-01-21 MED ORDER — ONETOUCH DELICA PLUS LANCET33G MISC: 1.0000 | Freq: Two times a day (BID) | 11 refills | Status: AC

## 2023-01-21 MED ORDER — PANTOPRAZOLE SODIUM 40 MG PO TBEC
DELAYED_RELEASE_TABLET | ORAL | 0 refills | Status: DC
Start: 2023-01-21 — End: 2023-07-01

## 2023-01-21 MED ORDER — DULOXETINE HCL 60 MG PO CPEP
ORAL_CAPSULE | ORAL | 2 refills | Status: DC
Start: 2023-01-21 — End: 2023-01-27

## 2023-01-21 MED ORDER — COMIRNATY 30 MCG/0.3ML IM SUSY
0.3000 mL | PREFILLED_SYRINGE | Freq: Once | INTRAMUSCULAR | 0 refills | Status: AC
  Filled 2023-01-21: qty 0.3, 1d supply, fill #0

## 2023-01-22 ENCOUNTER — Ambulatory Visit: Payer: Medicare Other

## 2023-01-27 ENCOUNTER — Other Ambulatory Visit: Payer: Self-pay

## 2023-01-27 DIAGNOSIS — E1169 Type 2 diabetes mellitus with other specified complication: Secondary | ICD-10-CM

## 2023-01-27 DIAGNOSIS — E0842 Diabetes mellitus due to underlying condition with diabetic polyneuropathy: Secondary | ICD-10-CM

## 2023-01-27 DIAGNOSIS — E1143 Type 2 diabetes mellitus with diabetic autonomic (poly)neuropathy: Secondary | ICD-10-CM

## 2023-01-27 MED ORDER — GABAPENTIN 600 MG PO TABS: ORAL_TABLET | ORAL | 3 refills | Status: DC

## 2023-01-27 MED ORDER — DULOXETINE HCL 60 MG PO CPEP: ORAL_CAPSULE | ORAL | 2 refills | Status: DC

## 2023-01-27 MED ORDER — BLOOD GLUCOSE SYSTEM PAK KIT: PACK | 1 refills | Status: DC

## 2023-02-14 ENCOUNTER — Encounter: Payer: Self-pay | Admitting: Family Medicine

## 2023-02-14 ENCOUNTER — Ambulatory Visit (HOSPITAL_COMMUNITY)
Admission: RE | Admit: 2023-02-14 | Discharge: 2023-02-14 | Disposition: A | Discharge status: Home / Self Care | Payer: Medicare Other | Source: Ambulatory Visit | Attending: Family Medicine | Admitting: Family Medicine

## 2023-02-14 ENCOUNTER — Ambulatory Visit (INDEPENDENT_AMBULATORY_CARE_PROVIDER_SITE_OTHER): Payer: Medicare Other | Admitting: Family Medicine

## 2023-02-14 VITALS — BP 136/86 | HR 92 | Temp 97.7°F | Ht 62.0 in | Wt 172.0 lb

## 2023-02-14 DIAGNOSIS — R109 Unspecified abdominal pain: Secondary | ICD-10-CM | POA: Insufficient documentation

## 2023-02-14 DIAGNOSIS — R091 Pleurisy: Secondary | ICD-10-CM | POA: Diagnosis not present

## 2023-02-14 LAB — MICROSCOPIC MESSAGE

## 2023-02-14 LAB — URINALYSIS, ROUTINE W REFLEX MICROSCOPIC
Bacteria, UA: NONE SEEN /[HPF]
Glucose, UA: NEGATIVE
Hyaline Cast: NONE SEEN /[LPF]
Nitrite: NEGATIVE
RBC / HPF: NONE SEEN /[HPF] (ref 0–2)
Specific Gravity, Urine: 1.015 (ref 1.001–1.035)
pH: 6 (ref 5.0–8.0)

## 2023-02-14 MED ORDER — CARISOPRODOL 350 MG PO TABS
350.0000 mg | ORAL_TABLET | Freq: Four times a day (QID) | ORAL | 0 refills | Status: DC | PRN
Start: 2023-02-14 — End: 2023-05-22

## 2023-02-14 NOTE — Progress Notes (Signed)
Subjective:    Patient ID: Jacqueline [name], female    DOB: 07-18-1965, 57 y.o.   MRN: [id number]  Starting Tuesday, the patient developed pain in her left upper flank/left lower lung.  The pain is constant.  She states if she takes a really deep breath then the pain is slightly worse.  She denies any shortness of breath.  She denies any hemoptysis.  She denies any cough.  She denies any fever.  Pulse oximetry is normal.  She denies any dysuria.  Urinalysis shows trace blood, negative nitrates, trace leukocyte esterase.  She denies any abdominal pain.  She has some mild left-sided CVA tenderness.  She also has some pain in the left flank when I asked her to pinch her shoulder blades together.  Therefore movement seem to make this worse. Past Medical History:  Diagnosis Date   Abnormal transaminases    Allergy    Phreesia 03/01/2020   Anemia    Anxiety disorder    Arthritis    Asthma    Cancer (HCC)    Phreesia 03/01/2020   Cellulitis    Chronic headaches    Diabetes mellitus without complication (HCC)    Phreesia 03/01/2020   Diverticulitis    DM (diabetes mellitus) (HCC)    Erosive esophagitis    Fatty liver    GERD (gastroesophageal reflux disease)    Hyperlipidemia    [facility]reesia 03/01/2020   IBS (irritable bowel syndrome)    Kidney stones    Neuromuscular disorder (HCC)    [facility]reesia 03/01/2020   Obesity    Pneumonia    Pseudotumor cerebri    Sinus tachycardia    Past Surgical History:  Procedure Laterality Date   ABDOMINAL HYSTERECTOMY N/A    Phreesia 03/01/2020   ANKLE SURGERY     COLONOSCOPY  06/19/2009   COLONOSCOPY WITH PROPOFOL N/A 03/29/2020   Procedure: COLONOSCOPY WITH PROPOFOL;  Surgeon: Dolores Frame, MD;  [facility]: [facility];  Service: Gastroenterology;  Laterality: N/A;  9:00   EGD  06/09/2009   FOOT SURGERY     NASAL SEPTUM SURGERY     ovarian cysts     x3   TONSILLECTOMY     TUBAL LIGATION     VAGINAL HYSTERECTOMY     Current Outpatient  Medications on File Prior to Visit  Medication Sig Dispense Refill   aspirin EC 81 MG tablet Take 81 mg by mouth daily.     blood glucose meter kit and supplies KIT Dispense based on patient and insurance preference. Use up to four times daily as directed. 1 each 0   Blood Glucose Monitoring Suppl (BLOOD GLUCOSE SYSTEM PAK) KIT Please dispense based on patient and insurance preference. Use as directed to monitor FSBS 2x daily. Dx: E11.9. 1 kit 1   cetirizine (ZYRTEC) 10 MG tablet Take 10 mg by mouth daily.     COVID-19 mRNA vaccine 2023-2024 (COMIRNATY) syringe Inject into the muscle. 0.3 mL 0   DULoxetine (CYMBALTA) 60 MG capsule TAKE TWO CAPSULES BY MOUTH ONCE DAILY 60 capsule 2   EPINEPHrine 0.3 mg/0.3 mL IJ SOAJ injection Inject 0.3 mg into the muscle as needed for anaphylaxis. 1 each 0   Flaxseed, Linseed, (FLAXSEED OIL PO) Take 1 capsule by mouth daily.     fluticasone (FLONASE) 50 MCG/ACT nasal spray shake liquid AND USE TWO SPRAYS in each nostril DAILY 16 g 6   furosemide (LASIX) 20 MG tablet TAKE ONE TABLET BY MOUTH TWICE DAILY 180 tablet  3   gabapentin (NEURONTIN) 600 MG tablet TAKE ONE-HALF TO TWO TABLETS BY MOUTH THREE TIMES DAILY AS NEEDED 540 tablet 3   glucose blood (ACCU-CHEK GUIDE) test strip USE AS DIRECTED TWICE DAILY TO check blood sugar 100 strip 11   influenza vac split quadrivalent PF (FLUARIX) 0.5 ML injection Inject into the muscle. 0.5 mL 0   Lancets (ONETOUCH DELICA PLUS LANCET33G) MISC 1 each by Does not apply route 2 (two) times daily. 200 each 11   metFORMIN (GLUCOPHAGE) 1000 MG tablet TAKE ONE TABLET BY MOUTH TWICE DAILY WITH A MEAL 180 tablet 3   metoprolol succinate (TOPROL-XL) 50 MG 24 hr tablet TAKE ONE TABLET BY MOUTH ONCE DAILY with OR immediately following A meal 90 tablet 3   oxcarbazepine (TRILEPTAL) 600 MG tablet TAKE ONE TABLET BY MOUTH ONCE DAILY 90 tablet 3   oxyCODONE (ROXICODONE) 5 MG immediate release tablet Take 1 tablet (5 mg total) by mouth every 4  (four) hours as needed for severe pain. 6 tablet 0   OZEMPIC, 2 MG/DOSE, 8 MG/3ML SOPN INJECT 2mg  SUBCUTANEOUSLY ONCE A WEEK 3 mL 3   pantoprazole (PROTONIX) 40 MG tablet TAKE 1 TABLET BY MOUTH ONCE DAILY *REFILL REQUEST* 90 tablet 0   Polyethyl Glycol-Propyl Glycol (SYSTANE OP) Place 1 drop into both eyes daily as needed (dry eyes).     promethazine (PHENERGAN) 25 MG tablet TAKE 1 TABLET BY MOUTH EVERY 6 HOURS AS NEEDED FOR NAUSEA & VOMITING *REFILL REQUEST* 30 tablet 10   rosuvastatin (CRESTOR) 20 MG tablet TAKE ONE TABLET BY MOUTH ONCE DAILY 90 tablet 3   triamcinolone cream (KENALOG) 0.1 % APPLY TO AFFECTED AREA(S) TWICE DAILY AS NEEDED FOR ECZEMA 80 g 10   valACYclovir (VALTREX) 500 MG tablet TAKE ONE TABLET BY MOUTH twice daily AS NEEDED FOR FEVER blisters 6 tablet 3   No current facility-administered medications on file prior to visit.   Allergies  Allergen Reactions   Bee Venom Anaphylaxis   Aspirin Hives, Itching and Swelling   Metoclopramide Hives   Codeine     Increases liver enzymes    Eql Antibacterial Hand Soap [Benzalkonium Chloride] Hives   Nsaids     Hallucinations    Social History   Socioeconomic History   Marital status: Married    Spouse name: Charles   Number of children: 3   Years of education: Not on file   Highest education level: GED or equivalent  Occupational History   Occupation: Disabled  Tobacco Use   Smoking status: Former   Smokeless tobacco: Never  Advertising account planner   Vaping status: Never Used  Substance and Sexual Activity   Alcohol use: No   Drug use: No   Sexual activity: Yes    Birth control/protection: Surgical  Other Topics Concern   Not on file  Social History Narrative   Lives with husband and children.    Social Determinants of Health   Financial Resource Strain: Low Risk  (02/12/2023)   Overall Financial Resource Strain (CARDIA)    Difficulty of Paying Living Expenses: Not very hard  Food Insecurity: No Food Insecurity  (02/12/2023)   Hunger Vital Sign    Worried About Running Out of Food in the Last Year: Never true    Ran Out of Food in the Last Year: Never true  Transportation Needs: No Transportation Needs (02/12/2023)   PRAPARE - Administrator, Civil Service (Medical): No    Lack of Transportation (Non-Medical): No  Physical  Activity: Unknown (02/12/2023)   Exercise Vital Sign    Days of Exercise per Week: Patient declined    Minutes of Exercise per Session: 30 min  Stress: No Stress Concern Present (02/12/2023)   [employer] of Occupational Health - Occupational Stress Questionnaire    Feeling of Stress : Only a little  Social Connections: Socially Integrated (02/12/2023)   Social Connection and Isolation Panel [NHANES]    Frequency of Communication with Friends and Family: More than three times a week    Frequency of Social Gatherings with Friends and Family: Once a week    Attends Religious Services: 1 to 4 times per year    Active Member of [employer] or Organizations: No    Attends Engineer, structural: More than 4 times per year    Marital Status: Married  Catering manager Violence: Not At Risk (05/22/2022)   Humiliation, Afraid, Rape, and Kick questionnaire    Fear of Current or Ex-Partner: No    Emotionally Abused: No    Physically Abused: No    Sexually Abused: No     Review of Systems  All other systems reviewed and are negative.      Objective:   Physical Exam Vitals reviewed.  Constitutional:      General: She is not in acute distress.    Appearance: She is obese. She is not ill-appearing, toxic-appearing or diaphoretic.  Cardiovascular:     Rate and Rhythm: Normal rate and regular rhythm.     Heart sounds: No murmur heard.    No friction rub.  Pulmonary:     Effort: Pulmonary effort is normal. No respiratory distress.     Breath sounds: Normal breath sounds. No stridor. No wheezing, rhonchi or rales.    Chest:     Chest wall: No tenderness.   Abdominal:     General: Bowel sounds are decreased. There is no distension.     Palpations: Abdomen is soft.     Tenderness: There is no abdominal tenderness. There is no guarding or rebound.  Skin:    Findings: Rash is not macular, papular, pustular, scaling, urticarial or vesicular.  Neurological:     Mental Status: She is alert.           Assessment & Plan:  Flank pain - Plan: Urinalysis, Routine w reflex microscopic  Pleurisy - Plan: CBC with Differential/Platelet, COMPLETE METABOLIC PANEL WITH GFR, D-dimer, quantitative, DG Chest 2 View I believe the patient may have pulled a muscle in her back trying to lift her mother from a wheelchair or off the bed.  I will get a chest x-ray and a D-dimer to rule out pulmonary embolism.  However I feel that this is highly unlikely.  Urinalysis does not suggest pyelonephritis nor does her presentation.  If pain persist, consider imaging to evaluate for kidney stone.  Treat muscle pull with Soma 350 mg every 6 hours as needed for pain

## 2023-02-15 LAB — CBC WITH DIFFERENTIAL/PLATELET
Absolute Monocytes: 548 {cells}/uL (ref 200–950)
Basophils Absolute: 33 {cells}/uL (ref 0–200)
Basophils Relative: 0.4 %
Eosinophils Absolute: 158 {cells}/uL (ref 15–500)
Eosinophils Relative: 1.9 %
HCT: 38.3 % (ref 35.0–45.0)
Hemoglobin: 12.6 g/dL (ref 11.7–15.5)
Lymphs Abs: 3312 {cells}/uL (ref 850–3900)
MCH: 28.7 pg (ref 27.0–33.0)
MCHC: 32.9 g/dL (ref 32.0–36.0)
MCV: 87.2 fL (ref 80.0–100.0)
MPV: 10.5 fL (ref 7.5–12.5)
Monocytes Relative: 6.6 %
Neutro Abs: 4250 {cells}/uL (ref 1500–7800)
Neutrophils Relative %: 51.2 %
Platelets: 356 10*3/uL (ref 140–400)
RBC: 4.39 10*6/uL (ref 3.80–5.10)
RDW: 12.9 % (ref 11.0–15.0)
Total Lymphocyte: 39.9 %
WBC: 8.3 10*3/uL (ref 3.8–10.8)

## 2023-02-15 LAB — COMPLETE METABOLIC PANEL WITH GFR
AG Ratio: 1.8 (calc) (ref 1.0–2.5)
ALT: 22 U/L (ref 6–29)
AST: 25 U/L (ref 10–35)
Albumin: 4.8 g/dL (ref 3.6–5.1)
Alkaline phosphatase (APISO): 90 U/L (ref 37–153)
BUN: 14 mg/dL (ref 7–25)
CO2: 29 mmol/L (ref 20–32)
Calcium: 10 mg/dL (ref 8.6–10.4)
Chloride: 94 mmol/L — ABNORMAL LOW (ref 98–110)
Creat: 0.96 mg/dL (ref 0.50–1.03)
Globulin: 2.7 g/dL (ref 1.9–3.7)
Glucose, Bld: 110 mg/dL — ABNORMAL HIGH (ref 65–99)
Potassium: 4 mmol/L (ref 3.5–5.3)
Sodium: 138 mmol/L (ref 135–146)
Total Bilirubin: 0.3 mg/dL (ref 0.2–1.2)
Total Protein: 7.5 g/dL (ref 6.1–8.1)
eGFR: 70 mL/min/{1.73_m2} (ref >=60)

## 2023-02-15 LAB — D-DIMER, QUANTITATIVE: D-Dimer, Quant: <0.19 ug{FEU}/mL (ref <0.50)

## 2023-02-27 ENCOUNTER — Other Ambulatory Visit: Payer: Self-pay | Admitting: Family Medicine

## 2023-02-28 NOTE — Telephone Encounter (Signed)
Requested Prescriptions  Refused Prescriptions Disp Refills   metoprolol succinate (TOPROL-XL) 50 MG 24 hr tablet [Pharmacy Med Name: METOPROLOL SUC ER 50MG  TAB 50 Tablet] 30 tablet 10    Sig: TAKE 1 TABLET BY MOUTH ONCE DAILY     Cardiovascular:  Beta Blockers Failed - 02/27/2023  7:02 PM      Failed - Valid encounter within last 6 months    Recent Outpatient Visits           1 year ago Encounter for Medicare annual wellness exam   [facility] Tanya Nones, Priscille Heidelberg, MD   2 years ago Dysuria   [facility] Donita Brooks, MD   2 years ago RLQ abdominal pain   [facility] Tanya Nones, Priscille Heidelberg, MD   2 years ago Cough   [facility] Tanya Nones, Priscille Heidelberg, MD   2 years ago Upper respiratory tract infection, unspecified type   [facility] Valentino Nose, NP       Future Appointments             In 1 month Pickard, Priscille Heidelberg, MD [facility]            Passed - Last BP in normal range    BP Readings from Last 1 Encounters:  02/14/23 136/86         Passed - Last Heart Rate in normal range    Pulse Readings from Last 1 Encounters:  02/14/23 92          rosuvastatin (CRESTOR) 20 MG tablet [Pharmacy Med Name: ROSUVASTATIN 20MG  TAB 20 Tablet] 30 tablet 10    Sig: TAKE 1 TABLET BY MOUTH ONCE DAILY     Cardiovascular:  Antilipid - Statins 2 Failed - 02/27/2023  7:02 PM      Failed - Valid encounter within last 12 months    Recent Outpatient Visits           1 year ago Encounter for Medicare annual wellness exam   [facility] Tanya Nones, Priscille Heidelberg, MD   2 years ago Dysuria   [facility] Donita Brooks, MD   2 years ago RLQ abdominal pain   [facility] Tanya Nones, Priscille Heidelberg, MD   2 years ago Cough   [facility] Tanya Nones, Priscille Heidelberg, MD   2 years ago Upper respiratory tract infection, unspecified type    [facility] Valentino Nose, NP       Future Appointments             In 1 month Pickard, Priscille Heidelberg, MD [facility]            Failed - Lipid Panel in normal range within the last 12 months    Cholesterol  Date Value Ref Range Status  01/03/2023 148 <200 mg/dL Final   LDL Cholesterol (Calc)  Date Value Ref Range Status  01/03/2023 75 mg/dL (calc) Final    Comment:    Reference range: <100 . Desirable range <100 mg/dL for primary prevention;   <70 mg/dL for patients with CHD or diabetic patients  with > or = 2 CHD risk factors. Marland Kitchen LDL-C is now calculated using the Martin-Hopkins  calculation, which is a validated novel method providing  better accuracy than the Friedewald equation in the  estimation of LDL-C.  Horald Pollen et  al. JAMA. 6962;952(84): 2061-2068  (http://education.[facility].com/faq/FAQ164)    HDL  Date Value Ref Range Status  01/03/2023 44 (L) > OR = 50 mg/dL Final   Triglycerides  Date Value Ref Range Status  01/03/2023 193 (H) <150 mg/dL Final         Passed - Cr in normal range and within 360 days    Creat  Date Value Ref Range Status  02/14/2023 0.96 0.50 - 1.03 mg/dL Final   Creatinine, Urine  Date Value Ref Range Status  04/10/2022 156 20 - 275 mg/dL Final         Passed - Patient is not pregnant       furosemide (LASIX) 20 MG tablet [Pharmacy Med Name: FUROSEMIDE 20 MG TABS 20 Tablet] 60 tablet 10    Sig: TAKE 1 TABLET BY MOUTH TWICE DAILY     Cardiovascular:  Diuretics - Loop Failed - 02/27/2023  7:02 PM      Failed - Cl in normal range and within 180 days    Chloride  Date Value Ref Range Status  02/14/2023 94 (L) 98 - 110 mmol/L Final         Failed - Mg Level in normal range and within 180 days    No results found for: "MG"       Failed - Valid encounter within last 6 months    Recent Outpatient Visits           1 year ago Encounter for Medicare annual  wellness exam   [facility] Donita Brooks, MD   2 years ago Dysuria   [facility] Donita Brooks, MD   2 years ago RLQ abdominal pain   [facility] Tanya Nones, Priscille Heidelberg, MD   2 years ago Cough   [facility] Donita Brooks, MD   2 years ago Upper respiratory tract infection, unspecified type   [facility] Valentino Nose, NP       Future Appointments             In 1 month Pickard, Priscille Heidelberg, MD [facility], [facility]            Passed - K in normal range and within 180 days    Potassium  Date Value Ref Range Status  02/14/2023 4.0 3.5 - 5.3 mmol/L Final         Passed - Ca in normal range and within 180 days    Calcium  Date Value Ref Range Status  02/14/2023 10.0 8.6 - 10.4 mg/dL Final         Passed - Na in normal range and within 180 days    Sodium  Date Value Ref Range Status  02/14/2023 138 135 - 146 mmol/L Final         Passed - Cr in normal range and within 180 days    Creat  Date Value Ref Range Status  02/14/2023 0.96 0.50 - 1.03 mg/dL Final   Creatinine, Urine  Date Value Ref Range Status  04/10/2022 156 20 - 275 mg/dL Final         Passed - Last BP in normal range    BP Readings from Last 1 Encounters:  02/14/23 136/86

## 2023-03-05 ENCOUNTER — Other Ambulatory Visit: Payer: Self-pay | Admitting: Family Medicine

## 2023-03-07 NOTE — Telephone Encounter (Signed)
Requested Prescriptions  Refused Prescriptions Disp Refills   furosemide (LASIX) 20 MG tablet [Pharmacy Med Name: FUROSEMIDE 20 MG TABS 20 Tablet] 60 tablet 10    Sig: TAKE 1 TABLET BY MOUTH TWICE DAILY     Cardiovascular:  Diuretics - Loop Failed - 03/05/2023 12:32 PM      Failed - Cl in normal range and within 180 days    Chloride  Date Value Ref Range Status  02/14/2023 94 (L) 98 - 110 mmol/L Final         Failed - Mg Level in normal range and within 180 days    No results found for: "MG"       Failed - Valid encounter within last 6 months    Recent Outpatient Visits           1 year ago Encounter for Medicare annual wellness exam   [facility] Donita Brooks, MD   2 years ago Dysuria   [facility] Donita Brooks, MD   2 years ago RLQ abdominal pain   [facility] Tanya Nones, Priscille Heidelberg, MD   2 years ago Cough   [facility] Donita Brooks, MD   2 years ago Upper respiratory tract infection, unspecified type   [facility] Valentino Nose, NP       Future Appointments             In 1 month Pickard, Priscille Heidelberg, MD [facility]            Passed - K in normal range and within 180 days    Potassium  Date Value Ref Range Status  02/14/2023 4.0 3.5 - 5.3 mmol/L Final         Passed - Ca in normal range and within 180 days    Calcium  Date Value Ref Range Status  02/14/2023 10.0 8.6 - 10.4 mg/dL Final         Passed - Na in normal range and within 180 days    Sodium  Date Value Ref Range Status  02/14/2023 138 135 - 146 mmol/L Final         Passed - Cr in normal range and within 180 days    Creat  Date Value Ref Range Status  02/14/2023 0.96 0.50 - 1.03 mg/dL Final   Creatinine, Urine  Date Value Ref Range Status  04/10/2022 156 20 - 275 mg/dL Final         Passed - Last BP in normal range    BP Readings from Last 1 Encounters:   02/14/23 136/86

## 2023-03-19 ENCOUNTER — Telehealth: Payer: Self-pay

## 2023-03-19 NOTE — Telephone Encounter (Signed)
Pt called in asking to get a courtesy refill of these meds until her cpe scheduled for 04/28/23. Pt is completely out of these meds.  furosemide (LASIX) 20 MG tablet [[id number]] metoprolol succinate (TOPROL-XL) 50 MG 24 hr tablet [[id number]] rosuvastatin (CRESTOR) 20 MG tablet [[id number]]  LOV: 02/14/23  UPCOMING CPE 04/28/23  PHARMACY:[id number] - [place], [place] - [address] AT [facility] [address], [place] [address] Phone: [phone]  Fax: [phone]    CB#: [phone]

## 2023-03-20 ENCOUNTER — Encounter: Payer: Self-pay | Admitting: Family Medicine

## 2023-03-20 ENCOUNTER — Other Ambulatory Visit: Payer: Self-pay

## 2023-03-20 DIAGNOSIS — I10 Essential (primary) hypertension: Secondary | ICD-10-CM

## 2023-03-20 DIAGNOSIS — E1169 Type 2 diabetes mellitus with other specified complication: Secondary | ICD-10-CM

## 2023-03-20 DIAGNOSIS — E1143 Type 2 diabetes mellitus with diabetic autonomic (poly)neuropathy: Secondary | ICD-10-CM

## 2023-03-20 MED ORDER — FUROSEMIDE 20 MG PO TABS
ORAL_TABLET | ORAL | 1 refills | Status: DC
Start: 2023-03-20 — End: 2023-03-20

## 2023-03-20 MED ORDER — ROSUVASTATIN CALCIUM 20 MG PO TABS: 20.0000 mg | ORAL_TABLET | Freq: Every day | ORAL | 1 refills | Status: DC

## 2023-03-20 MED ORDER — FUROSEMIDE 20 MG PO TABS
20.0000 mg | ORAL_TABLET | Freq: Two times a day (BID) | ORAL | 1 refills | Status: DC
  Filled 2023-10-07: qty 60, 30d supply, fill #0

## 2023-03-20 MED ORDER — METOPROLOL SUCCINATE ER 50 MG PO TB24: 50.0000 mg | ORAL_TABLET | Freq: Every day | ORAL | 1 refills | Status: DC

## 2023-03-28 ENCOUNTER — Other Ambulatory Visit: Payer: Self-pay | Admitting: Family Medicine

## 2023-03-31 NOTE — Telephone Encounter (Signed)
Requested by interface surescripts. Future visit in 4 weeks.  Requested Prescriptions  Pending Prescriptions Disp Refills   metFORMIN (GLUCOPHAGE) 1000 MG tablet [Pharmacy Med Name: METFORMIN 1000MG  TABLET Tablet] 60 tablet 1    Sig: TAKE 1 TABLET BY MOUTH TWICE DAILY WITH A MEAL     Endocrinology:  Diabetes - Biguanides Failed - 03/28/2023  6:49 PM      Failed - B12 Level in normal range and within 720 days    Vitamin B-12  Date Value Ref Range Status  04/10/2022 1,346 (H) 200 - 1,100 pg/mL Final         Failed - Valid encounter within last 6 months    Recent Outpatient Visits           1 year ago Encounter for Medicare annual wellness exam   [facility] Donita Brooks, MD   2 years ago Dysuria   [facility] Donita Brooks, MD   2 years ago RLQ abdominal pain   [facility] Tanya Nones, Priscille Heidelberg, MD   2 years ago Cough   [facility] Donita Brooks, MD   2 years ago Upper respiratory tract infection, unspecified type   [facility] Valentino Nose, NP       Future Appointments             In 4 weeks Tanya Nones, Priscille Heidelberg, MD [facility]            Passed - Cr in normal range and within 360 days    Creat  Date Value Ref Range Status  02/14/2023 0.96 0.50 - 1.03 mg/dL Final   Creatinine, Urine  Date Value Ref Range Status  04/10/2022 156 20 - 275 mg/dL Final         Passed - HBA1C is between 0 and 7.9 and within 180 days    Hgb A1c MFr Bld  Date Value Ref Range Status  01/03/2023 6.9 (H) <5.7 % of total Hgb Final    Comment:    For someone without known diabetes, a hemoglobin A1c value of 6.5% or greater indicates that they may have  diabetes and this should be confirmed with a follow-up  test. . For someone with known diabetes, a value <7% indicates  that their diabetes is well controlled and a value  greater than or equal to 7%  indicates suboptimal  control. A1c targets should be individualized based on  duration of diabetes, age, comorbid conditions, and  other considerations. . Currently, no consensus exists regarding use of hemoglobin A1c for diagnosis of diabetes for children. .          Passed - eGFR in normal range and within 360 days    GFR, Est African American  Date Value Ref Range Status  02/28/2020 114 > OR = 60 mL/min/1.39m2 Final   GFR, Est Non African American  Date Value Ref Range Status  02/28/2020 99 > OR = 60 mL/min/1.74m2 Final   GFR, Estimated  Date Value Ref Range Status  07/12/2020 >60 >60 mL/min Final    Comment:    (NOTE) Calculated using the CKD-EPI Creatinine Equation (2021)    eGFR  Date Value Ref Range Status  02/14/2023 70 > OR = 60 mL/min/1.45m2 Final         Passed - CBC within normal limits and completed in the last 12 months    WBC  Date Value  Ref Range Status  02/14/2023 8.3 3.8 - 10.8 Thousand/uL Final   RBC  Date Value Ref Range Status  02/14/2023 4.39 3.80 - 5.10 Million/uL Final   Hemoglobin  Date Value Ref Range Status  02/14/2023 12.6 11.7 - 15.5 g/dL Final   HCT  Date Value Ref Range Status  02/14/2023 38.3 35.0 - 45.0 % Final   MCHC  Date Value Ref Range Status  02/14/2023 32.9 32.0 - 36.0 g/dL Final    Comment:    For adults, a slight decrease in the calculated MCHC value (in the range of 30 to 32 g/dL) is most likely not clinically significant; however, it should be interpreted with caution in correlation with other red cell parameters and the patient's clinical condition.    [facility]  Date Value Ref Range Status  02/14/2023 28.7 27.0 - 33.0 pg Final   MCV  Date Value Ref Range Status  02/14/2023 87.2 80.0 - 100.0 fL Final   No results found for: "PLTCOUNTKUC", "LABPLAT", "POCPLA" RDW  Date Value Ref Range Status  02/14/2023 12.9 11.0 - 15.0 % Final

## 2023-04-23 ENCOUNTER — Other Ambulatory Visit: Payer: Medicare Other

## 2023-04-23 DIAGNOSIS — E66811 Obesity, class 1: Secondary | ICD-10-CM

## 2023-04-23 DIAGNOSIS — E1143 Type 2 diabetes mellitus with diabetic autonomic (poly)neuropathy: Secondary | ICD-10-CM

## 2023-04-23 DIAGNOSIS — E785 Hyperlipidemia, unspecified: Secondary | ICD-10-CM | POA: Diagnosis not present

## 2023-04-23 DIAGNOSIS — E1169 Type 2 diabetes mellitus with other specified complication: Secondary | ICD-10-CM | POA: Diagnosis not present

## 2023-04-23 DIAGNOSIS — I10 Essential (primary) hypertension: Secondary | ICD-10-CM

## 2023-04-24 LAB — COMPLETE METABOLIC PANEL WITH GFR
AG Ratio: 1.6 (calc) (ref 1.0–2.5)
ALT: 29 U/L (ref 6–29)
AST: 32 U/L (ref 10–35)
Albumin: 4.6 g/dL (ref 3.6–5.1)
Alkaline phosphatase (APISO): 89 U/L (ref 37–153)
BUN: 15 mg/dL (ref 7–25)
CO2: 29 mmol/L (ref 20–32)
Calcium: 10 mg/dL (ref 8.6–10.4)
Chloride: 96 mmol/L — ABNORMAL LOW (ref 98–110)
Creat: 1 mg/dL (ref 0.50–1.03)
Globulin: 2.8 g/dL (ref 1.9–3.7)
Glucose, Bld: 121 mg/dL — ABNORMAL HIGH (ref 65–99)
Potassium: 4.3 mmol/L (ref 3.5–5.3)
Sodium: 140 mmol/L (ref 135–146)
Total Bilirubin: 0.3 mg/dL (ref 0.2–1.2)
Total Protein: 7.4 g/dL (ref 6.1–8.1)
eGFR: 67 mL/min/{1.73_m2} (ref >=60)

## 2023-04-24 LAB — HEMOGLOBIN A1C
Hgb A1c MFr Bld: 7.2 %{Hb} — ABNORMAL HIGH (ref <5.7)
Mean Plasma Glucose: 160 mg/dL
eAG (mmol/L): 8.9 mmol/L

## 2023-04-24 LAB — CBC WITH DIFFERENTIAL/PLATELET
Absolute Lymphocytes: 2719 {cells}/uL (ref 850–3900)
Absolute Monocytes: 515 {cells}/uL (ref 200–950)
Basophils Absolute: 33 {cells}/uL (ref 0–200)
Basophils Relative: 0.5 %
Eosinophils Absolute: 139 {cells}/uL (ref 15–500)
Eosinophils Relative: 2.1 %
HCT: 37.3 % (ref 35.0–45.0)
Hemoglobin: 12 g/dL (ref 11.7–15.5)
MCH: 28.1 pg (ref 27.0–33.0)
MCHC: 32.2 g/dL (ref 32.0–36.0)
MCV: 87.4 fL (ref 80.0–100.0)
MPV: 10.3 fL (ref 7.5–12.5)
Monocytes Relative: 7.8 %
Neutro Abs: 3194 {cells}/uL (ref 1500–7800)
Neutrophils Relative %: 48.4 %
Platelets: 343 10*3/uL (ref 140–400)
RBC: 4.27 10*6/uL (ref 3.80–5.10)
RDW: 13.7 % (ref 11.0–15.0)
Total Lymphocyte: 41.2 %
WBC: 6.6 10*3/uL (ref 3.8–10.8)

## 2023-04-24 LAB — PROTEIN / CREATININE RATIO, URINE
Creatinine, Urine: 11 mg/dL — ABNORMAL LOW (ref 20–275)
Protein/Creat Ratio: 364 mg/g{creat} — ABNORMAL HIGH (ref 24–184)
Protein/Creatinine Ratio: 0.364 mg/mg{creat} — ABNORMAL HIGH (ref 0.024–0.184)
Total Protein, Urine: 4 mg/dL — ABNORMAL LOW (ref 5–24)

## 2023-04-24 LAB — VITAMIN B12: Vitamin B-12: 360 pg/mL (ref 200–1100)

## 2023-04-24 LAB — LIPID PANEL
Cholesterol: 232 mg/dL — ABNORMAL HIGH (ref <200)
HDL: 68 mg/dL (ref >=50)
LDL Cholesterol (Calc): 115 mg/dL — ABNORMAL HIGH
Non-HDL Cholesterol (Calc): 164 mg/dL — ABNORMAL HIGH (ref <130)
Total CHOL/HDL Ratio: 3.4 (calc) (ref <5.0)
Triglycerides: 341 mg/dL — ABNORMAL HIGH (ref <150)

## 2023-04-28 ENCOUNTER — Encounter: Payer: Self-pay | Admitting: Family Medicine

## 2023-04-28 ENCOUNTER — Ambulatory Visit (INDEPENDENT_AMBULATORY_CARE_PROVIDER_SITE_OTHER): Payer: Medicare Other | Admitting: Family Medicine

## 2023-04-28 VITALS — BP 154/102 | HR 107 | Temp 97.9°F | Ht 62.0 in | Wt 187.4 lb

## 2023-04-28 DIAGNOSIS — K760 Fatty (change of) liver, not elsewhere classified: Secondary | ICD-10-CM

## 2023-04-28 DIAGNOSIS — Z0001 Encounter for general adult medical examination with abnormal findings: Secondary | ICD-10-CM

## 2023-04-28 DIAGNOSIS — E1169 Type 2 diabetes mellitus with other specified complication: Secondary | ICD-10-CM

## 2023-04-28 DIAGNOSIS — I10 Essential (primary) hypertension: Secondary | ICD-10-CM

## 2023-04-28 DIAGNOSIS — E785 Hyperlipidemia, unspecified: Secondary | ICD-10-CM

## 2023-04-28 DIAGNOSIS — Z7984 Long term (current) use of oral hypoglycemic drugs: Secondary | ICD-10-CM | POA: Diagnosis not present

## 2023-04-28 DIAGNOSIS — E1143 Type 2 diabetes mellitus with diabetic autonomic (poly)neuropathy: Secondary | ICD-10-CM | POA: Diagnosis not present

## 2023-04-28 DIAGNOSIS — Z0000 Encounter for general adult medical examination without abnormal findings: Secondary | ICD-10-CM

## 2023-04-28 NOTE — Progress Notes (Signed)
Subjective:    Patient ID: Jacqueline [name], female    DOB: 10-15-1965, 57 y.o.   MRN: [id number]  HPI Patient is a very pleasant 57 year old Caucasian female who is here today for complete physical exam.  Patient's last mammogram was January 2024.  This is up-to-date.  Patient's last bone density test was 2022 with a T-score of -2.3.  That is due again 2025 Colonoscopy 2021- clear (recheck 10 years) Pap- s/p TAH/BSO Patient quit taking Ozempic due to constipation.  She is currently managing her sugars with metformin alone.  Her blood pressure today is extremely high.  She states that she is checking it on a daily basis and it typically runs 120s over 80s.  She states that she is compliant with her Crestor however her total cholesterol has risen over 90 points in 4 months.  She is getting a refill on her Crestor next week Lab on 04/23/2023  Component Date Value Ref Range Status   WBC 04/23/2023 6.6  3.8 - 10.8 Thousand/uL Final   RBC 04/23/2023 4.27  3.80 - 5.10 Million/uL Final   Hemoglobin 04/23/2023 12.0  11.7 - 15.5 g/dL Final   HCT 65/78/4696 37.3  35.0 - 45.0 % Final   MCV 04/23/2023 87.4  80.0 - 100.0 fL Final   MCH 04/23/2023 28.1  27.0 - 33.0 pg Final   MCHC 04/23/2023 32.2  32.0 - 36.0 g/dL Final   Comment: For adults, a slight decrease in the calculated MCHC value (in the range of 30 to 32 g/dL) is most likely not clinically significant; however, it should be interpreted with caution in correlation with other red cell parameters and the patient's clinical condition.    RDW 04/23/2023 13.7  11.0 - 15.0 % Final   Platelets 04/23/2023 343  140 - 400 Thousand/uL Final   MPV 04/23/2023 10.3  7.5 - 12.5 fL Final   Neutro Abs 04/23/2023 3,194  1,500 - 7,800 cells/uL Final   Absolute Lymphocytes 04/23/2023 2,719  850 - 3,900 cells/uL Final   Absolute Monocytes 04/23/2023 515  200 - 950 cells/uL Final   Eosinophils Absolute 04/23/2023 139  15 - 500 cells/uL Final   Basophils  Absolute 04/23/2023 33  0 - 200 cells/uL Final   Neutrophils Relative % 04/23/2023 48.4  % Final   Total Lymphocyte 04/23/2023 41.2  % Final   Monocytes Relative 04/23/2023 7.8  % Final   Eosinophils Relative 04/23/2023 2.1  % Final   Basophils Relative 04/23/2023 0.5  % Final   Glucose, Bld 04/23/2023 121 (H)  65 - 99 mg/dL Final   Comment: .            Fasting reference interval . For someone without known diabetes, a glucose value between 100 and 125 mg/dL is consistent with prediabetes and should be confirmed with a follow-up test. .    BUN 04/23/2023 15  7 - 25 mg/dL Final   Creat 29/52/8413 1.00  0.50 - 1.03 mg/dL Final   eGFR 24/40/1027 67  > OR = 60 mL/min/1.60m2 Final   BUN/Creatinine Ratio 04/23/2023 SEE NOTE:  6 - 22 (calc) Final   Comment:    Not Reported: BUN and Creatinine are within    reference range. .    Sodium 04/23/2023 140  135 - 146 mmol/L Final   Potassium 04/23/2023 4.3  3.5 - 5.3 mmol/L Final   Chloride 04/23/2023 96 (L)  98 - 110 mmol/L Final   CO2 04/23/2023 29  20 - 32 mmol/L  Final   Calcium 04/23/2023 10.0  8.6 - 10.4 mg/dL Final   Total Protein 16/02/9603 7.4  6.1 - 8.1 g/dL Final   Albumin 54/01/8118 4.6  3.6 - 5.1 g/dL Final   Globulin 14/78/2956 2.8  1.9 - 3.7 g/dL (calc) Final   AG Ratio 04/23/2023 1.6  1.0 - 2.5 (calc) Final   Total Bilirubin 04/23/2023 0.3  0.2 - 1.2 mg/dL Final   Alkaline phosphatase (APISO) 04/23/2023 89  37 - 153 U/L Final   AST 04/23/2023 32  10 - 35 U/L Final   ALT 04/23/2023 29  6 - 29 U/L Final   Hgb A1c MFr Bld 04/23/2023 7.2 (H)  <5.7 % of total Hgb Final   Comment: For someone without known diabetes, a hemoglobin A1c value of 6.5% or greater indicates that they may have  diabetes and this should be confirmed with a follow-up  test. . For someone with known diabetes, a value <7% indicates  that their diabetes is well controlled and a value  greater than or equal to 7% indicates suboptimal  control. A1c targets  should be individualized based on  duration of diabetes, age, comorbid conditions, and  other considerations. . Currently, no consensus exists regarding use of hemoglobin A1c for diagnosis of diabetes for children. .    Mean Plasma Glucose 04/23/2023 160  mg/dL Final   eAG (mmol/L) 21/30/8657 8.9  mmol/L Final   Cholesterol 04/23/2023 232 (H)  <200 mg/dL Final   HDL 84/69/6295 68  > OR = 50 mg/dL Final   Triglycerides 28/41/3244 341 (H)  <150 mg/dL Final   Comment: . If a non-fasting specimen was collected, consider repeat triglyceride testing on a fasting specimen if clinically indicated.  Perry Mount et al. J. of Clin. Lipidol. 2015;9:129-169. Marland Kitchen    LDL Cholesterol (Calc) 04/23/2023 115 (H)  mg/dL (calc) Final   Comment: Reference range: <100 . Desirable range <100 mg/dL for primary prevention;   <70 mg/dL for patients with CHD or diabetic patients  with > or = 2 CHD risk factors. Marland Kitchen LDL-C is now calculated using the Martin-Hopkins  calculation, which is a validated novel method providing  better accuracy than the Friedewald equation in the  estimation of LDL-C.  Horald Pollen et al. Lenox Ahr. 0102;725(36): 2061-2068  (http://education.[facility].com/faq/FAQ164)    Total CHOL/HDL Ratio 04/23/2023 3.4  <6.4 (calc) Final   Non-HDL Cholesterol (Calc) 04/23/2023 164 (H)  <130 mg/dL (calc) Final   Comment: For patients with diabetes plus 1 major ASCVD risk  factor, treating to a non-HDL-C goal of <100 mg/dL  (LDL-C of <40 mg/dL) is considered a therapeutic  option.    Vitamin B-12 04/23/2023 360  200 - 1,100 pg/mL Final   Comment: . Please Note: Although the reference range for vitamin B12 is [phone] pg/mL, it has been reported that between 5 and 10% of patients with values between 200 and 400 pg/mL may experience neuropsychiatric and hematologic abnormalities due to occult B12 deficiency; less than 1% of patients with values above 400 pg/mL will have symptoms. .     Creatinine, Urine 04/23/2023 11 (L)  20 - 275 mg/dL Final   Protein/Creat Ratio 04/23/2023 364 (H)  24 - 184 mg/g creat Final   Protein/Creatinine Ratio 04/23/2023 0.364 (H)  0.024 - 0.184 mg/mg creat Final   Total Protein, Urine 04/23/2023 4 (L)  5 - 24 mg/dL Final    Immunization History  Administered Date(s) Administered   Influenza, Seasonal, Injecte, Preservative Fre 01/21/2023   Influenza,inj,Quad PF,6+  Mos 02/02/2016, 04/29/2017, 02/06/2018, 02/26/2019, 03/02/2020, 02/21/2021, 03/06/2022   PFIZER(Purple Top)SARS-COV-2 Vaccination 07/31/2019, 08/21/2019, 02/24/2020, 11/24/2020   PNEUMOCOCCAL CONJUGATE-20 02/07/2022   Pfizer Covid-19 Vaccine Bivalent Booster 59yrs & up 03/08/2021   Pfizer(Comirnaty)Fall Seasonal Vaccine 12 years and older 03/06/2022, 01/21/2023   Pneumococcal Polysaccharide-23 10/24/2014, 02/21/2021   Tdap 12/17/2011, 02/07/2022   Zoster Recombinant(Shingrix) 03/30/2021, 07/06/2021     Past Medical History:  Diagnosis Date   Abnormal transaminases    Allergy    [facility]reesia 03/01/2020   Anemia    Anxiety disorder    Arthritis    Asthma    Cancer (HCC)    [facility] 03/01/2020   Cellulitis    Chronic headaches    Diabetes mellitus without complication (HCC)    [facility] 03/01/2020   Diverticulitis    DM (diabetes mellitus) (HCC)    Erosive esophagitis    Fatty liver    GERD (gastroesophageal reflux disease)    Hyperlipidemia    [facility] 03/01/2020   IBS (irritable bowel syndrome)    Kidney stones    Neuromuscular disorder (HCC)    [facility]ia 03/01/2020   Obesity    Pneumonia    Pseudotumor cerebri    Sinus tachycardia    Past Surgical History:  Procedure Laterality Date   ABDOMINAL HYSTERECTOMY N/A    [facility] 03/01/2020   ANKLE SURGERY     COLONOSCOPY  06/19/2009   COLONOSCOPY WITH PROPOFOL N/A 03/29/2020   Procedure: COLONOSCOPY WITH PROPOFOL;  Surgeon: Dolores Frame, MD;  Location: [facility];  Service: Gastroenterology;   Laterality: N/A;  9:00   EGD  06/09/2009   FOOT SURGERY     NASAL SEPTUM SURGERY     ovarian cysts     x3   TONSILLECTOMY     TUBAL LIGATION     VAGINAL HYSTERECTOMY     Current Outpatient Medications on File Prior to Visit  Medication Sig Dispense Refill   aspirin EC 81 MG tablet Take 81 mg by mouth daily.     blood glucose meter kit and supplies KIT Dispense based on patient and insurance preference. Use up to four times daily as directed. 1 each 0   Blood Glucose Monitoring Suppl (BLOOD GLUCOSE SYSTEM PAK) KIT Please dispense based on patient and insurance preference. Use as directed to monitor FSBS 2x daily. Dx: E11.9. 1 kit 1   cetirizine (ZYRTEC) 10 MG tablet Take 10 mg by mouth daily.     DULoxetine (CYMBALTA) 60 MG capsule TAKE TWO CAPSULES BY MOUTH ONCE DAILY 60 capsule 2   EPINEPHrine 0.3 mg/0.3 mL IJ SOAJ injection Inject 0.3 mg into the muscle as needed for anaphylaxis. 1 each 0   Flaxseed, Linseed, (FLAXSEED OIL PO) Take 1 capsule by mouth daily.     furosemide (LASIX) 20 MG tablet TAKE ONE TABLET BY MOUTH TWICE DAILY 180 tablet 1   gabapentin (NEURONTIN) 600 MG tablet TAKE ONE-HALF TO TWO TABLETS BY MOUTH THREE TIMES DAILY AS NEEDED 540 tablet 3   glucose blood (ACCU-CHEK GUIDE) test strip USE AS DIRECTED TWICE DAILY TO check blood sugar 100 strip 11   Lancets (ONETOUCH DELICA PLUS LANCET33G) MISC 1 each by Does not apply route 2 (two) times daily. 200 each 11   metFORMIN (GLUCOPHAGE) 1000 MG tablet TAKE 1 TABLET BY MOUTH TWICE DAILY WITH A MEAL 60 tablet 1   metoprolol succinate (TOPROL-XL) 50 MG 24 hr tablet Take 1 tablet (50 mg total) by mouth daily. Take with or immediately following a meal. 90  tablet 1   oxcarbazepine (TRILEPTAL) 600 MG tablet TAKE ONE TABLET BY MOUTH ONCE DAILY 90 tablet 3   pantoprazole (PROTONIX) 40 MG tablet TAKE 1 TABLET BY MOUTH ONCE DAILY *REFILL REQUEST* 90 tablet 0   Polyethyl Glycol-Propyl Glycol (SYSTANE OP) Place 1 drop into both eyes daily as  needed (dry eyes).     rosuvastatin (CRESTOR) 20 MG tablet Take 1 tablet (20 mg total) by mouth daily. 90 tablet 1   triamcinolone cream (KENALOG) 0.1 % APPLY TO AFFECTED AREA(S) TWICE DAILY AS NEEDED FOR ECZEMA 80 g 10   valACYclovir (VALTREX) 500 MG tablet TAKE ONE TABLET BY MOUTH twice daily AS NEEDED FOR FEVER blisters 6 tablet 3   carisoprodol (SOMA) 350 MG tablet Take 1 tablet (350 mg total) by mouth 4 (four) times daily as needed for muscle spasms. 30 tablet 0   COVID-19 mRNA vaccine 2023-2024 (COMIRNATY) syringe Inject into the muscle. (Patient not taking: Reported on 04/28/2023) 0.3 mL 0   fluticasone (FLONASE) 50 MCG/ACT nasal spray shake liquid AND USE TWO SPRAYS in each nostril DAILY 16 g 6   influenza vac split quadrivalent PF (FLUARIX) 0.5 ML injection Inject into the muscle. (Patient not taking: Reported on 04/28/2023) 0.5 mL 0   oxyCODONE (ROXICODONE) 5 MG immediate release tablet Take 1 tablet (5 mg total) by mouth every 4 (four) hours as needed for severe pain. 6 tablet 0   promethazine (PHENERGAN) 25 MG tablet TAKE 1 TABLET BY MOUTH EVERY 6 HOURS AS NEEDED FOR NAUSEA & VOMITING *REFILL REQUEST* (Patient not taking: Reported on 04/28/2023) 30 tablet 10   No current facility-administered medications on file prior to visit.   Allergies  Allergen Reactions   Bee Venom Anaphylaxis   Aspirin Hives, Itching and Swelling   Metoclopramide Hives   Codeine     Increases liver enzymes    Eql Antibacterial Hand Soap [Benzalkonium Chloride] Hives   Nsaids     Hallucinations    Social History   Socioeconomic History   Marital status: Married    Spouse name: Charles   Number of children: 3   Years of education: Not on file   Highest education level: GED or equivalent  Occupational History   Occupation: Disabled  Tobacco Use   Smoking status: Former   Smokeless tobacco: Never  Advertising account planner   Vaping status: Never Used  Substance and Sexual Activity   Alcohol use: No   Drug  use: No   Sexual activity: Yes    Birth control/protection: Surgical  Other Topics Concern   Not on file  Social History Narrative   Lives with husband and children.    Social Drivers of Corporate investment banker Strain: Low Risk  (02/12/2023)   Overall Financial Resource Strain (CARDIA)    Difficulty of Paying Living Expenses: Not very hard  Food Insecurity: No Food Insecurity (02/12/2023)   Hunger Vital Sign    Worried About Running Out of Food in the Last Year: Never true    Ran Out of Food in the Last Year: Never true  Transportation Needs: No Transportation Needs (02/12/2023)   PRAPARE - Administrator, Civil Service (Medical): No    Lack of Transportation (Non-Medical): No  Physical Activity: Unknown (02/12/2023)   Exercise Vital Sign    Days of Exercise per Week: Patient declined    Minutes of Exercise per Session: 30 min  Stress: No Stress Concern Present (02/12/2023)   [employer] of Occupational Health -  Occupational Stress Questionnaire    Feeling of Stress : Only a little  Social Connections: Socially Integrated (02/12/2023)   Social Connection and Isolation Panel [NHANES]    Frequency of Communication with Friends and Family: More than three times a week    Frequency of Social Gatherings with Friends and Family: Once a week    Attends Religious Services: 1 to 4 times per year    Active Member of [employer] or Organizations: No    Attends Engineer, structural: More than 4 times per year    Marital Status: Married  Catering manager Violence: Not At Risk (05/22/2022)   Humiliation, Afraid, Rape, and Kick questionnaire    Fear of Current or Ex-Partner: No    Emotionally Abused: No    Physically Abused: No    Sexually Abused: No   Family History  Problem Relation Age of Onset   Diabetes Mother    Diabetes Maternal Aunt    Diabetes Son    Colitis Other    Crohn's disease Other    Breast cancer Maternal Aunt    Ovarian cancer Other    Celiac  disease Other    Clotting disorder Other    Cystic fibrosis Other        pat. cousin   Heart disease Other    Irritable bowel syndrome Other    Kidney failure Other 31       mat. nephew   Diabetes Other        mat. nephew   Colon cancer Neg Hx       Review of Systems  All other systems reviewed and are negative.      Objective:   Physical Exam Vitals reviewed.  Constitutional:      General: She is not in acute distress.    Appearance: She is well-developed. She is not diaphoretic.  HENT:     Head: Normocephalic and atraumatic.     Right Ear: External ear normal.     Left Ear: External ear normal.     Nose: Nose normal.     Mouth/Throat:     Pharynx: No oropharyngeal exudate.  Eyes:     General: No scleral icterus.       Right eye: No discharge.        Left eye: No discharge.     Conjunctiva/sclera: Conjunctivae normal.     Pupils: Pupils are equal, round, and reactive to light.  Neck:     Thyroid: No thyromegaly.     Vascular: No JVD.  Cardiovascular:     Rate and Rhythm: Normal rate and regular rhythm.     Heart sounds: Normal heart sounds. No murmur heard.    No friction rub. No gallop.  Pulmonary:     Effort: Pulmonary effort is normal. No respiratory distress.     Breath sounds: Normal breath sounds. No stridor. No wheezing or rales.  Chest:     Chest wall: No tenderness.  Abdominal:     General: Bowel sounds are normal. There is no distension.     Palpations: Abdomen is soft. There is no mass.     Tenderness: There is no abdominal tenderness. There is no guarding or rebound.  Musculoskeletal:        General: No tenderness. Normal range of motion.     Cervical back: Normal range of motion and neck supple.  Lymphadenopathy:     Cervical: No cervical adenopathy.  Skin:    General: Skin is warm.  Coloration: Skin is not pale.     Findings: No erythema or rash.  Neurological:     Mental Status: She is alert and oriented to person, place, and time.      Cranial Nerves: No cranial nerve deficit.     Motor: No abnormal muscle tone.     Coordination: Coordination normal.     Deep Tendon Reflexes: Reflexes are normal and symmetric.  Psychiatric:        Behavior: Behavior normal.        Thought Content: Thought content normal.        Judgment: Judgment normal.           Assessment & Plan:  Type 2 diabetes mellitus with diabetic autonomic neuropathy, without long-term current use of insulin (HCC)  Dyslipidemia associated with type 2 diabetes mellitus (HCC)  Essential hypertension, benign  NAFLD (nonalcoholic fatty liver disease)  General medical exam Patient's physical exam today is significant for an elevated blood pressure.  Patient believes that this is fictitiously elevated.  Therefore I have asked her to monitor her blood pressure at home over the next week and notify me of the values in 1 week.  If greater than 140/90 we will uptitrate her medication.  Her hemoglobin A1c is elevated at 7.2.  I have recommended resuming Trulicity or trying stepdown as the patient could not tolerate Ozempic.  Patient states she wants to try diet and exercise and recheck labs in 3 months.  If elevated at that point she will try Zepbound.  Cholesterol is extremely high and has changed remarkably since August.  I believe that her medication is incorrect.  I question if she is taking Crestor.  I asked her to get a refill on his medication immediately and start taking this.  We will recheck cholesterol in 3 months.  If still elevated to the point of this today I would recommend adding Zetia.  Cancer screening is up-to-date.  Immunizations are up-to-date.  Diabetic foot exam was performed today and was normal aside from neuropathy in her feet and diminished sensation to 10 g monofilament

## 2023-05-01 ENCOUNTER — Encounter: Payer: Self-pay | Admitting: Family Medicine

## 2023-05-02 ENCOUNTER — Other Ambulatory Visit: Payer: Self-pay | Admitting: Family Medicine

## 2023-05-02 DIAGNOSIS — I10 Essential (primary) hypertension: Secondary | ICD-10-CM

## 2023-05-02 MED ORDER — METOPROLOL SUCCINATE ER 50 MG PO TB24
50.0000 mg | ORAL_TABLET | Freq: Every day | ORAL | 1 refills | Status: DC
Start: 2023-05-02 — End: 2023-10-13

## 2023-05-09 ENCOUNTER — Encounter: Payer: Self-pay | Admitting: Family Medicine

## 2023-05-11 ENCOUNTER — Ambulatory Visit
Admission: RE | Admit: 2023-05-11 | Discharge: 2023-05-11 | Disposition: A | Discharge status: Home / Self Care | Payer: Medicare Other | Source: Ambulatory Visit | Attending: Family Medicine | Admitting: Family Medicine

## 2023-05-11 VITALS — BP 110/79 | HR 97 | Temp 98.3°F | Resp 18

## 2023-05-11 DIAGNOSIS — R062 Wheezing: Secondary | ICD-10-CM

## 2023-05-11 DIAGNOSIS — J4521 Mild intermittent asthma with (acute) exacerbation: Secondary | ICD-10-CM

## 2023-05-11 DIAGNOSIS — J069 Acute upper respiratory infection, unspecified: Secondary | ICD-10-CM

## 2023-05-11 LAB — POCT INFLUENZA A/B
Influenza A, POC: NEGATIVE
Influenza B, POC: NEGATIVE

## 2023-05-11 MED ORDER — DEXAMETHASONE SODIUM PHOSPHATE 10 MG/ML IJ SOLN
10.0000 mg | Freq: Once | INTRAMUSCULAR | Status: AC
  Administered 2023-05-11: 10 mg via INTRAMUSCULAR

## 2023-05-11 MED ORDER — IPRATROPIUM-ALBUTEROL 0.5-2.5 (3) MG/3ML IN SOLN
3.0000 mL | Freq: Once | RESPIRATORY_TRACT | Status: AC
  Administered 2023-05-11: 3 mL via RESPIRATORY_TRACT

## 2023-05-11 MED ORDER — ALBUTEROL SULFATE HFA 108 (90 BASE) MCG/ACT IN AERS: 2.0000 | INHALATION_SPRAY | RESPIRATORY_TRACT | 0 refills | Status: DC | PRN

## 2023-05-11 NOTE — Discharge Instructions (Signed)
I have sent over an albuterol inhaler to be used as needed and we have given you a steroid shot today.  You may also use over-the-counter nasal sprays, sinus rinses, humidifiers, Delsym, plain Mucinex, antihistamines as needed.  Return for worsening symptoms.

## 2023-05-11 NOTE — ED Triage Notes (Signed)
Pt reports she has a sore throat, headache, sinus pressure, bilateral ear pain, hurts to cough, body aches, and fatigue x 2 days

## 2023-05-19 ENCOUNTER — Telehealth: Payer: Self-pay

## 2023-05-19 NOTE — Telephone Encounter (Signed)
 Copied from CRM [id number]. Topic: Clinical - Medical Advice >> May 19, 2023 10:31 AM Graeme ORN wrote: Reason for CRM: Patient called states she was recently seen at urgent care for upper respiratory infection. Was told to follow up with provider if she ran out of medication or was not feeling better. Patient states she is still feeling about the same and would like someone to give her calls to discuss options. Thank You.

## 2023-05-22 ENCOUNTER — Ambulatory Visit (INDEPENDENT_AMBULATORY_CARE_PROVIDER_SITE_OTHER): Payer: PPO | Admitting: Family Medicine

## 2023-05-22 ENCOUNTER — Encounter: Payer: Self-pay | Admitting: Family Medicine

## 2023-05-22 VITALS — BP 130/76 | HR 99 | Temp 98.6°F | Ht 62.0 in | Wt 190.8 lb

## 2023-05-22 DIAGNOSIS — J069 Acute upper respiratory infection, unspecified: Secondary | ICD-10-CM

## 2023-05-22 MED ORDER — HYDROCODONE BIT-HOMATROP MBR 5-1.5 MG/5ML PO SOLN: 5.0000 mL | Freq: Three times a day (TID) | ORAL | 0 refills | Status: DC | PRN

## 2023-05-22 NOTE — Progress Notes (Signed)
 Subjective:    Patient ID: Jacqueline [name], female    DOB: 05-29-1965, 58 y.o.   MRN: [id number]  Patient has had cough and chest congestion for almost 2 weeks.  She denies any fever.  She denies any shortness of breath.  Was seen in urgent care and was given an albuterol  inhaler and steroid shot.  She states that she is not doing any better.  Today her lungs are completely clear to auscultation bilaterally.  She does occasionally have clear mucus that she coughs up per her report.  She denies any otalgia or sinus pain or sore throat.  However the majority the time the cough is nonproductive. Past Medical History:  Diagnosis Date   Abnormal transaminases    Allergy    [employer]reesia 03/01/2020   Anemia    Anxiety disorder    Arthritis    Asthma    Cancer (HCC)    [employer] 03/01/2020   Cellulitis    Chronic headaches    Diabetes mellitus without complication (HCC)    [employer] 03/01/2020   Diverticulitis    DM (diabetes mellitus) (HCC)    Erosive esophagitis    Fatty liver    GERD (gastroesophageal reflux disease)    Hyperlipidemia    [employer] 03/01/2020   IBS (irritable bowel syndrome)    Kidney stones    Neuromuscular disorder (HCC)    [employer]ia 03/01/2020   Obesity    Pneumonia    Pseudotumor cerebri    Sinus tachycardia    Past Surgical History:  Procedure Laterality Date   ABDOMINAL HYSTERECTOMY N/A    [employer]ia 03/01/2020   ANKLE SURGERY     COLONOSCOPY  06/19/2009   COLONOSCOPY WITH PROPOFOL  N/A 03/29/2020   Procedure: COLONOSCOPY WITH PROPOFOL ;  Surgeon: Eartha Angelia Sieving, MD;  [facility]: [facility];  Service: Gastroenterology;  Laterality: N/A;  9:00   EGD  06/09/2009   FOOT SURGERY     NASAL SEPTUM SURGERY     ovarian cysts     x3   TONSILLECTOMY     TUBAL LIGATION     VAGINAL HYSTERECTOMY     Current Outpatient Medications on File Prior to Visit  Medication Sig Dispense Refill   albuterol  (VENTOLIN  HFA) 108 (90 Base) MCG/ACT inhaler Inhale 2 puffs into  the lungs every 4 (four) hours as needed. 18 g 0   aspirin EC 81 MG tablet Take 81 mg by mouth daily.     blood glucose meter kit and supplies KIT Dispense based on patient and insurance preference. Use up to four times daily as directed. 1 each 0   Blood Glucose Monitoring Suppl (BLOOD GLUCOSE SYSTEM PAK) KIT Please dispense based on patient and insurance preference. Use as directed to monitor FSBS 2x daily. Dx: E11.9. 1 kit 1   carisoprodol  (SOMA ) 350 MG tablet Take 1 tablet (350 mg total) by mouth 4 (four) times daily as needed for muscle spasms. 30 tablet 0   cetirizine (ZYRTEC) 10 MG tablet Take 10 mg by mouth daily.     COVID-19 mRNA vaccine 2023-2024 (COMIRNATY ) syringe Inject into the muscle. (Patient not taking: Reported on 04/28/2023) 0.3 mL 0   DULoxetine  (CYMBALTA ) 60 MG capsule TAKE TWO CAPSULES BY MOUTH ONCE DAILY 60 capsule 2   EPINEPHrine  0.3 mg/0.3 mL IJ SOAJ injection Inject 0.3 mg into the muscle as needed for anaphylaxis. 1 each 0   Flaxseed, Linseed, (FLAXSEED OIL PO) Take 1 capsule by mouth daily.     fluticasone  (FLONASE )  50 MCG/ACT nasal spray shake liquid AND USE TWO SPRAYS in each nostril DAILY 16 g 6   furosemide  (LASIX ) 20 MG tablet TAKE ONE TABLET BY MOUTH TWICE DAILY 180 tablet 1   gabapentin  (NEURONTIN ) 600 MG tablet TAKE ONE-HALF TO TWO TABLETS BY MOUTH THREE TIMES DAILY AS NEEDED 540 tablet 3   glucose blood (ACCU-CHEK GUIDE) test strip USE AS DIRECTED TWICE DAILY TO check blood sugar 100 strip 11   influenza vac split quadrivalent PF (FLUARIX) 0.5 ML injection Inject into the muscle. (Patient not taking: Reported on 04/28/2023) 0.5 mL 0   Lancets (ONETOUCH DELICA PLUS LANCET33G) MISC 1 each by Does not apply route 2 (two) times daily. 200 each 11   metFORMIN  (GLUCOPHAGE ) 1000 MG tablet TAKE 1 TABLET BY MOUTH TWICE DAILY WITH A MEAL 60 tablet 1   metoprolol  succinate (TOPROL -XL) 50 MG 24 hr tablet Take 1 tablet (50 mg total) by mouth daily. Take with or immediately  following a meal. 90 tablet 1   oxcarbazepine  (TRILEPTAL ) 600 MG tablet TAKE ONE TABLET BY MOUTH ONCE DAILY 90 tablet 3   oxyCODONE  (ROXICODONE ) 5 MG immediate release tablet Take 1 tablet (5 mg total) by mouth every 4 (four) hours as needed for severe pain. 6 tablet 0   pantoprazole  (PROTONIX ) 40 MG tablet TAKE 1 TABLET BY MOUTH ONCE DAILY *REFILL REQUEST* 90 tablet 0   Polyethyl Glycol-Propyl Glycol (SYSTANE OP) Place 1 drop into both eyes daily as needed (dry eyes).     promethazine  (PHENERGAN ) 25 MG tablet TAKE 1 TABLET BY MOUTH EVERY 6 HOURS AS NEEDED FOR NAUSEA & VOMITING *REFILL REQUEST* (Patient not taking: Reported on 04/28/2023) 30 tablet 10   rosuvastatin  (CRESTOR ) 20 MG tablet Take 1 tablet (20 mg total) by mouth daily. 90 tablet 1   triamcinolone  cream (KENALOG ) 0.1 % APPLY TO AFFECTED AREA(S) TWICE DAILY AS NEEDED FOR ECZEMA 80 g 10   valACYclovir  (VALTREX ) 500 MG tablet TAKE ONE TABLET BY MOUTH twice daily AS NEEDED FOR FEVER blisters 6 tablet 3   No current facility-administered medications on file prior to visit.   Allergies  Allergen Reactions   Bee Venom Anaphylaxis   Aspirin Hives, Itching and Swelling   Metoclopramide Hives   Codeine     Increases liver enzymes    Eql Antibacterial Hand Soap [Benzalkonium Chloride] Hives   Nsaids     Hallucinations    Social History   Socioeconomic History   Marital status: Married    Spouse name: Charles   Number of children: 3   Years of education: Not on file   Highest education level: GED or equivalent  Occupational History   Occupation: Disabled  Tobacco Use   Smoking status: Former   Smokeless tobacco: Never  Advertising Account Planner   Vaping status: Never Used  Substance and Sexual Activity   Alcohol use: No   Drug use: No   Sexual activity: Yes    Birth control/protection: Surgical  Other Topics Concern   Not on file  Social History Narrative   Lives with husband and children.    Social Drivers of Research Scientist (physical Sciences) Strain: Low Risk  (02/12/2023)   Overall Financial Resource Strain (CARDIA)    Difficulty of Paying Living Expenses: Not very hard  Food Insecurity: No Food Insecurity (02/12/2023)   Hunger Vital Sign    Worried About Running Out of Food in the Last Year: Never true    Ran Out of Food in the Last  Year: Never true  Transportation Needs: No Transportation Needs (02/12/2023)   PRAPARE - Administrator, Civil Service (Medical): No    Lack of Transportation (Non-Medical): No  Physical Activity: Unknown (02/12/2023)   Exercise Vital Sign    Days of Exercise per Week: Patient declined    Minutes of Exercise per Session: 30 min  Stress: No Stress Concern Present (02/12/2023)   [employer] of Occupational Health - Occupational Stress Questionnaire    Feeling of Stress : Only a little  Social Connections: Socially Integrated (02/12/2023)   Social Connection and Isolation Panel [NHANES]    Frequency of Communication with Friends and Family: More than three times a week    Frequency of Social Gatherings with Friends and Family: Once a week    Attends Religious Services: 1 to 4 times per year    Active Member of [employer] or Organizations: No    Attends Engineer, Structural: More than 4 times per year    Marital Status: Married  Catering Manager Violence: Not At Risk (05/22/2022)   Humiliation, Afraid, Rape, and Kick questionnaire    Fear of Current or Ex-Partner: No    Emotionally Abused: No    Physically Abused: No    Sexually Abused: No     Review of Systems  All other systems reviewed and are negative.      Objective:   Physical Exam Vitals reviewed.  Constitutional:      General: She is not in acute distress.    Appearance: She is obese. She is not ill-appearing, toxic-appearing or diaphoretic.  HENT:     Right Ear: Tympanic membrane and ear canal normal.     Left Ear: Tympanic membrane and ear canal normal.     Nose: Rhinorrhea present. No congestion.      Mouth/Throat:     Pharynx: No oropharyngeal exudate or posterior oropharyngeal erythema.  Eyes:     Conjunctiva/sclera: Conjunctivae normal.  Cardiovascular:     Rate and Rhythm: Normal rate and regular rhythm.     Heart sounds: No murmur heard.    No friction rub.  Pulmonary:     Effort: Pulmonary effort is normal. No respiratory distress.     Breath sounds: Normal breath sounds. No stridor. No wheezing, rhonchi or rales.  Chest:     Chest wall: No tenderness.  Abdominal:     General: Bowel sounds are normal. There is no distension.     Palpations: Abdomen is soft.     Tenderness: There is no abdominal tenderness. There is no guarding or rebound.  Lymphadenopathy:     Cervical: No cervical adenopathy.  Skin:    Findings: Rash is not macular, papular, pustular, scaling, urticarial or vesicular.  Neurological:     Mental Status: She is alert.           Assessment & Plan:  Viral URI Patient has a viral URI.  I will give her Hycodan 1 teaspoon every 8 hours for cough.  I recommended tincture of time.  Symptoms are slowly improving.  No evidence of pneumonia, sinusitis, or bronchitis on exam today.

## 2023-05-24 ENCOUNTER — Other Ambulatory Visit: Payer: Self-pay | Admitting: Family Medicine

## 2023-06-01 ENCOUNTER — Other Ambulatory Visit: Payer: Self-pay | Admitting: Family Medicine

## 2023-06-06 ENCOUNTER — Other Ambulatory Visit: Payer: Self-pay | Admitting: Family Medicine

## 2023-06-16 ENCOUNTER — Other Ambulatory Visit (HOSPITAL_BASED_OUTPATIENT_CLINIC_OR_DEPARTMENT_OTHER): Payer: Self-pay

## 2023-06-30 ENCOUNTER — Other Ambulatory Visit: Payer: Self-pay | Admitting: Family Medicine

## 2023-06-30 DIAGNOSIS — K219 Gastro-esophageal reflux disease without esophagitis: Secondary | ICD-10-CM

## 2023-07-01 NOTE — Telephone Encounter (Signed)
 Last OV 04/28/23 Requested Prescriptions  Pending Prescriptions Disp Refills   pantoprazole (PROTONIX) 40 MG tablet [Pharmacy Med Name: PANTOPRAZOLE DR 40MG  TAB 40 Tablet] 30 tablet 10    Sig: TAKE 1 TABLET BY MOUTH ONCE DAILY     Gastroenterology: Proton Pump Inhibitors Failed - 07/01/2023  9:59 AM      Failed - Valid encounter within last 12 months    Recent Outpatient Visits           2 years ago Encounter for Medicare annual wellness exam   [facility] Donita Brooks, MD   2 years ago Dysuria   [facility] Donita Brooks, MD   2 years ago RLQ abdominal pain   [facility] Tanya Nones, Priscille Heidelberg, MD   3 years ago Cough   [facility] Tanya Nones Priscille Heidelberg, MD   3 years ago Upper respiratory tract infection, unspecified type   [facility] Valentino Nose, NP

## 2023-07-21 ENCOUNTER — Other Ambulatory Visit: Payer: Self-pay | Admitting: Family Medicine

## 2023-07-21 DIAGNOSIS — Z1231 Encounter for screening mammogram for malignant neoplasm of breast: Secondary | ICD-10-CM

## 2023-08-04 ENCOUNTER — Ambulatory Visit

## 2023-08-06 ENCOUNTER — Encounter: Payer: Self-pay | Admitting: Family Medicine

## 2023-08-06 ENCOUNTER — Other Ambulatory Visit: Payer: Self-pay | Admitting: Family Medicine

## 2023-08-06 ENCOUNTER — Other Ambulatory Visit: Payer: Self-pay

## 2023-08-06 MED ORDER — VALACYCLOVIR HCL 500 MG PO TABS
500.0000 mg | ORAL_TABLET | Freq: Two times a day (BID) | ORAL | 3 refills | Status: DC | PRN
  Filled 2023-09-16: qty 6, 3d supply, fill #0
  Filled 2023-10-07: qty 6, 3d supply, fill #1
  Filled 2023-10-31: qty 6, 3d supply, fill #2
  Filled 2023-12-29 (×2): qty 6, 3d supply, fill #3

## 2023-08-07 NOTE — Telephone Encounter (Signed)
 Rx 08/06/23 #6 3RF- duplictae request Requested Prescriptions  Pending Prescriptions Disp Refills   valACYclovir (VALTREX) 500 MG tablet [Pharmacy Med Name: VALACYCLOVIR 500 MG TAB 500 Tablet] 6 tablet 10    Sig: TAKE 1 TABLET BY MOUTH TWICE DAILY AS NEEDED FOR FEVER BLISTERS     Antimicrobials:  Antiviral Agents - Anti-Herpetic Passed - 08/07/2023  3:50 PM      Passed - Valid encounter within last 12 months    Recent Outpatient Visits           2 months ago Viral URI   [facility] Donita Brooks, MD   3 months ago Type 2 diabetes mellitus with diabetic autonomic neuropathy, without long-term current use of insulin [facility])   [place] [facility] Pickard, Priscille Heidelberg, MD   5 months ago Flank pain   [place] [facility] Donita Brooks, MD   11 months ago Nausea and vomiting, unspecified vomiting type   [place] [facility] Donita Brooks, MD   1 year ago Viral pharyngitis   [place] [facility] Park Meo, FNP

## 2023-08-20 ENCOUNTER — Ambulatory Visit
Admission: RE | Admit: 2023-08-20 | Discharge: 2023-08-20 | Disposition: A | Discharge status: Home / Self Care | Source: Ambulatory Visit | Attending: Family Medicine | Admitting: Family Medicine

## 2023-08-20 DIAGNOSIS — Z1231 Encounter for screening mammogram for malignant neoplasm of breast: Secondary | ICD-10-CM | POA: Diagnosis not present

## 2023-08-25 DIAGNOSIS — I10 Essential (primary) hypertension: Secondary | ICD-10-CM | POA: Diagnosis not present

## 2023-08-25 DIAGNOSIS — E119 Type 2 diabetes mellitus without complications: Secondary | ICD-10-CM | POA: Diagnosis not present

## 2023-08-25 DIAGNOSIS — G932 Benign intracranial hypertension: Secondary | ICD-10-CM | POA: Diagnosis not present

## 2023-08-25 DIAGNOSIS — Z961 Presence of intraocular lens: Secondary | ICD-10-CM | POA: Diagnosis not present

## 2023-08-27 ENCOUNTER — Other Ambulatory Visit: Payer: Medicare Other

## 2023-08-27 ENCOUNTER — Other Ambulatory Visit: Payer: Self-pay | Admitting: Family Medicine

## 2023-08-27 DIAGNOSIS — K760 Fatty (change of) liver, not elsewhere classified: Secondary | ICD-10-CM

## 2023-08-27 DIAGNOSIS — I10 Essential (primary) hypertension: Secondary | ICD-10-CM

## 2023-08-27 DIAGNOSIS — D649 Anemia, unspecified: Secondary | ICD-10-CM | POA: Diagnosis not present

## 2023-08-27 DIAGNOSIS — E1143 Type 2 diabetes mellitus with diabetic autonomic (poly)neuropathy: Secondary | ICD-10-CM | POA: Diagnosis not present

## 2023-08-27 DIAGNOSIS — E1169 Type 2 diabetes mellitus with other specified complication: Secondary | ICD-10-CM

## 2023-08-27 DIAGNOSIS — E785 Hyperlipidemia, unspecified: Secondary | ICD-10-CM | POA: Diagnosis not present

## 2023-08-28 NOTE — Telephone Encounter (Signed)
 Requested Prescriptions  Pending Prescriptions Disp Refills   rosuvastatin (CRESTOR) 20 MG tablet [Pharmacy Med Name: ROSUVASTATIN 20MG  TAB 20 Tablet] 90 tablet 1    Sig: TAKE 1 TABLET BY MOUTH ONCE DAILY     Cardiovascular:  Antilipid - Statins 2 Failed - 08/28/2023  5:23 PM      Failed - Lipid Panel in normal range within the last 12 months    Cholesterol  Date Value Ref Range Status  08/27/2023 183 <200 mg/dL Final   LDL Cholesterol (Calc)  Date Value Ref Range Status  08/27/2023 97 mg/dL (calc) Final    Comment:    Reference range: <100 . Desirable range <100 mg/dL for primary prevention;   <70 mg/dL for patients with CHD or diabetic patients  with > or = 2 CHD risk factors. Marland Kitchen LDL-C is now calculated using the Martin-Hopkins  calculation, which is a validated novel method providing  better accuracy than the Friedewald equation in the  estimation of LDL-C.  Horald Pollen et al. Lenox Ahr. 4098;119(14): 2061-2068  (http://education.[facility].com/faq/FAQ164)    HDL  Date Value Ref Range Status  08/27/2023 48 (L) > OR = 50 mg/dL Final   Triglycerides  Date Value Ref Range Status  08/27/2023 265 (H) <150 mg/dL Final    Comment:    . If a non-fasting specimen was collected, consider repeat triglyceride testing on a fasting specimen if clinically indicated.  Perry Mount et al. J. of Clin. Lipidol. 2015;9:129-169. Marland Kitchen          Passed - Cr in normal range and within 360 days    Creat  Date Value Ref Range Status  08/27/2023 0.87 0.50 - 1.03 mg/dL Final   Creatinine, Urine  Date Value Ref Range Status  04/23/2023 11 (L) 20 - 275 mg/dL Final         Passed - Patient is not pregnant      Passed - Valid encounter within last 12 months    Recent Outpatient Visits           3 months ago Viral URI   [place] [facility] Donita Brooks, MD   4 months ago Type 2 diabetes mellitus with diabetic autonomic neuropathy, without long-term current use of  insulin [facility])   [place] [facility] Pickard, Priscille Heidelberg, MD   6 months ago Flank pain   [place] [facility] Donita Brooks, MD   12 months ago Nausea and vomiting, unspecified vomiting type   [place] [facility] Tanya Nones, Priscille Heidelberg, MD   1 year ago Viral pharyngitis   [place] [facility] Park Meo, FNP       Future Appointments             In 4 days Tanya Nones, Priscille Heidelberg, MD [facility]            Refused Prescriptions Disp Refills   metoprolol succinate (TOPROL-XL) 50 MG 24 hr tablet [Pharmacy Med Name: METOPROLOL SUC ER 50MG  TAB 50 Tablet] 90 tablet 11    Sig: TAKE 1 TABLET BY MOUTH ONCE DAILY TAKE WITH OR IMMEDIATELY FOLLOWING A MEAL     Cardiovascular:  Beta Blockers Passed - 08/28/2023  5:23 PM      Passed - Last BP in normal range    BP Readings from Last 1 Encounters:  05/22/23 130/76         Passed - Last  Heart Rate in normal range    Pulse Readings from Last 1 Encounters:  05/22/23 99         Passed - Valid encounter within last 6 months    Recent Outpatient Visits           3 months ago Viral URI   [place] [facility] Austine Lefort, MD   4 months ago Type 2 diabetes mellitus with diabetic autonomic neuropathy, without long-term current use of insulin [facility])   [place] [facility] Pickard, Cisco Crest, MD   6 months ago Flank pain   [place] [facility] Austine Lefort, MD   12 months ago Nausea and vomiting, unspecified vomiting type   [place] [facility] Austine Lefort, MD   1 year ago Viral pharyngitis   [place] [facility] Jenelle Mis, FNP       Future Appointments             In 4 days Pickard, Cisco Crest, MD [facility], [facility]

## 2023-09-01 ENCOUNTER — Ambulatory Visit (INDEPENDENT_AMBULATORY_CARE_PROVIDER_SITE_OTHER): Admitting: Family Medicine

## 2023-09-01 VITALS — BP 132/76 | HR 102 | Temp 97.8°F | Ht 62.0 in | Wt 192.6 lb

## 2023-09-01 DIAGNOSIS — E1143 Type 2 diabetes mellitus with diabetic autonomic (poly)neuropathy: Secondary | ICD-10-CM

## 2023-09-01 DIAGNOSIS — E1169 Type 2 diabetes mellitus with other specified complication: Secondary | ICD-10-CM | POA: Diagnosis not present

## 2023-09-01 DIAGNOSIS — Z7984 Long term (current) use of oral hypoglycemic drugs: Secondary | ICD-10-CM | POA: Diagnosis not present

## 2023-09-01 DIAGNOSIS — E785 Hyperlipidemia, unspecified: Secondary | ICD-10-CM | POA: Diagnosis not present

## 2023-09-01 DIAGNOSIS — D649 Anemia, unspecified: Secondary | ICD-10-CM | POA: Diagnosis not present

## 2023-09-01 DIAGNOSIS — K760 Fatty (change of) liver, not elsewhere classified: Secondary | ICD-10-CM

## 2023-09-01 MED ORDER — TIRZEPATIDE 2.5 MG/0.5ML ~~LOC~~ SOAJ: 2.5000 mg | SUBCUTANEOUS | 3 refills | Status: DC

## 2023-09-01 NOTE — Progress Notes (Signed)
 Subjective:    Patient ID: Jacqueline [name], female    DOB: October 30, 1965, 58 y.o.   MRN: [id number]  HPI Wt Readings from Last 3 Encounters:  09/01/23 192 lb 9.6 oz (87.4 kg)  05/22/23 190 lb 12.8 oz (86.5 kg)  04/28/23 187 lb 6 oz (85 kg)   At the patient's last visit, she discontinued Ozempic  due to constipation.  We also resumed Crestor .  Her cholesterol has dropped almost 50 points.  Her LDL cholesterol has dropped almost 20 points.  I was very happy with this.  However her weight is gone up 2 points, her A1c has gone up to 7.8, and her liver function test have risen slightly.  She denies any chest pain shortness of breath or dyspnea on exertion.  She did tolerate Trulicity  in the past.  Blood pressure today is excellent.  Lab on 08/27/2023  Component Date Value Ref Range Status   WBC 08/27/2023 6.1  3.8 - 10.8 Thousand/uL Final   RBC 08/27/2023 4.13  3.80 - 5.10 Million/uL Final   Hemoglobin 08/27/2023 10.8 (L)  11.7 - 15.5 g/dL Final   HCT 42/59/5638 33.4 (L)  35.0 - 45.0 % Final   MCV 08/27/2023 80.9  80.0 - 100.0 fL Final   MCH 08/27/2023 26.2 (L)  27.0 - 33.0 pg Final   MCHC 08/27/2023 32.3  32.0 - 36.0 g/dL Final   Comment: For adults, a slight decrease in the calculated MCHC value (in the range of 30 to 32 g/dL) is most likely not clinically significant; however, it should be interpreted with caution in correlation with other red cell parameters and the patient's clinical condition.    RDW 08/27/2023 13.6  11.0 - 15.0 % Final   Platelets 08/27/2023 342  140 - 400 Thousand/uL Final   MPV 08/27/2023 10.3  7.5 - 12.5 fL Final   Neutro Abs 08/27/2023 3,123  1,500 - 7,800 cells/uL Final   Absolute Lymphocytes 08/27/2023 2,288  850 - 3,900 cells/uL Final   Absolute Monocytes 08/27/2023 512  200 - 950 cells/uL Final   Eosinophils Absolute 08/27/2023 159  15 - 500 cells/uL Final   Basophils Absolute 08/27/2023 18  0 - 200 cells/uL Final   Neutrophils Relative % 08/27/2023 51.2  %  Final   Total Lymphocyte 08/27/2023 37.5  % Final   Monocytes Relative 08/27/2023 8.4  % Final   Eosinophils Relative 08/27/2023 2.6  % Final   Basophils Relative 08/27/2023 0.3  % Final   Glucose, Bld 08/27/2023 135 (H)  65 - 99 mg/dL Final   Comment: .            Fasting reference interval . For someone without known diabetes, a glucose value >125 mg/dL indicates that they may have diabetes and this should be confirmed with a follow-up test. .    BUN 08/27/2023 18  7 - 25 mg/dL Final   Creat 75/64/3329 0.87  0.50 - 1.03 mg/dL Final   BUN/Creatinine Ratio 08/27/2023 SEE NOTE:  6 - 22 (calc) Final   Comment:    Not Reported: BUN and Creatinine are within    reference range. .    Sodium 08/27/2023 137  135 - 146 mmol/L Final   Potassium 08/27/2023 4.9  3.5 - 5.3 mmol/L Final   Chloride 08/27/2023 96 (L)  98 - 110 mmol/L Final   CO2 08/27/2023 29  20 - 32 mmol/L Final   Calcium  08/27/2023 9.6  8.6 - 10.4 mg/dL Final   Total Protein 51/88/4166  7.0  6.1 - 8.1 g/dL Final   Albumin 91/47/8295 4.3  3.6 - 5.1 g/dL Final   Globulin 62/13/0865 2.7  1.9 - 3.7 g/dL (calc) Final   AG Ratio 08/27/2023 1.6  1.0 - 2.5 (calc) Final   Total Bilirubin 08/27/2023 0.3  0.2 - 1.2 mg/dL Final   Alkaline phosphatase (APISO) 08/27/2023 88  37 - 153 U/L Final   AST 08/27/2023 36 (H)  10 - 35 U/L Final   ALT 08/27/2023 30 (H)  6 - 29 U/L Final   Hgb A1c MFr Bld 08/27/2023 7.8 (H)  <5.7 % Final   Comment: For someone without known diabetes, a hemoglobin A1c value of 6.5% or greater indicates that they may have  diabetes and this should be confirmed with a follow-up  test. . For someone with known diabetes, a value <7% indicates  that their diabetes is well controlled and a value  greater than or equal to 7% indicates suboptimal  control. A1c targets should be individualized based on  duration of diabetes, age, comorbid conditions, and  other considerations. . Currently, no consensus exists  regarding use of hemoglobin A1c for diagnosis of diabetes for children. .    Mean Plasma Glucose 08/27/2023 177  mg/dL Final   eAG (mmol/L) 78/46/9629 9.8  mmol/L Final   Cholesterol 08/27/2023 183  <200 mg/dL Final   HDL 52/84/1324 48 (L)  > OR = 50 mg/dL Final   Triglycerides 40/02/2724 265 (H)  <150 mg/dL Final   Comment: . If a non-fasting specimen was collected, consider repeat triglyceride testing on a fasting specimen if clinically indicated.  Imagene Mam et al. J. of Clin. Lipidol. 2015;9:129-169. Aaron Aas    LDL Cholesterol (Calc) 08/27/2023 97  mg/dL (calc) Final   Comment: Reference range: <100 . Desirable range <100 mg/dL for primary prevention;   <70 mg/dL for patients with CHD or diabetic patients  with > or = 2 CHD risk factors. Aaron Aas LDL-C is now calculated using the Martin-Hopkins  calculation, which is a validated novel method providing  better accuracy than the Friedewald equation in the  estimation of LDL-C.  Melinda Sprawls et al. Erroll Heard. 3664;403(47): 2061-2068  (http://education.[facility].com/faq/FAQ164)    Total CHOL/HDL Ratio 08/27/2023 3.8  <4.2 (calc) Final   Non-HDL Cholesterol (Calc) 08/27/2023 135 (H)  <130 mg/dL (calc) Final   Comment: For patients with diabetes plus 1 major ASCVD risk  factor, treating to a non-HDL-C goal of <100 mg/dL  (LDL-C of <59 mg/dL) is considered a therapeutic  option.        Past Medical History:  Diagnosis Date   Abnormal transaminases    Allergy    Phreesia 03/01/2020   Anemia    Anxiety disorder    Arthritis    Asthma    Cancer (HCC)    Phreesia 03/01/2020   Cellulitis    Chronic headaches    Diabetes mellitus without complication (HCC)    Phreesia 03/01/2020   Diverticulitis    DM (diabetes mellitus) (HCC)    Erosive esophagitis    Fatty liver    GERD (gastroesophageal reflux disease)    Hyperlipidemia    Phreesia 03/01/2020   IBS (irritable bowel syndrome)    Kidney stones    Neuromuscular disorder (HCC)     Phreesia 03/01/2020   Obesity    Pneumonia    Pseudotumor cerebri    Sinus tachycardia    Past Surgical History:  Procedure Laterality Date   ABDOMINAL HYSTERECTOMY N/A    Phreesia 03/01/2020  ANKLE SURGERY     COLONOSCOPY  06/19/2009   COLONOSCOPY WITH PROPOFOL  N/A 03/29/2020   Procedure: COLONOSCOPY WITH PROPOFOL ;  Surgeon: Urban Garden, MD;  Location: [facility];  Service: Gastroenterology;  Laterality: N/A;  9:00   EGD  06/09/2009   FOOT SURGERY     NASAL SEPTUM SURGERY     ovarian cysts     x3   TONSILLECTOMY     TUBAL LIGATION     VAGINAL HYSTERECTOMY     Current Outpatient Medications on File Prior to Visit  Medication Sig Dispense Refill   albuterol  (VENTOLIN  HFA) 108 (90 Base) MCG/ACT inhaler Inhale 2 puffs into the lungs every 4 (four) hours as needed. 18 g 0   aspirin EC 81 MG tablet Take 81 mg by mouth daily.     blood glucose meter kit and supplies KIT Dispense based on patient and insurance preference. Use up to four times daily as directed. 1 each 0   Blood Glucose Monitoring Suppl (BLOOD GLUCOSE SYSTEM PAK) KIT Please dispense based on patient and insurance preference. Use as directed to monitor FSBS 2x daily. Dx: E11.9. 1 kit 1   cetirizine (ZYRTEC) 10 MG tablet Take 10 mg by mouth daily.     COVID-19 mRNA vaccine 2023-2024 (COMIRNATY ) syringe Inject into the muscle. 0.3 mL 0   DULoxetine  (CYMBALTA ) 60 MG capsule TAKE TWO CAPSULES BY MOUTH ONCE DAILY 60 capsule 2   EPINEPHrine  0.3 mg/0.3 mL IJ SOAJ injection Inject 0.3 mg into the muscle as needed for anaphylaxis. 1 each 0   Flaxseed, Linseed, (FLAXSEED OIL PO) Take 1 capsule by mouth daily.     furosemide  (LASIX ) 20 MG tablet TAKE ONE TABLET BY MOUTH TWICE DAILY 180 tablet 1   gabapentin  (NEURONTIN ) 600 MG tablet TAKE ONE-HALF TO TWO TABLETS BY MOUTH THREE TIMES DAILY AS NEEDED 540 tablet 3   glucose blood (ACCU-CHEK GUIDE) test strip USE AS DIRECTED TWICE DAILY TO check blood sugar 100 strip 11    HYDROcodone  bit-homatropine (HYCODAN) 5-1.5 MG/5ML syrup Take 5 mLs by mouth every 8 (eight) hours as needed for cough. 120 mL 0   influenza vac split quadrivalent PF (FLUARIX) 0.5 ML injection Inject into the muscle. 0.5 mL 0   Lancets (ONETOUCH DELICA PLUS LANCET33G) MISC 1 each by Does not apply route 2 (two) times daily. 200 each 11   metFORMIN  (GLUCOPHAGE ) 1000 MG tablet TAKE 1 TABLET BY MOUTH TWICE DAILY WITH A MEAL 180 tablet 1   metoprolol  succinate (TOPROL -XL) 50 MG 24 hr tablet Take 1 tablet (50 mg total) by mouth daily. Take with or immediately following a meal. 90 tablet 1   oxcarbazepine  (TRILEPTAL ) 600 MG tablet TAKE 1 TABLET BY MOUTH ONCE DAILY 30 tablet 10   pantoprazole  (PROTONIX ) 40 MG tablet TAKE 1 TABLET BY MOUTH ONCE DAILY 90 tablet 1   Polyethyl Glycol-Propyl Glycol (SYSTANE OP) Place 1 drop into both eyes daily as needed (dry eyes).     promethazine  (PHENERGAN ) 25 MG tablet TAKE 1 TABLET BY MOUTH EVERY 6 HOURS AS NEEDED FOR NAUSEA & VOMITING *REFILL REQUEST* 30 tablet 10   rosuvastatin  (CRESTOR ) 20 MG tablet TAKE 1 TABLET BY MOUTH ONCE DAILY 90 tablet 1   triamcinolone  cream (KENALOG ) 0.1 % APPLY TO AFFECTED AREA(S) TWICE DAILY AS NEEDED FOR ECZEMA 80 g 10   valACYclovir  (VALTREX ) 500 MG tablet Take 1 tablet (500 mg total) by mouth 2 (two) times daily as needed. 6 tablet 3   No  current facility-administered medications on file prior to visit.   Allergies  Allergen Reactions   Bee Venom Anaphylaxis   Aspirin Hives, Itching and Swelling   Metoclopramide Hives   Codeine     Increases liver enzymes    Eql Antibacterial Hand Soap [Benzalkonium Chloride] Hives   Nsaids     Hallucinations    Social History   Socioeconomic History   Marital status: Married    Spouse name: Charles   Number of children: 3   Years of education: Not on file   Highest education level: GED or equivalent  Occupational History   Occupation: Disabled  Tobacco Use   Smoking status: Former    Smokeless tobacco: Never  Advertising account planner   Vaping status: Never Used  Substance and Sexual Activity   Alcohol use: No   Drug use: No   Sexual activity: Yes    Birth control/protection: Surgical  Other Topics Concern   Not on file  Social History Narrative   Lives with husband and children.    Social Drivers of Corporate investment banker Strain: Low Risk  (02/12/2023)   Overall Financial Resource Strain (CARDIA)    Difficulty of Paying Living Expenses: Not very hard  Food Insecurity: No Food Insecurity (02/12/2023)   Hunger Vital Sign    Worried About Running Out of Food in the Last Year: Never true    Ran Out of Food in the Last Year: Never true  Transportation Needs: No Transportation Needs (02/12/2023)   PRAPARE - Administrator, Civil Service (Medical): No    Lack of Transportation (Non-Medical): No  Physical Activity: Unknown (02/12/2023)   Exercise Vital Sign    Days of Exercise per Week: Patient declined    Minutes of Exercise per Session: Not on file  Stress: No Stress Concern Present (02/12/2023)   [employer] of Occupational Health - Occupational Stress Questionnaire    Feeling of Stress : Only a little  Social Connections: Moderately Integrated (02/12/2023)   Social Connection and Isolation Panel [NHANES]    Frequency of Communication with Friends and Family: More than three times a week    Frequency of Social Gatherings with Friends and Family: Once a week    Attends Religious Services: 1 to 4 times per year    Active Member of [employer] or Organizations: No    Attends Engineer, structural: Not on file    Marital Status: Married  Catering manager Violence: Not At Risk (05/22/2022)   Humiliation, Afraid, Rape, and Kick questionnaire    Fear of Current or Ex-Partner: No    Emotionally Abused: No    Physically Abused: No    Sexually Abused: No   Family History  Problem Relation Age of Onset   Diabetes Mother    Diabetes Maternal Aunt     Diabetes Son    Colitis Other    Crohn's disease Other    Breast cancer Maternal Aunt    Ovarian cancer Other    Celiac disease Other    Clotting disorder Other    Cystic fibrosis Other        pat. cousin   Heart disease Other    Irritable bowel syndrome Other    Kidney failure Other 31       mat. nephew   Diabetes Other        mat. nephew   Colon cancer Neg Hx       Review of Systems  All other systems reviewed  and are negative.      Objective:   Physical Exam Vitals reviewed.  Constitutional:      General: She is not in acute distress.    Appearance: She is well-developed. She is not diaphoretic.  HENT:     Head: Normocephalic and atraumatic.     Right Ear: External ear normal.     Left Ear: External ear normal.     Nose: Nose normal.     Mouth/Throat:     Pharynx: No oropharyngeal exudate.  Eyes:     General: No scleral icterus.       Right eye: No discharge.        Left eye: No discharge.     Conjunctiva/sclera: Conjunctivae normal.     Pupils: Pupils are equal, round, and reactive to light.  Neck:     Thyroid : No thyromegaly.     Vascular: No JVD.  Cardiovascular:     Rate and Rhythm: Normal rate and regular rhythm.     Heart sounds: Normal heart sounds. No murmur heard.    No friction rub. No gallop.  Pulmonary:     Effort: Pulmonary effort is normal. No respiratory distress.     Breath sounds: Normal breath sounds. No stridor. No wheezing or rales.  Chest:     Chest wall: No tenderness.  Abdominal:     General: Bowel sounds are normal. There is no distension.     Palpations: Abdomen is soft. There is no mass.     Tenderness: There is no abdominal tenderness. There is no guarding or rebound.  Musculoskeletal:        General: No tenderness. Normal range of motion.     Cervical back: Normal range of motion and neck supple.  Lymphadenopathy:     Cervical: No cervical adenopathy.  Skin:    General: Skin is warm.     Coloration: Skin is not pale.      Findings: No erythema or rash.  Neurological:     Mental Status: She is alert and oriented to person, place, and time.     Cranial Nerves: No cranial nerve deficit.     Motor: No abnormal muscle tone.     Coordination: Coordination normal.     Deep Tendon Reflexes: Reflexes are normal and symmetric.  Psychiatric:        Behavior: Behavior normal.        Thought Content: Thought content normal.        Judgment: Judgment normal.           Assessment & Plan:  Type 2 diabetes mellitus with diabetic autonomic neuropathy, without long-term current use of insulin  (HCC)  Dyslipidemia associated with type 2 diabetes mellitus (HCC)  NAFLD (nonalcoholic fatty liver disease)  Anemia, unspecified type - Plan: Vitamin B12, Iron, Fecal Globin By Immunochemistry Given the rise in her liver function test as well as her A1c I recommended that we start Mounjaro 2.5 mg subcu weekly.  Uptitrate as much as tolerated each month to achieve a hemoglobin A1c less than 7 and facilitate weight loss to help treat her alcoholic fatty liver disease.  Cholesterol is outstanding.  Blood pressure is excellent.  I am concerned about her drop in hemoglobin.  We will check a vitamin B12 level, and iron level, and check her stool for any blood loss

## 2023-09-02 ENCOUNTER — Telehealth: Payer: Self-pay

## 2023-09-02 LAB — TEST AUTHORIZATION

## 2023-09-02 LAB — COMPLETE METABOLIC PANEL WITHOUT GFR
AG Ratio: 1.6 (calc) (ref 1.0–2.5)
ALT: 30 U/L — ABNORMAL HIGH (ref 6–29)
AST: 36 U/L — ABNORMAL HIGH (ref 10–35)
Albumin: 4.3 g/dL (ref 3.6–5.1)
Alkaline phosphatase (APISO): 88 U/L (ref 37–153)
BUN: 18 mg/dL (ref 7–25)
CO2: 29 mmol/L (ref 20–32)
Calcium: 9.6 mg/dL (ref 8.6–10.4)
Chloride: 96 mmol/L — ABNORMAL LOW (ref 98–110)
Creat: 0.87 mg/dL (ref 0.50–1.03)
Globulin: 2.7 g/dL (ref 1.9–3.7)
Glucose, Bld: 135 mg/dL — ABNORMAL HIGH (ref 65–99)
Potassium: 4.9 mmol/L (ref 3.5–5.3)
Sodium: 137 mmol/L (ref 135–146)
Total Bilirubin: 0.3 mg/dL (ref 0.2–1.2)
Total Protein: 7 g/dL (ref 6.1–8.1)

## 2023-09-02 LAB — HEMOGLOBIN A1C
Hgb A1c MFr Bld: 7.8 % — ABNORMAL HIGH (ref <5.7)
Mean Plasma Glucose: 177 mg/dL
eAG (mmol/L): 9.8 mmol/L

## 2023-09-02 LAB — CBC WITH DIFFERENTIAL/PLATELET
Absolute Lymphocytes: 2288 {cells}/uL (ref 850–3900)
Absolute Monocytes: 512 {cells}/uL (ref 200–950)
Basophils Absolute: 18 {cells}/uL (ref 0–200)
Basophils Relative: 0.3 %
Eosinophils Absolute: 159 {cells}/uL (ref 15–500)
Eosinophils Relative: 2.6 %
HCT: 33.4 % — ABNORMAL LOW (ref 35.0–45.0)
Hemoglobin: 10.8 g/dL — ABNORMAL LOW (ref 11.7–15.5)
MCH: 26.2 pg — ABNORMAL LOW (ref 27.0–33.0)
MCHC: 32.3 g/dL (ref 32.0–36.0)
MCV: 80.9 fL (ref 80.0–100.0)
MPV: 10.3 fL (ref 7.5–12.5)
Monocytes Relative: 8.4 %
Neutro Abs: 3123 {cells}/uL (ref 1500–7800)
Neutrophils Relative %: 51.2 %
Platelets: 342 10*3/uL (ref 140–400)
RBC: 4.13 10*6/uL (ref 3.80–5.10)
RDW: 13.6 % (ref 11.0–15.0)
Total Lymphocyte: 37.5 %
WBC: 6.1 10*3/uL (ref 3.8–10.8)

## 2023-09-02 LAB — VITAMIN B12: Vitamin B-12: 347 pg/mL (ref 200–1100)

## 2023-09-02 LAB — LIPID PANEL
Cholesterol: 183 mg/dL (ref <200)
HDL: 48 mg/dL — ABNORMAL LOW (ref >=50)
LDL Cholesterol (Calc): 97 mg/dL
Non-HDL Cholesterol (Calc): 135 mg/dL — ABNORMAL HIGH (ref <130)
Total CHOL/HDL Ratio: 3.8 (calc) (ref <5.0)
Triglycerides: 265 mg/dL — ABNORMAL HIGH (ref <150)

## 2023-09-02 LAB — IRON: Iron: 46 ug/dL (ref 45–160)

## 2023-09-02 NOTE — Telephone Encounter (Signed)
 PA received from pharmacy. Mjp,lpn  Copied from CRM [id number]. Topic: General - Other >> Sep 02, 2023  2:04 PM Jacqueline [name] wrote: Reason for CRM: Patient'[name] insurance, Manufacturing engineer requesting a Prior Authorization for medication Monjuaro. Information to be faxed to office.  Callback # [phone] option # 2

## 2023-09-03 ENCOUNTER — Telehealth: Payer: Self-pay

## 2023-09-03 NOTE — Telephone Encounter (Signed)
 Went to [facility] to complete PA but already in progress by another nurse. Mjp,lpn

## 2023-09-04 ENCOUNTER — Telehealth: Payer: Self-pay

## 2023-09-04 NOTE — Telephone Encounter (Signed)
 Health team [employer] denied Mounjaro. Appeal sent at fax # [phone]. Pt advised. Mjp,lpn

## 2023-09-04 NOTE — Telephone Encounter (Signed)
 Copied from CRM [id number]. Topic: General - Other >> Sep 02, 2023  2:04 PM Alysia Jumbo S wrote: Reason for CRM: Patient's insurance, Manufacturing engineer requesting a Prior Authorization for medication Monjuaro. Information to be faxed to office.  Callback # [phone] option # 2 >> Sep 04, 2023 12:08 PM Fredrica W wrote: Patient called to check status of prior authorization. States she went to pharmacy and it was 1200 dollars and she contacted insurance and they said the PA was denied but no one from the office let her know that. Let her know that office is aware and working on [place].  She would like to be contacted once approved so she will no when to go back to pharmacy. Thank you

## 2023-09-05 LAB — FECAL GLOBIN BY IMMUNOCHEMISTRY

## 2023-09-11 ENCOUNTER — Other Ambulatory Visit: Payer: Self-pay | Admitting: Family Medicine

## 2023-09-11 DIAGNOSIS — D649 Anemia, unspecified: Secondary | ICD-10-CM | POA: Diagnosis not present

## 2023-09-12 ENCOUNTER — Other Ambulatory Visit (HOSPITAL_COMMUNITY): Payer: Self-pay

## 2023-09-12 ENCOUNTER — Other Ambulatory Visit (HOSPITAL_BASED_OUTPATIENT_CLINIC_OR_DEPARTMENT_OTHER): Payer: Self-pay

## 2023-09-15 ENCOUNTER — Other Ambulatory Visit (HOSPITAL_COMMUNITY): Payer: Self-pay

## 2023-09-16 ENCOUNTER — Other Ambulatory Visit (HOSPITAL_COMMUNITY): Payer: Self-pay

## 2023-09-16 ENCOUNTER — Other Ambulatory Visit: Payer: Self-pay

## 2023-09-16 MED ORDER — DULOXETINE HCL 60 MG PO CPEP
120.0000 mg | ORAL_CAPSULE | Freq: Every day | ORAL | 2 refills | Status: DC
  Filled 2023-10-07: qty 60, 30d supply, fill #0

## 2023-09-16 MED ORDER — ONETOUCH DELICA PLUS LANCET33G MISC
11 refills | Status: DC
  Filled 2023-09-16: qty 200, 100d supply, fill #0
  Filled 2023-10-07 – 2023-12-29 (×4): qty 200, 100d supply, fill #1

## 2023-09-16 MED ORDER — GLUCOSE BLOOD VI STRP
ORAL_STRIP | 11 refills | Status: DC
  Filled 2023-09-16: qty 100, 50d supply, fill #0

## 2023-09-16 MED FILL — Promethazine HCl Tab 25 MG: ORAL | 8 days supply | Qty: 30 | Fill #0 | Status: AC

## 2023-09-16 MED FILL — Pantoprazole Sodium EC Tab 40 MG (Base Equiv): ORAL | 90 days supply | Qty: 90 | Fill #0 | Status: CN

## 2023-09-16 MED FILL — Oxcarbazepine Tab 600 MG: ORAL | 30 days supply | Qty: 30 | Fill #0 | Status: CN

## 2023-09-17 ENCOUNTER — Other Ambulatory Visit: Payer: Self-pay

## 2023-09-17 ENCOUNTER — Other Ambulatory Visit (HOSPITAL_COMMUNITY): Payer: Self-pay

## 2023-09-17 MED FILL — Rosuvastatin Calcium Tab 20 MG: ORAL | 90 days supply | Qty: 90 | Fill #0 | Status: CN

## 2023-09-18 ENCOUNTER — Other Ambulatory Visit (HOSPITAL_COMMUNITY): Payer: Self-pay

## 2023-09-18 ENCOUNTER — Other Ambulatory Visit: Payer: Self-pay | Admitting: Family Medicine

## 2023-09-19 ENCOUNTER — Other Ambulatory Visit: Payer: Self-pay

## 2023-09-19 ENCOUNTER — Other Ambulatory Visit (HOSPITAL_COMMUNITY): Payer: Self-pay

## 2023-09-19 LAB — FECAL GLOBIN BY IMMUNOCHEMISTRY
FECAL GLOBIN RESULT:: NOT DETECTED
MICRO NUMBER:: 16412908
SPECIMEN QUALITY:: ADEQUATE

## 2023-09-19 LAB — HOUSE ACCOUNT TRACKING

## 2023-09-19 MED FILL — Rosuvastatin Calcium Tab 20 MG: ORAL | 90 days supply | Qty: 90 | Fill #0 | Status: AC

## 2023-09-22 ENCOUNTER — Other Ambulatory Visit (HOSPITAL_COMMUNITY): Payer: Self-pay

## 2023-09-22 MED ORDER — ONETOUCH VERIO VI STRP
ORAL_STRIP | 11 refills | Status: DC
  Filled 2023-09-22 – 2023-10-31 (×3): qty 100, 50d supply, fill #0
  Filled 2023-12-29 (×2): qty 100, 50d supply, fill #1
  Filled 2024-01-28 – 2024-02-19 (×3): qty 100, 50d supply, fill #2
  Filled 2024-03-31: qty 100, 50d supply, fill #3

## 2023-09-22 NOTE — Telephone Encounter (Signed)
 Requested medication (s) are due for refill today - no  Requested medication (s) are on the active medication list -yes  Future visit scheduled -yes  Last refill: kit 01/27/23  Notes to clinic:   Pharmacy comment: Ins covers OneTouch - Pt is needing a new METER to go with the other testing supplies    Requested Prescriptions  Pending Prescriptions Disp Refills   glucose blood (ONETOUCH VERIO) test strip 100 each 11    Sig: Use as directed twice daily to check blood sugar.     Endocrinology: Diabetes - Testing Supplies Passed - 09/22/2023 10:42 AM      Passed - Valid encounter within last 12 months    Recent Outpatient Visits           3 weeks ago Type 2 diabetes mellitus with diabetic autonomic neuropathy, without long-term current use of insulin  [facility])   [place] [facility] Pickard, Cisco Crest, MD   4 months ago Viral URI   [place] [facility] Austine Lefort, MD   4 months ago Type 2 diabetes mellitus with diabetic autonomic neuropathy, without long-term current use of insulin  [facility])   [place] [facility] Pickard, Cisco Crest, MD   7 months ago Flank pain   [place] [facility] Austine Lefort, MD   1 year ago Nausea and vomiting, unspecified vomiting type   [place] [facility] Austine Lefort, MD                 Requested Prescriptions  Pending Prescriptions Disp Refills   glucose blood (ONETOUCH VERIO) test strip 100 each 11    Sig: Use as directed twice daily to check blood sugar.     Endocrinology: Diabetes - Testing Supplies Passed - 09/22/2023 10:42 AM      Passed - Valid encounter within last 12 months    Recent Outpatient Visits           3 weeks ago Type 2 diabetes mellitus with diabetic autonomic neuropathy, without long-term current use of insulin  [facility])   [facility] Pickard, Cisco Crest, MD   4 months ago Viral URI   [facility] Austine Lefort, MD   4 months ago Type 2 diabetes mellitus with diabetic autonomic neuropathy, without long-term current use of insulin  [facility])   [place] [facility] Austine Lefort, MD   7 months ago Flank pain   [place] [facility] Medicine Austine Lefort, MD   1 year ago Nausea and vomiting, unspecified vomiting type    [facility] Pickard, Cisco Crest, MD

## 2023-10-01 ENCOUNTER — Other Ambulatory Visit

## 2023-10-01 DIAGNOSIS — D649 Anemia, unspecified: Secondary | ICD-10-CM

## 2023-10-01 LAB — CBC WITH DIFFERENTIAL/PLATELET
Absolute Lymphocytes: 2215 {cells}/uL (ref 850–3900)
Absolute Monocytes: 555 {cells}/uL (ref 200–950)
Basophils Absolute: 20 {cells}/uL (ref 0–200)
Basophils Relative: 0.4 %
Eosinophils Absolute: 100 {cells}/uL (ref 15–500)
Eosinophils Relative: 2 %
HCT: 36.8 % (ref 35.0–45.0)
Hemoglobin: 11.9 g/dL (ref 11.7–15.5)
MCH: 26.7 pg — ABNORMAL LOW (ref 27.0–33.0)
MCHC: 32.3 g/dL (ref 32.0–36.0)
MCV: 82.5 fL (ref 80.0–100.0)
MPV: 10.4 fL (ref 7.5–12.5)
Monocytes Relative: 11.1 %
Neutro Abs: 2110 {cells}/uL (ref 1500–7800)
Neutrophils Relative %: 42.2 %
Platelets: 323 10*3/uL (ref 140–400)
RBC: 4.46 10*6/uL (ref 3.80–5.10)
RDW: 17 % — ABNORMAL HIGH (ref 11.0–15.0)
Total Lymphocyte: 44.3 %
WBC: 5 10*3/uL (ref 3.8–10.8)

## 2023-10-02 ENCOUNTER — Other Ambulatory Visit: Payer: Self-pay

## 2023-10-02 ENCOUNTER — Other Ambulatory Visit: Payer: Self-pay | Admitting: Family Medicine

## 2023-10-02 ENCOUNTER — Ambulatory Visit: Payer: Self-pay | Admitting: Family Medicine

## 2023-10-02 ENCOUNTER — Other Ambulatory Visit (HOSPITAL_COMMUNITY): Payer: Self-pay

## 2023-10-02 MED ORDER — TIRZEPATIDE 5 MG/0.5ML ~~LOC~~ SOAJ
5.0000 mg | SUBCUTANEOUS | 3 refills | Status: DC
  Filled 2023-10-02: qty 2, 28d supply, fill #0
  Filled 2023-10-07 – 2023-10-30 (×2): qty 2, 28d supply, fill #1

## 2023-10-07 MED FILL — Rosuvastatin Calcium Tab 20 MG: ORAL | 90 days supply | Qty: 90 | Fill #1 | Status: CN

## 2023-10-07 MED FILL — Promethazine HCl Tab 25 MG: ORAL | 8 days supply | Qty: 30 | Fill #1 | Status: AC

## 2023-10-07 MED FILL — Oxcarbazepine Tab 600 MG: ORAL | 30 days supply | Qty: 30 | Fill #0 | Status: AC

## 2023-10-07 MED FILL — Pantoprazole Sodium EC Tab 40 MG (Base Equiv): ORAL | 90 days supply | Qty: 90 | Fill #0 | Status: AC

## 2023-10-07 MED FILL — Metformin HCl Tab 1000 MG: ORAL | 90 days supply | Qty: 180 | Fill #0 | Status: AC

## 2023-10-08 ENCOUNTER — Other Ambulatory Visit: Payer: Self-pay

## 2023-10-08 ENCOUNTER — Other Ambulatory Visit (HOSPITAL_COMMUNITY): Payer: Self-pay

## 2023-10-13 ENCOUNTER — Other Ambulatory Visit: Payer: Self-pay

## 2023-10-13 DIAGNOSIS — E0842 Diabetes mellitus due to underlying condition with diabetic polyneuropathy: Secondary | ICD-10-CM

## 2023-10-13 DIAGNOSIS — E1143 Type 2 diabetes mellitus with diabetic autonomic (poly)neuropathy: Secondary | ICD-10-CM

## 2023-10-13 DIAGNOSIS — I10 Essential (primary) hypertension: Secondary | ICD-10-CM

## 2023-10-13 DIAGNOSIS — E1169 Type 2 diabetes mellitus with other specified complication: Secondary | ICD-10-CM

## 2023-10-13 DIAGNOSIS — T7840XA Allergy, unspecified, initial encounter: Secondary | ICD-10-CM

## 2023-10-13 MED ORDER — EPINEPHRINE 0.3 MG/0.3ML IJ SOAJ
0.3000 mg | INTRAMUSCULAR | 0 refills | Status: AC | PRN
  Filled 2023-10-13: qty 2, 30d supply, fill #0

## 2023-10-13 MED ORDER — BLOOD GLUCOSE SYSTEM PAK KIT
PACK | 1 refills | Status: DC
  Filled 2023-10-13: qty 1, 1d supply, fill #0
  Filled 2023-10-30 – 2023-12-29 (×4): qty 1, 1d supply, fill #1

## 2023-10-13 MED ORDER — METOPROLOL SUCCINATE ER 50 MG PO TB24
50.0000 mg | ORAL_TABLET | Freq: Every day | ORAL | 1 refills | Status: DC
  Filled 2023-10-13: qty 90, 90d supply, fill #0
  Filled 2023-10-31 – 2023-12-29 (×3): qty 90, 90d supply, fill #1

## 2023-10-13 MED ORDER — BLOOD GLUCOSE MONITOR KIT: PACK | 0 refills | Status: AC

## 2023-10-14 ENCOUNTER — Other Ambulatory Visit: Payer: Self-pay

## 2023-10-14 ENCOUNTER — Other Ambulatory Visit (HOSPITAL_COMMUNITY): Payer: Self-pay

## 2023-10-14 ENCOUNTER — Encounter: Payer: Self-pay | Admitting: Pharmacist

## 2023-10-16 ENCOUNTER — Other Ambulatory Visit (HOSPITAL_COMMUNITY): Payer: Self-pay

## 2023-10-16 ENCOUNTER — Other Ambulatory Visit: Payer: Self-pay

## 2023-10-27 DIAGNOSIS — R0789 Other chest pain: Secondary | ICD-10-CM | POA: Diagnosis not present

## 2023-10-27 DIAGNOSIS — I10 Essential (primary) hypertension: Secondary | ICD-10-CM | POA: Diagnosis not present

## 2023-10-27 DIAGNOSIS — E1169 Type 2 diabetes mellitus with other specified complication: Secondary | ICD-10-CM | POA: Diagnosis not present

## 2023-10-27 DIAGNOSIS — R002 Palpitations: Secondary | ICD-10-CM | POA: Diagnosis not present

## 2023-10-30 ENCOUNTER — Other Ambulatory Visit: Payer: Self-pay

## 2023-10-30 ENCOUNTER — Other Ambulatory Visit (HOSPITAL_COMMUNITY): Payer: Self-pay

## 2023-10-31 ENCOUNTER — Other Ambulatory Visit (HOSPITAL_COMMUNITY): Payer: Self-pay

## 2023-10-31 ENCOUNTER — Other Ambulatory Visit: Payer: Self-pay

## 2023-10-31 ENCOUNTER — Other Ambulatory Visit: Payer: Self-pay | Admitting: Family Medicine

## 2023-10-31 DIAGNOSIS — I10 Essential (primary) hypertension: Secondary | ICD-10-CM

## 2023-10-31 MED FILL — Pantoprazole Sodium EC Tab 40 MG (Base Equiv): ORAL | 30 days supply | Qty: 30 | Fill #1 | Status: CN

## 2023-10-31 MED FILL — Promethazine HCl Tab 25 MG: ORAL | 8 days supply | Qty: 30 | Fill #2 | Status: AC

## 2023-10-31 MED FILL — Rosuvastatin Calcium Tab 20 MG: ORAL | 90 days supply | Qty: 90 | Fill #1 | Status: CN

## 2023-10-31 MED FILL — Oxcarbazepine Tab 600 MG: ORAL | 30 days supply | Qty: 30 | Fill #1 | Status: AC

## 2023-11-03 ENCOUNTER — Other Ambulatory Visit: Payer: Self-pay

## 2023-11-03 ENCOUNTER — Other Ambulatory Visit (HOSPITAL_COMMUNITY): Payer: Self-pay

## 2023-11-03 MED ORDER — FUROSEMIDE 20 MG PO TABS
20.0000 mg | ORAL_TABLET | Freq: Two times a day (BID) | ORAL | 1 refills | Status: DC
  Filled 2023-11-03: qty 180, 90d supply, fill #0
  Filled 2023-12-29 – 2024-01-28 (×3): qty 180, 90d supply, fill #1

## 2023-11-03 MED ORDER — DULOXETINE HCL 60 MG PO CPEP
120.0000 mg | ORAL_CAPSULE | Freq: Every day | ORAL | 2 refills | Status: DC
  Filled 2023-11-03: qty 60, 30d supply, fill #0
  Filled 2023-12-29 (×2): qty 60, 30d supply, fill #1
  Filled 2024-01-28: qty 60, 30d supply, fill #2

## 2023-11-03 MED ORDER — METFORMIN HCL 1000 MG PO TABS
1000.0000 mg | ORAL_TABLET | Freq: Two times a day (BID) | ORAL | 1 refills | Status: DC
  Filled 2023-11-03 – 2023-12-29 (×3): qty 180, 90d supply, fill #0
  Filled 2024-01-28 – 2024-03-31 (×4): qty 180, 90d supply, fill #1

## 2023-11-22 ENCOUNTER — Encounter: Payer: Self-pay | Admitting: Family Medicine

## 2023-11-25 ENCOUNTER — Other Ambulatory Visit: Payer: Self-pay

## 2023-11-25 ENCOUNTER — Other Ambulatory Visit (HOSPITAL_COMMUNITY): Payer: Self-pay

## 2023-11-25 ENCOUNTER — Other Ambulatory Visit: Payer: Self-pay | Admitting: Family Medicine

## 2023-11-25 MED ORDER — TIRZEPATIDE 7.5 MG/0.5ML ~~LOC~~ SOAJ
7.5000 mg | SUBCUTANEOUS | 3 refills | Status: DC
Start: 2023-11-25 — End: 2024-01-19
  Filled 2023-11-25: qty 6, 84d supply, fill #0
  Filled 2023-12-01: qty 2, 28d supply, fill #0
  Filled 2023-12-29 (×2): qty 2, 28d supply, fill #1

## 2023-12-01 ENCOUNTER — Other Ambulatory Visit: Payer: Self-pay

## 2023-12-01 ENCOUNTER — Other Ambulatory Visit (HOSPITAL_COMMUNITY): Payer: Self-pay

## 2023-12-26 ENCOUNTER — Other Ambulatory Visit (HOSPITAL_COMMUNITY): Payer: Self-pay

## 2023-12-29 ENCOUNTER — Other Ambulatory Visit: Payer: Self-pay

## 2023-12-29 ENCOUNTER — Other Ambulatory Visit (HOSPITAL_COMMUNITY): Payer: Self-pay

## 2023-12-29 MED FILL — Promethazine HCl Tab 25 MG: ORAL | 8 days supply | Qty: 30 | Fill #3 | Status: CN

## 2023-12-29 MED FILL — Rosuvastatin Calcium Tab 20 MG: ORAL | 90 days supply | Qty: 90 | Fill #1 | Status: AC

## 2023-12-29 MED FILL — Pantoprazole Sodium EC Tab 40 MG (Base Equiv): ORAL | 30 days supply | Qty: 30 | Fill #1 | Status: CN

## 2023-12-29 MED FILL — Pantoprazole Sodium EC Tab 40 MG (Base Equiv): ORAL | 30 days supply | Qty: 30 | Fill #1 | Status: AC

## 2023-12-29 MED FILL — Oxcarbazepine Tab 600 MG: ORAL | 30 days supply | Qty: 30 | Fill #2 | Status: CN

## 2023-12-29 MED FILL — Rosuvastatin Calcium Tab 20 MG: ORAL | 90 days supply | Qty: 90 | Fill #1 | Status: CN

## 2023-12-29 MED FILL — Promethazine HCl Tab 25 MG: ORAL | 8 days supply | Qty: 30 | Fill #3 | Status: AC

## 2023-12-29 MED FILL — Oxcarbazepine Tab 600 MG: ORAL | 30 days supply | Qty: 30 | Fill #2 | Status: AC

## 2023-12-30 ENCOUNTER — Other Ambulatory Visit: Payer: Self-pay

## 2023-12-31 ENCOUNTER — Other Ambulatory Visit (HOSPITAL_COMMUNITY): Payer: Self-pay

## 2024-01-19 ENCOUNTER — Other Ambulatory Visit: Payer: Self-pay

## 2024-01-19 ENCOUNTER — Other Ambulatory Visit (HOSPITAL_COMMUNITY): Payer: Self-pay

## 2024-01-19 MED ORDER — TIRZEPATIDE 10 MG/0.5ML ~~LOC~~ SOAJ
10.0000 mg | SUBCUTANEOUS | 1 refills | Status: DC
  Filled 2024-01-19: qty 2, 28d supply, fill #0
  Filled 2024-02-19: qty 2, 28d supply, fill #1

## 2024-01-28 ENCOUNTER — Other Ambulatory Visit: Payer: Self-pay | Admitting: Family Medicine

## 2024-01-28 ENCOUNTER — Other Ambulatory Visit: Payer: Self-pay

## 2024-01-28 ENCOUNTER — Other Ambulatory Visit (HOSPITAL_COMMUNITY): Payer: Self-pay

## 2024-01-28 DIAGNOSIS — R002 Palpitations: Secondary | ICD-10-CM | POA: Diagnosis not present

## 2024-01-28 DIAGNOSIS — E782 Mixed hyperlipidemia: Secondary | ICD-10-CM | POA: Diagnosis not present

## 2024-01-28 DIAGNOSIS — E1169 Type 2 diabetes mellitus with other specified complication: Secondary | ICD-10-CM | POA: Diagnosis not present

## 2024-01-28 DIAGNOSIS — I10 Essential (primary) hypertension: Secondary | ICD-10-CM

## 2024-01-28 DIAGNOSIS — E785 Hyperlipidemia, unspecified: Secondary | ICD-10-CM | POA: Diagnosis not present

## 2024-01-28 DIAGNOSIS — K219 Gastro-esophageal reflux disease without esophagitis: Secondary | ICD-10-CM

## 2024-01-28 DIAGNOSIS — E119 Type 2 diabetes mellitus without complications: Secondary | ICD-10-CM | POA: Diagnosis not present

## 2024-01-28 MED ORDER — ROSUVASTATIN CALCIUM 20 MG PO TABS
20.0000 mg | ORAL_TABLET | Freq: Every day | ORAL | 1 refills | Status: DC
  Filled 2024-01-28 – 2024-04-05 (×5): qty 90, 90d supply, fill #0

## 2024-01-28 MED ORDER — PROMETHAZINE HCL 25 MG PO TABS
25.0000 mg | ORAL_TABLET | Freq: Four times a day (QID) | ORAL | 10 refills | Status: DC | PRN
  Filled 2024-01-28: qty 30, 8d supply, fill #0
  Filled 2024-01-29 – 2024-02-25 (×2): qty 30, 8d supply, fill #1

## 2024-01-28 MED ORDER — PANTOPRAZOLE SODIUM 40 MG PO TBEC
40.0000 mg | DELAYED_RELEASE_TABLET | Freq: Every day | ORAL | 1 refills | Status: DC
  Filled 2024-01-28: qty 90, 90d supply, fill #0
  Filled 2024-01-29 – 2024-02-25 (×2): qty 90, 90d supply, fill #1

## 2024-01-28 MED ORDER — METOPROLOL SUCCINATE ER 50 MG PO TB24
50.0000 mg | ORAL_TABLET | Freq: Every day | ORAL | 1 refills | Status: DC
  Filled 2024-01-28 – 2024-03-31 (×4): qty 90, 90d supply, fill #0

## 2024-01-28 MED ORDER — VALACYCLOVIR HCL 500 MG PO TABS
500.0000 mg | ORAL_TABLET | Freq: Two times a day (BID) | ORAL | 3 refills | Status: DC | PRN
  Filled 2024-01-28: qty 6, 3d supply, fill #0
  Filled 2024-01-29 – 2024-02-25 (×2): qty 6, 3d supply, fill #1

## 2024-01-28 MED FILL — Oxcarbazepine Tab 600 MG: ORAL | 30 days supply | Qty: 30 | Fill #3 | Status: AC

## 2024-01-28 MED FILL — Lancets: Qty: 200 | Fill #0 | Status: CN

## 2024-01-29 ENCOUNTER — Other Ambulatory Visit (HOSPITAL_COMMUNITY): Payer: Self-pay

## 2024-01-29 ENCOUNTER — Other Ambulatory Visit: Payer: Self-pay | Admitting: Family Medicine

## 2024-01-29 DIAGNOSIS — I10 Essential (primary) hypertension: Secondary | ICD-10-CM

## 2024-01-29 MED FILL — Lancets: Qty: 200 | Fill #0 | Status: CN

## 2024-01-29 MED FILL — Oxcarbazepine Tab 600 MG: ORAL | 30 days supply | Qty: 30 | Fill #4 | Status: CN

## 2024-01-30 ENCOUNTER — Other Ambulatory Visit (HOSPITAL_COMMUNITY): Payer: Self-pay

## 2024-01-30 MED ORDER — DULOXETINE HCL 60 MG PO CPEP
120.0000 mg | ORAL_CAPSULE | Freq: Every day | ORAL | 2 refills | Status: DC
  Filled 2024-01-30 – 2024-02-25 (×2): qty 60, 30d supply, fill #0
  Filled 2024-03-26 – 2024-04-05 (×2): qty 60, 30d supply, fill #1

## 2024-01-30 NOTE — Telephone Encounter (Signed)
 Requested Prescriptions  Pending Prescriptions Disp Refills   DULoxetine  (CYMBALTA ) 60 MG capsule 60 capsule 2    Sig: Take 2 capsules (120 mg total) by mouth daily.     Psychiatry: Antidepressants - SNRI - duloxetine  Passed - 01/30/2024 10:40 AM      Passed - Cr in normal range and within 360 days    Creat  Date Value Ref Range Status  08/27/2023 0.87 0.50 - 1.03 mg/dL Final   Creatinine, Urine  Date Value Ref Range Status  04/23/2023 11 (L) 20 - 275 mg/dL Final         Passed - eGFR is 30 or above and within 360 days    GFR, Est African American  Date Value Ref Range Status  02/28/2020 114 > OR = 60 mL/min/1.5m2 Final   GFR, Est Non African American  Date Value Ref Range Status  02/28/2020 99 > OR = 60 mL/min/1.12m2 Final   GFR, Estimated  Date Value Ref Range Status  07/12/2020 >60 >60 mL/min Final    Comment:    (NOTE) Calculated using the CKD-EPI Creatinine Equation (2021)    eGFR  Date Value Ref Range Status  04/23/2023 67 > OR = 60 mL/min/1.84m2 Final         Passed - Completed PHQ-2 or PHQ-9 in the last 360 days      Passed - Last BP in normal range    BP Readings from Last 1 Encounters:  09/01/23 132/76         Passed - Valid encounter within last 6 months    Recent Outpatient Visits           5 months ago Type 2 diabetes mellitus with diabetic autonomic neuropathy, without long-term current use of insulin  [facility])   [place] [facility] Pickard, Butler DASEN, MD   8 months ago Viral URI   [place] [facility] Duanne Butler DASEN, MD   9 months ago Type 2 diabetes mellitus with diabetic autonomic neuropathy, without long-term current use of insulin  [facility])   [facility] Pickard, Butler DASEN, MD   11 months ago Flank pain   [place] [facility] Medicine Duanne Butler DASEN, MD   1 year ago Nausea and vomiting, unspecified vomiting type   [place] [facility] Medicine Pickard,  Butler DASEN, MD              Refused Prescriptions Disp Refills   furosemide  (LASIX ) 20 MG tablet 180 tablet     Sig: Take 1 tablet (20 mg total) by mouth 2 (two) times daily.     Cardiovascular:  Diuretics - Loop Failed - 01/30/2024 10:40 AM      Failed - Cl in normal range and within 180 days    Chloride  Date Value Ref Range Status  08/27/2023 96 (L) 98 - 110 mmol/L Final         Failed - Mg Level in normal range and within 180 days    No results found for: MG       Passed - K in normal range and within 180 days    Potassium  Date Value Ref Range Status  08/27/2023 4.9 3.5 - 5.3 mmol/L Final         Passed - Ca in normal range and within 180 days    Calcium   Date Value Ref Range Status  08/27/2023 9.6 8.6 - 10.4 mg/dL Final  Passed - Na in normal range and within 180 days    Sodium  Date Value Ref Range Status  08/27/2023 137 135 - 146 mmol/L Final         Passed - Cr in normal range and within 180 days    Creat  Date Value Ref Range Status  08/27/2023 0.87 0.50 - 1.03 mg/dL Final   Creatinine, Urine  Date Value Ref Range Status  04/23/2023 11 (L) 20 - 275 mg/dL Final         Passed - Last BP in normal range    BP Readings from Last 1 Encounters:  09/01/23 132/76         Passed - Valid encounter within last 6 months    Recent Outpatient Visits           5 months ago Type 2 diabetes mellitus with diabetic autonomic neuropathy, without long-term current use of insulin  [facility])   [place] [facility] Pickard, Butler DASEN, MD   8 months ago Viral URI   [place] [facility] Duanne Butler DASEN, MD   9 months ago Type 2 diabetes mellitus with diabetic autonomic neuropathy, without long-term current use of insulin  [facility])   [place] [facility] Pickard, Butler DASEN, MD   11 months ago Flank pain   [place] [facility] Duanne Butler DASEN, MD   1 year ago Nausea and vomiting,  unspecified vomiting type    [facility] Pickard, Butler DASEN, MD

## 2024-02-19 ENCOUNTER — Other Ambulatory Visit (HOSPITAL_COMMUNITY): Payer: Self-pay

## 2024-02-19 ENCOUNTER — Other Ambulatory Visit: Payer: Self-pay

## 2024-02-19 MED FILL — Lancets: 100 days supply | Qty: 200 | Fill #0 | Status: CN

## 2024-02-23 ENCOUNTER — Other Ambulatory Visit (HOSPITAL_BASED_OUTPATIENT_CLINIC_OR_DEPARTMENT_OTHER): Payer: Self-pay

## 2024-02-23 MED ORDER — FLUZONE 0.5 ML IM SUSY
0.5000 mL | PREFILLED_SYRINGE | Freq: Once | INTRAMUSCULAR | 0 refills | Status: AC
  Filled 2024-02-23: qty 0.5, 1d supply, fill #0

## 2024-02-23 MED ORDER — COMIRNATY 30 MCG/0.3ML IM SUSY
0.3000 mL | PREFILLED_SYRINGE | Freq: Once | INTRAMUSCULAR | 0 refills | Status: AC
  Filled 2024-02-23: qty 0.3, 1d supply, fill #0

## 2024-02-25 ENCOUNTER — Other Ambulatory Visit: Payer: Self-pay

## 2024-02-25 ENCOUNTER — Other Ambulatory Visit: Payer: Self-pay | Admitting: Family Medicine

## 2024-02-25 DIAGNOSIS — I10 Essential (primary) hypertension: Secondary | ICD-10-CM

## 2024-02-25 MED FILL — Lancets: 100 days supply | Qty: 200 | Fill #0 | Status: CN

## 2024-02-25 MED FILL — Oxcarbazepine Tab 600 MG: ORAL | 30 days supply | Qty: 30 | Fill #4 | Status: AC

## 2024-02-26 ENCOUNTER — Other Ambulatory Visit: Payer: Self-pay

## 2024-02-26 ENCOUNTER — Other Ambulatory Visit (HOSPITAL_COMMUNITY): Payer: Self-pay

## 2024-02-26 MED ORDER — FUROSEMIDE 20 MG PO TABS
20.0000 mg | ORAL_TABLET | Freq: Two times a day (BID) | ORAL | 1 refills | Status: DC
  Filled 2024-02-26: qty 180, 90d supply, fill #0

## 2024-03-11 ENCOUNTER — Other Ambulatory Visit: Payer: Self-pay

## 2024-03-11 ENCOUNTER — Encounter: Payer: Self-pay | Admitting: Family Medicine

## 2024-03-11 MED ORDER — TIRZEPATIDE 12.5 MG/0.5ML ~~LOC~~ SOAJ
12.5000 mg | SUBCUTANEOUS | 1 refills | Status: DC
  Filled 2024-03-11: qty 2, 28d supply, fill #0
  Filled 2024-03-31 – 2024-04-01 (×2): qty 2, 28d supply, fill #1

## 2024-03-22 DIAGNOSIS — Z131 Encounter for screening for diabetes mellitus: Secondary | ICD-10-CM | POA: Diagnosis not present

## 2024-03-26 ENCOUNTER — Other Ambulatory Visit (HOSPITAL_COMMUNITY): Payer: Self-pay

## 2024-03-26 MED FILL — Oxcarbazepine Tab 600 MG: ORAL | 30 days supply | Qty: 30 | Fill #5 | Status: CN

## 2024-03-26 MED FILL — Lancets: 100 days supply | Qty: 200 | Fill #0 | Status: CN

## 2024-03-31 ENCOUNTER — Other Ambulatory Visit: Payer: Self-pay

## 2024-03-31 ENCOUNTER — Other Ambulatory Visit (HOSPITAL_COMMUNITY): Payer: Self-pay

## 2024-04-01 ENCOUNTER — Other Ambulatory Visit (HOSPITAL_COMMUNITY): Payer: Self-pay

## 2024-04-02 ENCOUNTER — Other Ambulatory Visit (HOSPITAL_COMMUNITY): Payer: Self-pay

## 2024-04-03 ENCOUNTER — Other Ambulatory Visit (HOSPITAL_COMMUNITY): Payer: Self-pay

## 2024-04-05 ENCOUNTER — Other Ambulatory Visit (HOSPITAL_COMMUNITY): Payer: Self-pay

## 2024-04-05 DIAGNOSIS — Z13228 Encounter for screening for other metabolic disorders: Secondary | ICD-10-CM | POA: Diagnosis not present

## 2024-04-05 MED FILL — Oxcarbazepine Tab 600 MG: ORAL | 30 days supply | Qty: 30 | Fill #5 | Status: AC

## 2024-04-05 MED FILL — Lancets: 100 days supply | Qty: 200 | Fill #0 | Status: AC

## 2024-04-06 ENCOUNTER — Other Ambulatory Visit (HOSPITAL_COMMUNITY): Payer: Self-pay

## 2024-04-10 LAB — KIDNEY PROFILE
Albumin, Urine POC: 5
Creatinine, POC: 24.1 mg/dL
EGFR: 71
Microalb Creat Ratio: 21

## 2024-04-15 ENCOUNTER — Other Ambulatory Visit: Payer: Self-pay

## 2024-04-15 ENCOUNTER — Encounter: Payer: Self-pay | Admitting: Family Medicine

## 2024-04-15 DIAGNOSIS — E1169 Type 2 diabetes mellitus with other specified complication: Secondary | ICD-10-CM

## 2024-04-15 DIAGNOSIS — K219 Gastro-esophageal reflux disease without esophagitis: Secondary | ICD-10-CM

## 2024-04-15 DIAGNOSIS — E0842 Diabetes mellitus due to underlying condition with diabetic polyneuropathy: Secondary | ICD-10-CM

## 2024-04-15 DIAGNOSIS — I10 Essential (primary) hypertension: Secondary | ICD-10-CM

## 2024-04-15 MED ORDER — METFORMIN HCL 1000 MG PO TABS: 1000.0000 mg | ORAL_TABLET | Freq: Two times a day (BID) | ORAL | 1 refills | Status: AC

## 2024-04-15 MED ORDER — DULOXETINE HCL 60 MG PO CPEP: ORAL_CAPSULE | ORAL | 2 refills | Status: AC

## 2024-04-15 MED ORDER — GABAPENTIN 600 MG PO TABS: ORAL_TABLET | ORAL | 3 refills | Status: AC

## 2024-04-15 MED ORDER — TIRZEPATIDE 12.5 MG/0.5ML ~~LOC~~ SOAJ: 12.5000 mg | SUBCUTANEOUS | 1 refills | Status: AC

## 2024-04-15 MED ORDER — ROSUVASTATIN CALCIUM 20 MG PO TABS: 20.0000 mg | ORAL_TABLET | Freq: Every day | ORAL | 1 refills | Status: AC

## 2024-04-15 MED ORDER — METOPROLOL SUCCINATE ER 50 MG PO TB24: 50.0000 mg | ORAL_TABLET | Freq: Every day | ORAL | 1 refills | Status: AC

## 2024-04-15 MED ORDER — FUROSEMIDE 20 MG PO TABS: 20.0000 mg | ORAL_TABLET | Freq: Two times a day (BID) | ORAL | 1 refills | Status: AC

## 2024-04-15 MED ORDER — PANTOPRAZOLE SODIUM 40 MG PO TBEC: 40.0000 mg | DELAYED_RELEASE_TABLET | Freq: Every day | ORAL | 1 refills | Status: AC

## 2024-04-28 ENCOUNTER — Other Ambulatory Visit

## 2024-04-28 DIAGNOSIS — Z0000 Encounter for general adult medical examination without abnormal findings: Secondary | ICD-10-CM

## 2024-04-28 DIAGNOSIS — D649 Anemia, unspecified: Secondary | ICD-10-CM

## 2024-04-28 DIAGNOSIS — E1169 Type 2 diabetes mellitus with other specified complication: Secondary | ICD-10-CM

## 2024-04-28 DIAGNOSIS — K219 Gastro-esophageal reflux disease without esophagitis: Secondary | ICD-10-CM

## 2024-04-28 DIAGNOSIS — E0842 Diabetes mellitus due to underlying condition with diabetic polyneuropathy: Secondary | ICD-10-CM

## 2024-04-28 DIAGNOSIS — I10 Essential (primary) hypertension: Secondary | ICD-10-CM

## 2024-04-29 ENCOUNTER — Ambulatory Visit: Payer: Self-pay | Admitting: Family Medicine

## 2024-04-29 LAB — CBC WITH DIFFERENTIAL/PLATELET
Absolute Lymphocytes: 2778 {cells}/uL (ref 850–3900)
Absolute Monocytes: 453 {cells}/uL (ref 200–950)
Basophils Absolute: 19 {cells}/uL (ref 0–200)
Basophils Relative: 0.3 %
Eosinophils Absolute: 143 {cells}/uL (ref 15–500)
Eosinophils Relative: 2.3 %
HCT: 44.6 % (ref 35.9–46.0)
Hemoglobin: 14.8 g/dL (ref 11.7–15.5)
MCH: 29.4 pg (ref 27.0–33.0)
MCHC: 33.2 g/dL (ref 31.6–35.4)
MCV: 88.5 fL (ref 81.4–101.7)
MPV: 10.4 fL (ref 7.5–12.5)
Monocytes Relative: 7.3 %
Neutro Abs: 2809 {cells}/uL (ref 1500–7800)
Neutrophils Relative %: 45.3 %
Platelets: 378 Thousand/uL (ref 140–400)
RBC: 5.04 Million/uL (ref 3.80–5.10)
RDW: 12.4 % (ref 11.0–15.0)
Total Lymphocyte: 44.8 %
WBC: 6.2 Thousand/uL (ref 3.8–10.8)

## 2024-04-29 LAB — MICROALBUMIN / CREATININE URINE RATIO
Creatinine, Urine: 229 mg/dL (ref 20–275)
Microalb Creat Ratio: 87 mg/g{creat} — ABNORMAL HIGH (ref <30)
Microalb, Ur: 20 mg/dL

## 2024-04-29 LAB — COMPLETE METABOLIC PANEL WITHOUT GFR
AG Ratio: 1.9 (calc) (ref 1.0–2.5)
ALT: 8 U/L (ref 6–29)
AST: 15 U/L (ref 10–35)
Albumin: 4.9 g/dL (ref 3.6–5.1)
Alkaline phosphatase (APISO): 83 U/L (ref 37–153)
BUN/Creatinine Ratio: 12 (calc) (ref 6–22)
BUN: 16 mg/dL (ref 7–25)
CO2: 34 mmol/L — ABNORMAL HIGH (ref 20–32)
Calcium: 9.2 mg/dL (ref 8.6–10.4)
Chloride: 89 mmol/L — ABNORMAL LOW (ref 98–110)
Creat: 1.39 mg/dL — ABNORMAL HIGH (ref 0.50–1.03)
Globulin: 2.6 g/dL (ref 1.9–3.7)
Glucose, Bld: 112 mg/dL — ABNORMAL HIGH (ref 65–99)
Potassium: 3.3 mmol/L — ABNORMAL LOW (ref 3.5–5.3)
Sodium: 138 mmol/L (ref 135–146)
Total Bilirubin: 0.4 mg/dL (ref 0.2–1.2)
Total Protein: 7.5 g/dL (ref 6.1–8.1)

## 2024-04-29 LAB — VITAMIN B12: Vitamin B-12: 366 pg/mL (ref 200–1100)

## 2024-04-29 LAB — LIPID PANEL
Cholesterol: 144 mg/dL (ref <200)
HDL: 41 mg/dL — ABNORMAL LOW (ref >=50)
LDL Cholesterol (Calc): 70 mg/dL
Non-HDL Cholesterol (Calc): 103 mg/dL (ref <130)
Total CHOL/HDL Ratio: 3.5 (calc) (ref <5.0)
Triglycerides: 252 mg/dL — ABNORMAL HIGH (ref <150)

## 2024-05-03 ENCOUNTER — Other Ambulatory Visit: Payer: Self-pay

## 2024-05-03 ENCOUNTER — Encounter: Admitting: Family Medicine

## 2024-05-03 ENCOUNTER — Other Ambulatory Visit: Payer: Self-pay | Admitting: Family Medicine

## 2024-05-03 ENCOUNTER — Other Ambulatory Visit (HOSPITAL_COMMUNITY): Payer: Self-pay

## 2024-05-03 ENCOUNTER — Encounter: Payer: Self-pay | Admitting: Family Medicine

## 2024-05-03 VITALS — BP 120/82 | HR 81 | Temp 97.5°F | Ht 62.0 in | Wt 132.6 lb

## 2024-05-03 DIAGNOSIS — E1169 Type 2 diabetes mellitus with other specified complication: Secondary | ICD-10-CM | POA: Diagnosis not present

## 2024-05-03 DIAGNOSIS — E1143 Type 2 diabetes mellitus with diabetic autonomic (poly)neuropathy: Secondary | ICD-10-CM

## 2024-05-03 DIAGNOSIS — L308 Other specified dermatitis: Secondary | ICD-10-CM

## 2024-05-03 DIAGNOSIS — M8580 Other specified disorders of bone density and structure, unspecified site: Secondary | ICD-10-CM | POA: Diagnosis not present

## 2024-05-03 DIAGNOSIS — R7989 Other specified abnormal findings of blood chemistry: Secondary | ICD-10-CM

## 2024-05-03 DIAGNOSIS — Z7984 Long term (current) use of oral hypoglycemic drugs: Secondary | ICD-10-CM | POA: Diagnosis not present

## 2024-05-03 DIAGNOSIS — Z0000 Encounter for general adult medical examination without abnormal findings: Secondary | ICD-10-CM | POA: Diagnosis not present

## 2024-05-03 DIAGNOSIS — E785 Hyperlipidemia, unspecified: Secondary | ICD-10-CM | POA: Diagnosis not present

## 2024-05-03 DIAGNOSIS — J029 Acute pharyngitis, unspecified: Secondary | ICD-10-CM

## 2024-05-03 DIAGNOSIS — K760 Fatty (change of) liver, not elsewhere classified: Secondary | ICD-10-CM

## 2024-05-03 DIAGNOSIS — R112 Nausea with vomiting, unspecified: Secondary | ICD-10-CM

## 2024-05-03 MED ORDER — PROMETHAZINE HCL 25 MG PO TABS: 25.0000 mg | ORAL_TABLET | Freq: Four times a day (QID) | ORAL | 10 refills | Status: AC | PRN

## 2024-05-03 MED ORDER — VALACYCLOVIR HCL 500 MG PO TABS: 500.0000 mg | ORAL_TABLET | Freq: Two times a day (BID) | ORAL | 3 refills | Status: AC | PRN

## 2024-05-03 MED ORDER — OXCARBAZEPINE 600 MG PO TABS: 600.0000 mg | ORAL_TABLET | Freq: Every day | ORAL | 3 refills | Status: AC

## 2024-05-03 MED ORDER — ONETOUCH VERIO VI STRP: ORAL_STRIP | 11 refills | Status: DC

## 2024-05-03 MED ORDER — TRIAMCINOLONE ACETONIDE 0.1 % EX CREA: TOPICAL_CREAM | Freq: Two times a day (BID) | CUTANEOUS | 10 refills | Status: AC

## 2024-05-03 NOTE — Progress Notes (Signed)
 "  Subjective:    Patient ID: Jacqueline [name], female    DOB: Jan 18, 1966, 58 y.o.   MRN: [id number]  HPI Patient is a 58 year old Caucasian female who presents today for complete physical exam.  Past medical history is significant for metabolic associated liver dysfunction, type 2 diabetes mellitus, metabolic syndrome.  However the patient has lost considerable weight.  She has lost almost 60 pounds in total currently on Mounjaro . Wt Readings from Last 3 Encounters:  05/03/24 132 lb 9.6 oz (60.1 kg)  09/01/23 192 lb 9.6 oz (87.4 kg)  05/22/23 190 lb 12.8 oz (86.5 kg)   I am extremely proud of her for this.  Most recent lab work is listed below and does show an elevation in her creatinine and hypokalemia.  Apparently she is taking 40 mg of Lasix  daily for pseudotumor cerebri under the direction of her neurologist.  However her labs suggest dehydration.  Her mammogram is up-to-date.  Her colonoscopy is up-to-date.  She does not require Pap smear due to the fact she has had a hysterectomy.  Her bone density was 3 years ago and showed borderline osteoporosis.  She is due to recheck that. Scanned Document on 05/03/2024  Component Date Value Ref Range Status   EGFR 04/05/2024 71.0   Final   Abstracted by HIM   Creatinine, POC 04/05/2024 24.1  mg/dL Final   Albumin, Urine POC 04/05/2024 5.0   Final   Microalb Creat Ratio 04/05/2024 21   Final  Lab on 04/28/2024  Component Date Value Ref Range Status   WBC 04/28/2024 6.2  3.8 - 10.8 Thousand/uL Final   RBC 04/28/2024 5.04  3.80 - 5.10 Million/uL Final   Hemoglobin 04/28/2024 14.8  11.7 - 15.5 g/dL Final   HCT 87/82/7974 44.6  35.9 - 46.0 % Final   MCV 04/28/2024 88.5  81.4 - 101.7 fL Final   MCH 04/28/2024 29.4  27.0 - 33.0 pg Final   MCHC 04/28/2024 33.2  31.6 - 35.4 g/dL Final   RDW 87/82/7974 12.4  11.0 - 15.0 % Final   Platelets 04/28/2024 378  140 - 400 Thousand/uL Final   MPV 04/28/2024 10.4  7.5 - 12.5 fL Final   Neutro Abs 04/28/2024  2,809  1,500 - 7,800 cells/uL Final   Absolute Lymphocytes 04/28/2024 2,778  850 - 3,900 cells/uL Final   Absolute Monocytes 04/28/2024 453  200 - 950 cells/uL Final   Eosinophils Absolute 04/28/2024 143  15 - 500 cells/uL Final   Basophils Absolute 04/28/2024 19  0 - 200 cells/uL Final   Neutrophils Relative % 04/28/2024 45.3  % Final   Total Lymphocyte 04/28/2024 44.8  % Final   Monocytes Relative 04/28/2024 7.3  % Final   Eosinophils Relative 04/28/2024 2.3  % Final   Basophils Relative 04/28/2024 0.3  % Final   Glucose, Bld 04/28/2024 112 (H)  65 - 99 mg/dL Final   Comment: .            Fasting reference interval . For someone without known diabetes, a glucose value between 100 and 125 mg/dL is consistent with prediabetes and should be confirmed with a follow-up test. .    BUN 04/28/2024 16  7 - 25 mg/dL Final   Creat 87/82/7974 1.39 (H)  0.50 - 1.03 mg/dL Final   BUN/Creatinine Ratio 04/28/2024 12  6 - 22 (calc) Final   Sodium 04/28/2024 138  135 - 146 mmol/L Final   Potassium 04/28/2024 3.3 (L)  3.5 - 5.3 mmol/L  Final   Chloride 04/28/2024 89 (L)  98 - 110 mmol/L Final   CO2 04/28/2024 34 (H)  20 - 32 mmol/L Final   Calcium  04/28/2024 9.2  8.6 - 10.4 mg/dL Final   Total Protein 87/82/7974 7.5  6.1 - 8.1 g/dL Final   Albumin 87/82/7974 4.9  3.6 - 5.1 g/dL Final   Globulin 87/82/7974 2.6  1.9 - 3.7 g/dL (calc) Final   AG Ratio 04/28/2024 1.9  1.0 - 2.5 (calc) Final   Total Bilirubin 04/28/2024 0.4  0.2 - 1.2 mg/dL Final   Alkaline phosphatase (APISO) 04/28/2024 83  37 - 153 U/L Final   AST 04/28/2024 15  10 - 35 U/L Final   ALT 04/28/2024 8  6 - 29 U/L Final   Cholesterol 04/28/2024 144  <200 mg/dL Final   HDL 87/82/7974 41 (L)  > OR = 50 mg/dL Final   Triglycerides 87/82/7974 252 (H)  <150 mg/dL Final   Comment: . If a non-fasting specimen was collected, consider repeat triglyceride testing on a fasting specimen if clinically indicated.  Veatrice et al. J. of Clin.  Lipidol. 2015;9:129-169. SABRA    LDL Cholesterol (Calc) 04/28/2024 70  mg/dL (calc) Final   Comment: Reference range: <100 . Desirable range <100 mg/dL for primary prevention;   <70 mg/dL for patients with CHD or diabetic patients  with > or = 2 CHD risk factors. SABRA LDL-C is now calculated using the Martin-Hopkins  calculation, which is a validated novel method providing  better accuracy than the Friedewald equation in the  estimation of LDL-C.  Gladis APPLETHWAITE et al. SANDREA. 7986;689(80): 2061-2068  (http://education.[facility].com/faq/FAQ164)    Total CHOL/HDL Ratio 04/28/2024 3.5  <4.9 (calc) Final   Non-HDL Cholesterol (Calc) 04/28/2024 103  <130 mg/dL (calc) Final   Comment: For patients with diabetes plus 1 major ASCVD risk  factor, treating to a non-HDL-C goal of <100 mg/dL  (LDL-C of <29 mg/dL) is considered a therapeutic  option.    Vitamin B-12 04/28/2024 366  200 - 1,100 pg/mL Final   Comment: . Please Note: Although the reference range for vitamin B12 is [phone] pg/mL, it has been reported that between 5 and 10% of patients with values between 200 and 400 pg/mL may experience neuropsychiatric and hematologic abnormalities due to occult B12 deficiency; less than 1% of patients with values above 400 pg/mL will have symptoms. .    Creatinine, Urine 04/28/2024 229  20 - 275 mg/dL Final   Microalb, Ur 87/82/7974 20.0  mg/dL Final   Comment: Reference Range Not established    Microalb Creat Ratio 04/28/2024 87 (H)  <30 mg/g creat Final   Comment: . The ADA defines abnormalities in albumin excretion as follows: SABRA Albuminuria Category        Result (mg/g creatinine) . Normal to Mildly increased   <30 Moderately increased         30-299  Severely increased           > OR = 300 . The ADA recommends that at least two of three specimens collected within a 3-6 month period be abnormal before considering a patient to be within a diagnostic category.     Immunization  History  Administered Date(s) Administered   Influenza, Seasonal, Injecte, Preservative Fre 01/21/2023, 02/23/2024   Influenza,inj,Quad PF,6+ Mos 02/02/2016, 04/29/2017, 02/06/2018, 02/26/2019, 03/02/2020, 02/21/2021, 03/06/2022   PFIZER(Purple Top)SARS-COV-2 Vaccination 07/31/2019, 08/21/2019, 02/24/2020, 11/24/2020   PNEUMOCOCCAL CONJUGATE-20 02/07/2022   Pfizer Covid-19 Vaccine Bivalent Booster 33yrs & up 03/08/2021  Pfizer(Comirnaty )Fall Seasonal Vaccine 12 years and older 03/06/2022, 01/21/2023, 02/23/2024   Pneumococcal Polysaccharide-23 10/24/2014, 02/21/2021   Tdap 12/17/2011, 02/07/2022   Zoster Recombinant(Shingrix ) 03/30/2021, 07/06/2021     Past Medical History:  Diagnosis Date   Abnormal transaminases    Allergy    Phreesia 03/01/2020   Anemia    Anxiety disorder    Arthritis    Asthma    Cancer (HCC)    [facility]reesia 03/01/2020   Cellulitis    Chronic headaches    Diabetes mellitus without complication (HCC)    Phreesia 03/01/2020   Diverticulitis    DM (diabetes mellitus) (HCC)    Erosive esophagitis    Fatty liver    GERD (gastroesophageal reflux disease)    Hyperlipidemia    [facility]reesia 03/01/2020   IBS (irritable bowel syndrome)    Kidney stones    Neuromuscular disorder (HCC)    Phreesia 03/01/2020   Obesity    Pneumonia    Pseudotumor cerebri    Sinus tachycardia    Past Surgical History:  Procedure Laterality Date   ABDOMINAL HYSTERECTOMY N/A    [facility]reesia 03/01/2020   ANKLE SURGERY     COLONOSCOPY  06/19/2009   COLONOSCOPY WITH PROPOFOL  N/A 03/29/2020   Procedure: COLONOSCOPY WITH PROPOFOL ;  Surgeon: Eartha Angelia Sieving, MD;  Location: [facility];  Service: Gastroenterology;  Laterality: N/A;  9:00   EGD  06/09/2009   FOOT SURGERY     NASAL SEPTUM SURGERY     ovarian cysts     x3   TONSILLECTOMY     TUBAL LIGATION     VAGINAL HYSTERECTOMY     Current Outpatient Medications on File Prior to Visit  Medication Sig Dispense Refill    aspirin EC 81 MG tablet Take 81 mg by mouth daily.     blood glucose meter kit and supplies KIT Dispense based on patient and insurance preference. Use up to four times daily as directed. 1 each 0   Blood Glucose Monitoring Suppl (BLOOD GLUCOSE SYSTEM PAK) KIT Please dispense based on patient and insurance preference. Use as directed to monitor fasting blood sugar twice daily. 1 kit 1   cetirizine (ZYRTEC) 10 MG tablet Take 10 mg by mouth daily.     COVID-19 mRNA vaccine 2023-2024 (COMIRNATY ) syringe Inject into the muscle. 0.3 mL 0   DULoxetine  (CYMBALTA ) 60 MG capsule TAKE TWO CAPSULES BY MOUTH ONCE DAILY 60 capsule 2   EPINEPHrine  0.3 mg/0.3 mL IJ SOAJ injection Inject 0.3 mg into the muscle as needed for anaphylaxis. 2 each 0   furosemide  (LASIX ) 20 MG tablet Take 1 tablet (20 mg total) by mouth 2 (two) times daily. 180 tablet 1   gabapentin  (NEURONTIN ) 600 MG tablet TAKE ONE-HALF TO TWO TABLETS BY MOUTH THREE TIMES DAILY AS NEEDED 540 tablet 3   glucose blood (ACCU-CHEK GUIDE) test strip USE AS DIRECTED TWICE DAILY TO check blood sugar 100 strip 11   glucose blood (ONETOUCH VERIO) test strip Use as directed twice daily to check blood sugar. 100 each 11   Lancets (ONETOUCH DELICA PLUS LANCET33G) MISC 1 each by Does not apply route 2 (two) times daily. 200 each 11   Lancets (ONETOUCH DELICA PLUS LANCET33G) MISC Use to test blood sugar twice daily. 200 each 11   Lancets (ONETOUCH DELICA PLUS LANCET33G) MISC Use to test blood sugar twice daily. 200 each 11   metFORMIN  (GLUCOPHAGE ) 1000 MG tablet Take 1 tablet (1,000 mg total) by mouth 2 (two) times daily with  a meal. 180 tablet 1   metoprolol  succinate (TOPROL -XL) 50 MG 24 hr tablet Take 1 tablet (50 mg total) by mouth daily. Take with or immediately following a meal. 90 tablet 1   oxcarbazepine  (TRILEPTAL ) 600 MG tablet Take 1 tablet (600 mg total) by mouth daily. 30 tablet 10   pantoprazole  (PROTONIX ) 40 MG tablet Take 1 tablet (40 mg total) by  mouth daily. 90 tablet 1   Polyethyl Glycol-Propyl Glycol (SYSTANE OP) Place 1 drop into both eyes daily as needed (dry eyes).     promethazine  (PHENERGAN ) 25 MG tablet Take 1 tablet (25 mg total) by mouth every 6 (six) hours as needed for nausea and vomiting. 30 tablet 10   rosuvastatin  (CRESTOR ) 20 MG tablet Take 1 tablet (20 mg total) by mouth daily. 90 tablet 1   tirzepatide  (MOUNJARO ) 12.5 MG/0.5ML Pen Inject 12.5 mg into the skin once a week. 2 mL 1   triamcinolone  cream (KENALOG ) 0.1 % APPLY TO AFFECTED AREA(S) TWICE DAILY AS NEEDED FOR ECZEMA 80 g 10   valACYclovir  (VALTREX ) 500 MG tablet Take 1 tablet (500 mg total) by mouth 2 (two) times daily as needed. 6 tablet 3   Flaxseed, Linseed, (FLAXSEED OIL PO) Take 1 capsule by mouth daily.     No current facility-administered medications on file prior to visit.   Allergies  Allergen Reactions   Bee Venom Anaphylaxis   Aspirin Hives, Itching and Swelling   Metoclopramide Hives   Codeine     Increases liver enzymes    Eql Antibacterial Hand Soap [Benzalkonium Chloride] Hives   Nsaids     Hallucinations    Social History   Socioeconomic History   Marital status: Married    Spouse name: Charles   Number of children: 3   Years of education: Not on file   Highest education level: GED or equivalent  Occupational History   Occupation: Disabled  Tobacco Use   Smoking status: Former   Smokeless tobacco: Never  Advertising Account Planner   Vaping status: Never Used  Substance and Sexual Activity   Alcohol use: No   Drug use: No   Sexual activity: Yes    Birth control/protection: Surgical  Other Topics Concern   Not on file  Social History Narrative   Lives with husband and children.    Social Drivers of Health   Tobacco Use: Medium Risk (05/22/2023)   Patient History    Smoking Tobacco Use: Former    Smokeless Tobacco Use: Never    Passive Exposure: Not on file  Financial Resource Strain: Low Risk (05/03/2024)   Overall Financial  Resource Strain (CARDIA)    Difficulty of Paying Living Expenses: Not hard at all  Food Insecurity: Unknown (05/03/2024)   Epic    Worried About Programme Researcher, Broadcasting/film/video in the Last Year: Not on file    [employer] of Food in the Last Year: Never true  Transportation Needs: No Transportation Needs (05/03/2024)   Epic    Lack of Transportation (Medical): No    Lack of Transportation (Non-Medical): No  Physical Activity: Insufficiently Active (05/03/2024)   Exercise Vital Sign    Days of Exercise per Week: 3 days    Minutes of Exercise per Session: 30 min  Stress: Stress Concern Present (05/03/2024)   [employer] of Occupational Health - Occupational Stress Questionnaire    Feeling of Stress: Rather much  Social Connections: Moderately Isolated (05/03/2024)   Social Connection and Isolation Panel    Frequency of  Communication with Friends and Family: More than three times a week    Frequency of Social Gatherings with Friends and Family: Three times a week    Attends Religious Services: Never    Active Member of Clubs or Organizations: No    Attends Banker Meetings: Not on file    Marital Status: Married  Catering Manager Violence: Not At Risk (05/22/2022)   Humiliation, Afraid, Rape, and Kick questionnaire    Fear of Current or Ex-Partner: No    Emotionally Abused: No    Physically Abused: No    Sexually Abused: No  Depression (PHQ2-9): Low Risk (05/03/2024)   Depression (PHQ2-9)    PHQ-2 Score: 0  Alcohol Screen: Low Risk (05/22/2022)   Alcohol Screen    Last Alcohol Screening Score (AUDIT): 0  Housing: Low Risk (05/03/2024)   Epic    Unable to Pay for Housing in the Last Year: No    Number of Times Moved in the Last Year: 0    Homeless in the Last Year: No  Utilities: Not At Risk (05/22/2022)   [facility] Utilities    Threatened with loss of utilities: No  Health Literacy: Not on file   Family History  Problem Relation Age of Onset   Diabetes Mother    Diabetes  Maternal Aunt    Diabetes Son    Colitis Other    Crohn's disease Other    Breast cancer Maternal Aunt    Ovarian cancer Other    Celiac disease Other    Clotting disorder Other    Cystic fibrosis Other        pat. cousin   Heart disease Other    Irritable bowel syndrome Other    Kidney failure Other 31       mat. nephew   Diabetes Other        mat. nephew   Colon cancer Neg Hx       Review of Systems  All other systems reviewed and are negative.      Objective:   Physical Exam Vitals reviewed.  Constitutional:      General: She is not in acute distress.    Appearance: She is well-developed. She is not diaphoretic.  HENT:     Head: Normocephalic and atraumatic.     Right Ear: External ear normal.     Left Ear: External ear normal.     Nose: Nose normal.     Mouth/Throat:     Pharynx: No oropharyngeal exudate.  Eyes:     General: No scleral icterus.       Right eye: No discharge.        Left eye: No discharge.     Conjunctiva/sclera: Conjunctivae normal.     Pupils: Pupils are equal, round, and reactive to light.  Neck:     Thyroid : No thyromegaly.     Vascular: No JVD.  Cardiovascular:     Rate and Rhythm: Normal rate and regular rhythm.     Heart sounds: Normal heart sounds. No murmur heard.    No friction rub. No gallop.  Pulmonary:     Effort: Pulmonary effort is normal. No respiratory distress.     Breath sounds: Normal breath sounds. No stridor. No wheezing or rales.  Chest:     Chest wall: No tenderness.  Abdominal:     General: Bowel sounds are normal. There is no distension.     Palpations: Abdomen is soft. There is no mass.  Tenderness: There is no abdominal tenderness. There is no guarding or rebound.  Musculoskeletal:        General: No tenderness. Normal range of motion.     Cervical back: Normal range of motion and neck supple.  Lymphadenopathy:     Cervical: No cervical adenopathy.  Skin:    General: Skin is warm.     Coloration:  Skin is not pale.     Findings: No erythema or rash.  Neurological:     Mental Status: She is alert and oriented to person, place, and time.     Cranial Nerves: No cranial nerve deficit.     Motor: No abnormal muscle tone.     Coordination: Coordination normal.     Deep Tendon Reflexes: Reflexes are normal and symmetric.  Psychiatric:        Behavior: Behavior normal.        Thought Content: Thought content normal.        Judgment: Judgment normal.           Assessment & Plan:  Osteopenia, unspecified location - Plan: DG Bone Density  General medical exam  NAFLD (nonalcoholic fatty liver disease)  Type 2 diabetes mellitus with diabetic autonomic neuropathy, without long-term current use of insulin  (HCC)  Dyslipidemia associated with type 2 diabetes mellitus (HCC) Physical exam today is completely normal.  Blood pressure is excellent.  Recommended the RSV vaccine the patient.  Flu shot, pneumonia shot, and shingles shot are up-to-date.  Colonoscopy is up-to-date.  Mammogram is up-to-date.  Pap smear is not necessary.  I will schedule the patient for a bone density test.  Hemoglobin A1c was recently found to be 6.2.  I believe that the change in her lab work is due to dehydration and prerenal azotemia.  Her creatinine is elevated at 1.3 and her potassium is low.  I suggested that she reduce her Lasix  to 1 tablet a day and repeat a BMP in 2 weeks.  I believe she is dehydrated. "

## 2024-05-17 ENCOUNTER — Other Ambulatory Visit

## 2024-05-17 DIAGNOSIS — R7989 Other specified abnormal findings of blood chemistry: Secondary | ICD-10-CM

## 2024-05-18 ENCOUNTER — Ambulatory Visit: Payer: Self-pay | Admitting: Family Medicine

## 2024-05-18 LAB — BASIC METABOLIC PANEL WITHOUT GFR
BUN/Creatinine Ratio: 15 (calc) (ref 6–22)
BUN: 19 mg/dL (ref 7–25)
CO2: 29 mmol/L (ref 20–32)
Calcium: 9.4 mg/dL (ref 8.6–10.4)
Chloride: 96 mmol/L — ABNORMAL LOW (ref 98–110)
Creat: 1.24 mg/dL — ABNORMAL HIGH (ref 0.50–1.03)
Glucose, Bld: 99 mg/dL (ref 65–99)
Potassium: 4.6 mmol/L (ref 3.5–5.3)
Sodium: 137 mmol/L (ref 135–146)

## 2024-05-19 ENCOUNTER — Ambulatory Visit (HOSPITAL_COMMUNITY)
Admission: RE | Admit: 2024-05-19 | Discharge: 2024-05-19 | Disposition: A | Discharge status: Home / Self Care | Source: Ambulatory Visit | Attending: Family Medicine | Admitting: Family Medicine

## 2024-05-19 DIAGNOSIS — M810 Age-related osteoporosis without current pathological fracture: Secondary | ICD-10-CM | POA: Diagnosis not present

## 2024-05-19 DIAGNOSIS — M8580 Other specified disorders of bone density and structure, unspecified site: Secondary | ICD-10-CM | POA: Diagnosis present

## 2024-05-20 ENCOUNTER — Other Ambulatory Visit: Payer: Self-pay

## 2024-05-20 MED ORDER — ALENDRONATE SODIUM 70 MG PO TABS: 70.0000 mg | ORAL_TABLET | ORAL | 11 refills | Status: AC

## 2024-05-20 NOTE — Progress Notes (Signed)
 Jacqueline [name]                                          MRN: [id number]   05/20/2024   The [facility] Quality Team Specialist reviewed this patient medical record for the purposes of chart review for care gap closure. The following were reviewed: abstraction for care gap closure-glycemic status assessment.    [facility] Quality Team

## 2024-05-20 NOTE — Progress Notes (Signed)
 Jacqueline [name]                                          MRN: [id number]   05/20/2024   The [facility] Quality Team Specialist reviewed this patient medical record for the purposes of chart review for care gap closure. The following were reviewed: abstraction for care gap closure-controlling blood pressure.    [facility] Quality Team

## 2024-05-25 NOTE — Progress Notes (Signed)
 Kaida C Biggar                                          MRN: [id number]   05/25/2024   The [facility] Quality Team Specialist reviewed this patient medical record for the purposes of chart review for care gap closure. The following were reviewed: abstraction for care gap closure-kidney health evaluation for diabetes:eGFR  and uACR.    [facility] Quality Team

## 2024-06-11 ENCOUNTER — Encounter: Payer: Self-pay | Admitting: Family Medicine

## 2024-06-11 ENCOUNTER — Ambulatory Visit: Admitting: Family Medicine

## 2024-06-11 ENCOUNTER — Ambulatory Visit: Payer: Self-pay

## 2024-06-11 VITALS — BP 142/100 | HR 88 | Ht 62.0 in | Wt 136.0 lb

## 2024-06-11 DIAGNOSIS — R319 Hematuria, unspecified: Secondary | ICD-10-CM | POA: Diagnosis not present

## 2024-06-11 DIAGNOSIS — N390 Urinary tract infection, site not specified: Secondary | ICD-10-CM

## 2024-06-11 DIAGNOSIS — R300 Dysuria: Secondary | ICD-10-CM

## 2024-06-11 LAB — URINALYSIS, ROUTINE W REFLEX MICROSCOPIC
Bilirubin Urine: NEGATIVE
Glucose, UA: NEGATIVE
Hyaline Cast: NONE SEEN /LPF
Ketones, ur: NEGATIVE
Nitrite: NEGATIVE
RBC / HPF: >=60 /HPF — AB (ref 0–2)
Specific Gravity, Urine: 1.02 (ref 1.001–1.035)
pH: 6 (ref 5.0–8.0)

## 2024-06-11 LAB — MICROSCOPIC MESSAGE

## 2024-06-11 MED ORDER — NITROFURANTOIN MONOHYD MACRO 100 MG PO CAPS: 100.0000 mg | ORAL_CAPSULE | Freq: Two times a day (BID) | ORAL | 0 refills | Status: AC

## 2024-06-11 MED ORDER — PHENAZOPYRIDINE HCL 100 MG PO TABS: 100.0000 mg | ORAL_TABLET | Freq: Three times a day (TID) | ORAL | 0 refills | Status: AC | PRN

## 2024-06-11 NOTE — Telephone Encounter (Signed)
 FYI Only or Action Required?: FYI only for provider: appointment scheduled on 06/11/24.  Patient was last seen in primary care on 05/03/2024 by Duanne Butler DASEN, MD.  Called Nurse Triage reporting Hematuria, Back Pain, and Dysuria.  Symptoms began today.  Interventions attempted: Nothing.  Symptoms are: gradually worsening.  Triage Disposition: See HCP Within 4 Hours (Or PCP Triage)  Patient/caregiver understands and will follow disposition?: Yes                Message from Amber H sent at 06/11/2024 10:03 AM EST  Reason for Triage: Patient c/o burning sensation in vaginal area, blood in urine, and discomfort on left side of back. She sent a message to provider requesting an antibiotic be sent into [place].   Reason for Disposition  Side (flank) or lower back pain present  Answer Assessment - Initial Assessment Questions Wants antibiotic called in. Advised visit with a provider if she is requesting an antibiotic and thinks it is a UTI. Patient agreeable.   1. SEVERITY: How bad is the pain?  (e.g., Scale 1-10; mild, moderate, or severe)     8/10. Nothing OTC taken for pain. Feels like razor blades  2. FREQUENCY: How many times have you had painful urination today?      8.  3. PATTERN: Is pain present every time you urinate or just sometimes?      Every time.  4. ONSET: When did the painful urination start?      This morning since about 0430.  5. FEVER: Do you have a fever? If Yes, ask: What is your temperature, how was it measured, and when did it start?     No.  6. PAST UTI: Have you had a urine infection before? If Yes, ask: When was the last time? and What happened that time?      Has had UTI in the past, thinks about 3 years ago since last UTI.  7. CAUSE: What do you think is causing the painful urination?  (e.g., UTI, scratch, Herpes sore)     UTI  8. OTHER SYMPTOMS: Do you have any other symptoms? (e.g., blood in  urine, flank pain, genital sores, urgency, vaginal discharge)     Left sided lower back pain, light pink blood on toilet tissue. No vomiting, fever.  Protocols used: Urination Pain - Female-A-AH

## 2024-06-11 NOTE — Progress Notes (Signed)
 "  Patient Office Visit  Assessment & Plan:  Urinary tract infection with hematuria, site unspecified -     Urine Culture -     Urinalysis, Routine w reflex microscopic -     Nitrofurantoin  Monohyd Macro; Take 1 capsule (100 mg total) by mouth 2 (two) times daily for 7 days.  Dispense: 14 capsule; Refill: 0 -     Phenazopyridine  HCl; Take 1 tablet (100 mg total) by mouth 3 (three) times daily as needed for pain.  Dispense: 30 tablet; Refill: 0 -     Microscopic Message  Dysuria -     Urine Culture -     Urinalysis, Routine w reflex microscopic -     Microscopic Message   Assessment and Plan    Acute urinary tract infection with hematuria and dysuria Acute UTI with hematuria and severe dysuria. Urinalysis shows significant WBCs and blood. Culture pending. - Prescribed Macrobid  for 7 days. - Prescribed Pyridium  for dysuria relief. - Advised 6-8 cups of water daily. - Instructed to report worsening symptoms. - Sent urine culture for analysis.       Return if symptoms worsen or fail to improve.   Subjective:    Patient ID: Jacqueline [name], female    DOB: October 26, 1965  Age: 59 y.o. MRN: [id number]  Chief Complaint  Patient presents with   Dysuria    Burning with urination and pt noticed blood in her urine. Symptoms started this morning.Patient states that she has urinated around 11 times since 4am this morning.     HPI Discussed the use of AI scribe software for clinical note transcription with the patient, who gave verbal consent to proceed.  History of Present Illness        Jacqueline [name] is a 59 year old female with a history of kidney stones who presents with acute urinary symptoms.  Her symptoms began suddenly at 4:30 AM today, described as 'hit you like a ton of bricks'. She experiences severe burning pain during urination, likened to 'peeing razor blades'. No nausea, vomiting, fever, or chills. She reports soreness in her back.  She has a history of kidney stones but  notes that this episode does not feel like previous kidney stone episodes, which she compares to 'having a baby'. She recalls a similar episode two to three years ago that led to an ER visit, initially suspected to be appendicitis due to the severity of pain, but was diagnosed as a urinary tract infection after extensive testing.  She is currently taking Mounjaro . Her blood sugar levels have been stable, with a reading of 96 this morning.  She has been drinking large amounts of water, approximately two 40-ounce cups, in an attempt to flush out the infection, although this increases her frequency of urination.  Physical Exam ABDOMEN: Tenderness on the left side of the abdomen.  Results Labs Urinalysis (06/11/2024): Pyuria and hematuria Serum creatinine (04/2024): Mildly abnormal, improved on repeat measurement  Assessment and Plan Acute urinary tract infection with hematuria and dysuria Acute UTI with hematuria and severe dysuria. Urinalysis shows significant WBCs and blood. Culture pending. - Prescribed Macrobid  for 7 days. - Prescribed Pyridium  for dysuria relief. - Advised 6-8 cups of water daily. - Instructed to report worsening symptoms. - Sent urine culture for analysis.  The 10-year ASCVD risk score (Arnett DK, et al., 2019) is: 6.5%  Past Medical History:  Diagnosis Date   Abnormal transaminases    Allergy    [employer]reesia 03/01/2020  Anemia    Anxiety disorder    Arthritis    Asthma    Cancer (HCC)    [employer]reesia 03/01/2020   Cellulitis    Chronic headaches    Diabetes mellitus without complication (HCC)    [employer]reesia 03/01/2020   Diverticulitis    DM (diabetes mellitus) (HCC)    Erosive esophagitis    Fatty liver    GERD (gastroesophageal reflux disease)    Hyperlipidemia    [employer]ia 03/01/2020   IBS (irritable bowel syndrome)    Kidney stones    Neuromuscular disorder (HCC)    [employer]reesia 03/01/2020   Obesity    Pneumonia    Pseudotumor cerebri    Sinus tachycardia     Past Surgical History:  Procedure Laterality Date   ABDOMINAL HYSTERECTOMY N/A    [employer]ia 03/01/2020   ANKLE SURGERY     COLONOSCOPY  06/19/2009   COLONOSCOPY WITH PROPOFOL  N/A 03/29/2020   Procedure: COLONOSCOPY WITH PROPOFOL ;  Surgeon: Eartha Angelia Sieving, MD;  Location: [facility];  Service: Gastroenterology;  Laterality: N/A;  9:00   EGD  06/09/2009   FOOT SURGERY     NASAL SEPTUM SURGERY     ovarian cysts     x3   TONSILLECTOMY     TUBAL LIGATION     VAGINAL HYSTERECTOMY     Social History[1] Family History  Problem Relation Age of Onset   Diabetes Mother    Diabetes Maternal Aunt    Diabetes Son    Colitis Other    Crohn's disease Other    Breast cancer Maternal Aunt    Ovarian cancer Other    Celiac disease Other    Clotting disorder Other    Cystic fibrosis Other        pat. cousin   Heart disease Other    Irritable bowel syndrome Other    Kidney failure Other 31       mat. nephew   Diabetes Other        mat. nephew   Colon cancer Neg Hx    Allergies[2]  ROS    Objective:    BP (!) 142/100   Pulse 88   Ht 5' 2 (1.575 m)   Wt 136 lb (61.7 kg)   SpO2 94%   BMI 24.87 kg/m  BP Readings from Last 3 Encounters:  06/11/24 (!) 142/100  05/03/24 120/82  09/01/23 132/76   Wt Readings from Last 3 Encounters:  06/11/24 136 lb (61.7 kg)  05/03/24 132 lb 9.6 oz (60.1 kg)  09/01/23 192 lb 9.6 oz (87.4 kg)    Physical Exam Vitals and nursing note reviewed.  Constitutional:      General: She is not in acute distress. HENT:     Head: Normocephalic.     Right Ear: Tympanic membrane normal.     Left Ear: Tympanic membrane normal.  Cardiovascular:     Rate and Rhythm: Normal rate and regular rhythm.  Pulmonary:     Breath sounds: No wheezing.  Abdominal:     General: Bowel sounds are normal.     Tenderness: There is abdominal tenderness in the right lower quadrant. There is no right CVA tenderness, left CVA tenderness or guarding.      Comments: Left lower abdominal tenderness  Skin:    Findings: No rash.      Results for orders placed or performed in visit on 06/11/24  Urinalysis, Routine w reflex microscopic  Result Value Ref Range   Color, Urine BLOODY,CLOUDY (  A) YELLOW   APPearance CLOUDY (A) CLEAR   Specific Gravity, Urine 1.020 1.001 - 1.035   pH 6.0 5.0 - 8.0   Glucose, UA NEGATIVE NEGATIVE   Bilirubin Urine NEGATIVE NEGATIVE   Ketones, ur NEGATIVE NEGATIVE   Hgb urine dipstick 3+ (A) NEGATIVE   Protein, ur 3+ (A) NEGATIVE   Nitrite NEGATIVE NEGATIVE   Leukocytes,Ua 3+ (A) NEGATIVE   WBC, UA 40-60 (A) 0 - 5 /HPF   RBC / HPF > OR = 60 (A) 0 - 2 /HPF   Squamous Epithelial / HPF 0-5 < OR = 5 /HPF   Bacteria, UA FEW (A) NONE SEEN /HPF   Hyaline Cast NONE SEEN NONE SEEN /LPF  Microscopic Message  Result Value Ref Range   Note               [1]  Social History Tobacco Use   Smoking status: Former   Smokeless tobacco: Never  Vaping Use   Vaping status: Never Used  Substance Use Topics   Alcohol use: No   Drug use: No  [2]  Allergies Allergen Reactions   Bee Venom Anaphylaxis   Aspirin Hives, Itching and Swelling   Metoclopramide Hives   Codeine     Increases liver enzymes    Eql Antibacterial Hand Soap [Benzalkonium Chloride] Hives   Nsaids     Hallucinations    "

## 2024-06-14 ENCOUNTER — Ambulatory Visit: Payer: Self-pay | Admitting: Family Medicine

## 2024-06-14 LAB — URINE CULTURE
MICRO NUMBER:: 17532218
SPECIMEN QUALITY:: ADEQUATE

## 2024-06-15 ENCOUNTER — Other Ambulatory Visit: Payer: Self-pay

## 2024-06-15 ENCOUNTER — Telehealth: Payer: Self-pay | Admitting: Pharmacy Technician

## 2024-06-15 ENCOUNTER — Other Ambulatory Visit (HOSPITAL_COMMUNITY): Payer: Self-pay

## 2024-06-15 DIAGNOSIS — E0842 Diabetes mellitus due to underlying condition with diabetic polyneuropathy: Secondary | ICD-10-CM

## 2024-06-15 DIAGNOSIS — E1143 Type 2 diabetes mellitus with diabetic autonomic (poly)neuropathy: Secondary | ICD-10-CM

## 2024-06-15 DIAGNOSIS — E1169 Type 2 diabetes mellitus with other specified complication: Secondary | ICD-10-CM

## 2024-06-15 MED ORDER — LANCETS MISC: 3 refills | Status: AC

## 2024-06-15 MED ORDER — BLOOD GLUCOSE TEST VI STRP: ORAL_STRIP | 3 refills | Status: AC

## 2024-06-15 MED ORDER — BLOOD GLUCOSE MONITORING SUPPL DEVI: 0 refills | Status: AC

## 2024-06-15 MED ORDER — LANCET DEVICE MISC: 0 refills | Status: AC

## 2024-06-15 NOTE — Telephone Encounter (Signed)
 Pharmacy Patient Advocate Encounter   Received notification from [facility] KEY that prior authorization for Onetouch Verio test strip is required/requested.   Insurance verification completed.   The patient is insured through [facility] [employer].   Per test claim:  Accu-chek and Contour is preferred by the insurance.  If suggested medication is appropriate, Please send in a new RX and discontinue this one. If not, please advise as to why it's not appropriate so that we may request a Prior Authorization. Please note, some preferred medications may still require a PA.  If the suggested medications have not been trialed and there are no contraindications to their use, the PA will not be submitted, as it will not be approved. Archived Key: ESTELLE
